# Patient Record
Sex: Female | Born: 1965 | Race: White | Hispanic: No | Marital: Married | Smoking: Current every day smoker
Health system: Southern US, Community
[De-identification: ages and names within clinical notes are randomized; demographics above are authoritative.]

## PROBLEM LIST (undated history)

## (undated) DIAGNOSIS — K5090 Crohn's disease, unspecified, without complications: Secondary | ICD-10-CM

## (undated) DIAGNOSIS — F329 Major depressive disorder, single episode, unspecified: Secondary | ICD-10-CM

## (undated) DIAGNOSIS — M1990 Unspecified osteoarthritis, unspecified site: Secondary | ICD-10-CM

## (undated) DIAGNOSIS — H3530 Unspecified macular degeneration: Secondary | ICD-10-CM

## (undated) DIAGNOSIS — E039 Hypothyroidism, unspecified: Secondary | ICD-10-CM

## (undated) DIAGNOSIS — F4310 Post-traumatic stress disorder, unspecified: Secondary | ICD-10-CM

## (undated) DIAGNOSIS — E079 Disorder of thyroid, unspecified: Secondary | ICD-10-CM

## (undated) DIAGNOSIS — I10 Essential (primary) hypertension: Secondary | ICD-10-CM

## (undated) DIAGNOSIS — Z87442 Personal history of urinary calculi: Secondary | ICD-10-CM

## (undated) DIAGNOSIS — F32A Depression, unspecified: Secondary | ICD-10-CM

## (undated) DIAGNOSIS — E785 Hyperlipidemia, unspecified: Secondary | ICD-10-CM

## (undated) DIAGNOSIS — F419 Anxiety disorder, unspecified: Secondary | ICD-10-CM

## (undated) HISTORY — DX: Hyperlipidemia, unspecified: E78.5

## (undated) HISTORY — PX: ABDOMINAL HYSTERECTOMY: SHX81

## (undated) HISTORY — PX: CHOLECYSTECTOMY: SHX55

## (undated) HISTORY — PX: JOINT REPLACEMENT: SHX530

## (undated) HISTORY — DX: Essential (primary) hypertension: I10

## (undated) HISTORY — DX: Disorder of thyroid, unspecified: E07.9

## (undated) HISTORY — PX: APPENDECTOMY: SHX54

## (undated) HISTORY — DX: Unspecified macular degeneration: H35.30

## (undated) HISTORY — DX: Anxiety disorder, unspecified: F41.9

## (undated) HISTORY — PX: MEDIAL PARTIAL KNEE REPLACEMENT: SHX5965

## (undated) HISTORY — PX: EYE SURGERY: SHX253

---
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
## 2017-06-02 ENCOUNTER — Other Ambulatory Visit (HOSPITAL_COMMUNITY): Payer: Self-pay | Admitting: *Deleted

## 2017-06-02 ENCOUNTER — Ambulatory Visit (HOSPITAL_COMMUNITY)
Admission: RE | Admit: 2017-06-02 | Discharge: 2017-06-02 | Disposition: A | Discharge status: Home / Self Care | Payer: Self-pay | Source: Ambulatory Visit | Attending: *Deleted | Admitting: *Deleted

## 2017-06-02 DIAGNOSIS — W19XXXD Unspecified fall, subsequent encounter: Secondary | ICD-10-CM

## 2017-06-02 DIAGNOSIS — Y92009 Unspecified place in unspecified non-institutional (private) residence as the place of occurrence of the external cause: Secondary | ICD-10-CM | POA: Insufficient documentation

## 2017-06-02 DIAGNOSIS — W19XXXA Unspecified fall, initial encounter: Secondary | ICD-10-CM | POA: Insufficient documentation

## 2017-06-02 DIAGNOSIS — M25562 Pain in left knee: Secondary | ICD-10-CM | POA: Insufficient documentation

## 2017-06-02 DIAGNOSIS — Z96652 Presence of left artificial knee joint: Secondary | ICD-10-CM | POA: Insufficient documentation

## 2017-11-30 ENCOUNTER — Other Ambulatory Visit (HOSPITAL_COMMUNITY): Payer: Self-pay | Admitting: *Deleted

## 2017-11-30 DIAGNOSIS — E039 Hypothyroidism, unspecified: Secondary | ICD-10-CM

## 2017-12-05 ENCOUNTER — Ambulatory Visit (HOSPITAL_COMMUNITY)
Admission: RE | Admit: 2017-12-05 | Discharge: 2017-12-05 | Disposition: A | Discharge status: Home / Self Care | Payer: Self-pay | Source: Ambulatory Visit | Attending: *Deleted | Admitting: *Deleted

## 2017-12-05 DIAGNOSIS — E039 Hypothyroidism, unspecified: Secondary | ICD-10-CM | POA: Insufficient documentation

## 2018-01-31 ENCOUNTER — Encounter: Payer: Self-pay | Admitting: Internal Medicine

## 2018-02-13 ENCOUNTER — Encounter: Payer: Self-pay | Admitting: Gastroenterology

## 2018-02-13 ENCOUNTER — Ambulatory Visit (INDEPENDENT_AMBULATORY_CARE_PROVIDER_SITE_OTHER): Payer: Self-pay | Admitting: Gastroenterology

## 2018-02-13 DIAGNOSIS — K5090 Crohn's disease, unspecified, without complications: Secondary | ICD-10-CM

## 2018-02-13 NOTE — Assessment & Plan Note (Signed)
Very pleasant 52 year old female presenting for further management of Crohn's disease.  She reports being diagnosed with small bowel Crohn's disease about 13 years ago while living in [place].  She reports last colonoscopy about 3 years ago along with an EGD at the time.  She states it was not being done related to her Crohn's but she was found to have acid reflux and colon polyps.  She has mostly constipation with periods of diarrhea, intermittent vomiting, abdominal pain and bloating, rectal bleeding.  We have requested records from her last EGD and colonoscopy in [place].  She cannot recall where she was when she had the original ones done in [place] therefore these records are not going to be available.  Requested recent labs from the health department for review.  Once records have been reviewed, further recommendations to follow.

## 2018-02-13 NOTE — Patient Instructions (Signed)
1. We will work on getting your records. If you have not heard from our office by Thursday, please call and inquire, [phone].

## 2018-02-13 NOTE — Progress Notes (Signed)
cc'd to pcp 

## 2018-02-13 NOTE — Progress Notes (Signed)
Primary Care Physician:  Raiford Simmonds., PA-C  Primary Gastroenterologist:  Garfield Cornea, MD   Chief Complaint  Patient presents with  . Crohn's Disease    f/u.  Marland Kitchen diarrhea/constipation    varies  . Abdominal Pain    feels very bloated, upper abd    HPI:  Victoria Deleon is a 51 y.o. female here at the request of Royce Macadamia PA-C for further management of Crohn's disease.  Patient presents with her husband today.  They are fairly new to the area.  She reports being diagnosed with small bowel Crohn's disease about 13 years ago while in [place].  She said she had a colonoscopy and her small bowel look like "hamburger meat".  She states she was going to be started on occasion for Crohn's but she was scared to take it after a nurse advised against it and recommended aloe vera juice.  Patient reports changing her diet, consuming aloe vera juice and having repeat colonoscopy which showed improvement of the small bowel mucosa.  She continued this for years but over the past few years has had difficult to manage disease.  She reports having an EGD and colonoscopy about 3 years ago in [place], [place].  Says she had some colon polyps removed, advised to come back for colonoscopy in 5 years.  Also with acid reflux.  She states for several years she has been just dealing with the symptoms.  She has had lack of medical coverage which is limited her seeking care.  Complains of alternating constipation and diarrhea.  Mostly constipation.  However cites multiple times during the visit that she is limited as far as her activities due to the diarrhea.  She has a bowel movement about twice per week, last week had to use an enema.  Takes fiber daily.  Sometimes bright red blood per rectum.  No melena.  Complains of frequent severe reflux.  Has postprandial abdominal bloating.  Complains of doubling over type pain in the abdomen worse over the past year.  No weight loss.  She is vegan.  She notes if she  eats gluten she tends to pay for it.  Off of liquid diet.  She has vomiting about 4-5 times per month but more frequent nausea.  She is not sure if she is ever been screened for celiac disease.  She does not know her family history as she was adopted.  She admits to significant anxiety which prevents her from doing some of the activities she would like to do such as driving.   Current Outpatient Medications  Medication Sig Dispense Refill  . atorvastatin (LIPITOR) 40 MG tablet Take 1 tablet by mouth daily.  6  . busPIRone (BUSPAR) 10 MG tablet Take 10 mg by mouth 3 (three) times daily.  3  . citalopram (CELEXA) 20 MG tablet Take 20 mg by mouth daily.  3  . doxepin (SINEQUAN) 10 MG capsule Take 10 mg by mouth daily.  3  . FIBER PO Take by mouth daily.    Marland Kitchen levothyroxine (SYNTHROID, LEVOTHROID) 150 MCG tablet Take 1 tablet by mouth daily.  2  . Multiple Vitamins-Minerals (OCUVITE-LUTEIN PO) Take by mouth daily.    . polyethylene glycol (MIRALAX / GLYCOLAX) packet Take 17 g by mouth daily.     No current facility-administered medications for this visit.     Allergies as of 02/13/2018 - Review Complete 02/13/2018  Allergen Reaction Noted  . Codeine Anaphylaxis 06/02/2017    Past Medical History:  Diagnosis Date  . Anxiety   . Hypertension   . Macular degeneration     Past Surgical History:  Procedure Laterality Date  . ABDOMINAL HYSTERECTOMY    . APPENDECTOMY    . CHOLECYSTECTOMY    . MEDIAL PARTIAL KNEE REPLACEMENT Left     Family History  Adopted: Yes    Social History   Socioeconomic History  . Marital status: Married    Spouse name: Not on file  . Number of children: Not on file  . Years of education: Not on file  . Highest education level: Not on file  Occupational History  . Not on file  Social Needs  . Financial resource strain: Not on file  . Food insecurity:    Worry: Not on file    Inability: Not on file  . Transportation needs:    Medical: Not on  file    Non-medical: Not on file  Tobacco Use  . Smoking status: Current Every Day Smoker    Types: Cigarettes  . Smokeless tobacco: Never Used  Substance and Sexual Activity  . Alcohol use: Never    Frequency: Never  . Drug use: Never  . Sexual activity: Not on file  Lifestyle  . Physical activity:    Days per week: Not on file    Minutes per session: Not on file  . Stress: Not on file  Relationships  . Social connections:    Talks on phone: Not on file    Gets together: Not on file    Attends religious service: Not on file    Active member of club or organization: Not on file    Attends meetings of clubs or organizations: Not on file    Relationship status: Not on file  . Intimate partner violence:    Fear of current or ex partner: Not on file    Emotionally abused: Not on file    Physically abused: Not on file    Forced sexual activity: Not on file  Other Topics Concern  . Not on file  Social History Narrative  . Not on file      ROS:  General: Negative for  weight loss, fever, chills, fatigue, weakness. See hpi Eyes: Negative for vision changes.  ENT: Negative for hoarseness, difficulty swallowing , nasal congestion. CV: Negative for chest pain, angina, palpitations, dyspnea on exertion, peripheral edema.  Respiratory: Negative for dyspnea at rest, dyspnea on exertion, cough, sputum, wheezing.  GI: See history of present illness. GU:  Negative for dysuria, hematuria, urinary incontinence, urinary frequency, nocturnal urination.  MS: Negative for joint pain, low back pain.  Derm: Negative for rash or itching.  Neuro: Negative for weakness, abnormal sensation, seizure, frequent headaches, memory loss, confusion.  Psych: Negative for anxiety, depression, suicidal ideation, hallucinations.  Endo: Negative for unusual weight change.  Heme: Negative for bruising or bleeding. Allergy: Negative for rash or hives.    Physical Examination:  BP 125/77   Pulse 66    Temp (!) 97 F (36.1 C) (Oral)   Ht 5' 1"  (1.549 m)   Wt 170 lb 12.8 oz (77.5 kg)   BMI 32.27 kg/m    General: Well-nourished, well-developed in no acute distress.  Head: Normocephalic, atraumatic.   Eyes: Conjunctiva pink, no icterus. Mouth: Oropharyngeal mucosa moist and pink , no lesions erythema or exudate. Neck: Supple without thyromegaly, masses, or lymphadenopathy.  Lungs: Clear to auscultation bilaterally.  Heart: Regular rate and rhythm, no murmurs rubs or gallops.  Abdomen:  Bowel sounds are normal, tender in right abd /epig region, nondistended, no hepatosplenomegaly or masses, no abdominal bruits or    hernia , no rebound or guarding.   Rectal: not performed Extremities: No lower extremity edema. No clubbing or deformities.  Neuro: Alert and oriented x 4 , grossly normal neurologically.  Skin: Warm and dry, no rash or jaundice.   Psych: Alert and cooperative, normal mood and affect.  Labs: Requested.   Imaging Studies: No results found.

## 2018-02-17 ENCOUNTER — Telehealth: Payer: Self-pay | Admitting: Gastroenterology

## 2018-02-17 DIAGNOSIS — R197 Diarrhea, unspecified: Secondary | ICD-10-CM

## 2018-02-17 DIAGNOSIS — K5090 Crohn's disease, unspecified, without complications: Secondary | ICD-10-CM

## 2018-02-17 DIAGNOSIS — R1110 Vomiting, unspecified: Secondary | ICD-10-CM

## 2018-02-17 DIAGNOSIS — R109 Unspecified abdominal pain: Secondary | ICD-10-CM

## 2018-02-17 NOTE — Telephone Encounter (Signed)
Received records from the health department. CBC, TSH, vitamin B12.  POOR COPY COULD NOT READ.  1. Please request better copy.  2. Also I'm waiting on records from [place], [place].  3. Would offer update labs: CMET, CBC, TTG IgA, serum IgA 4. CT A/P with contrast for h/o crohn's, abdominal pain, vomiting, diarrhea

## 2018-02-21 NOTE — Telephone Encounter (Signed)
Tried calling the health department several times today and wasn't able to reach anyone. Will try again.

## 2018-02-23 ENCOUNTER — Telehealth: Payer: Self-pay | Admitting: Internal Medicine

## 2018-02-23 ENCOUNTER — Other Ambulatory Visit: Payer: Self-pay

## 2018-02-23 DIAGNOSIS — K50118 Crohn's disease of large intestine with other complication: Secondary | ICD-10-CM

## 2018-02-23 NOTE — Telephone Encounter (Signed)
Pt called this morning saying that she had seen LSL on 9/16 and we were going to try to get records on her and she was to call to follow up on what LSL wanted to do next. Please advise [phone]

## 2018-02-23 NOTE — Telephone Encounter (Signed)
CT a/p scheduled for 03/10/18 Friday at 5:00pm, arrival time 4:45pm, npo hrs prior, pick up oral contrast before appt  Patient aware of appt details.

## 2018-02-23 NOTE — Telephone Encounter (Signed)
Spoke with the health department. They are faxing notes over. New lab orders will be faxed to them at [phone]. Pt has an appointment for Monday 02/27/18 and they will drawn labs them. Notes will be placed on LSL desk.   Please order CT A/P with contrast for h/o crohn's, abdominal pain, vomiting, diarrhea.

## 2018-02-23 NOTE — Addendum Note (Signed)
Addended by: Inge Rise on: 02/23/2018 12:12 PM   Modules accepted: Orders

## 2018-02-24 ENCOUNTER — Other Ambulatory Visit: Payer: Self-pay

## 2018-02-24 NOTE — Progress Notes (Signed)
Opened in error

## 2018-03-01 ENCOUNTER — Telehealth: Payer: Self-pay | Admitting: Gastroenterology

## 2018-03-01 NOTE — Telephone Encounter (Signed)
Records received from the health department Labs dated 01/16/2018: Copy difficult to read however her white blood cell count was within normal limits.  Hemoglobin 15.3, platelets within normal limits.  TSH 0.16 B12 673  Labs dated 02/27/2018: Creatinine 0.83, sodium 138, total bilirubin 0.6, alkaline phosphatase 58, AST 38, ALT 16, albumin 4.4, white blood cell count 5500, hemoglobin 15.5, platelets 283,000, IgA 43 slightly low with lower limit normal 47, TTG, IgA 1  Path from 01/2016 rectal biopsy, hyperplastic polyp. No op note received.   Labs and CT as planned. Further recommendations to follow.

## 2018-03-10 ENCOUNTER — Ambulatory Visit (HOSPITAL_COMMUNITY)
Admission: RE | Admit: 2018-03-10 | Discharge: 2018-03-10 | Disposition: A | Discharge status: Home / Self Care | Payer: Self-pay | Source: Ambulatory Visit | Attending: Gastroenterology | Admitting: Gastroenterology

## 2018-03-10 DIAGNOSIS — K5090 Crohn's disease, unspecified, without complications: Secondary | ICD-10-CM | POA: Insufficient documentation

## 2018-03-10 DIAGNOSIS — R1110 Vomiting, unspecified: Secondary | ICD-10-CM | POA: Insufficient documentation

## 2018-03-10 DIAGNOSIS — N200 Calculus of kidney: Secondary | ICD-10-CM | POA: Insufficient documentation

## 2018-03-10 DIAGNOSIS — R197 Diarrhea, unspecified: Secondary | ICD-10-CM | POA: Insufficient documentation

## 2018-03-10 DIAGNOSIS — R109 Unspecified abdominal pain: Secondary | ICD-10-CM | POA: Insufficient documentation

## 2018-03-10 MED ORDER — IOPAMIDOL (ISOVUE-300) INJECTION 61%
100.0000 mL | Freq: Once | INTRAVENOUS | Status: AC | PRN
  Administered 2018-03-10: 100 mL via INTRAVENOUS

## 2018-03-13 NOTE — Telephone Encounter (Signed)
Let's be more aggressive with management of constipation.  Start Linzess 15mg daily before breakfast. #30, 3 refills. Give rebate card if eligible.  TCS/EGD with propofol as outlined.

## 2018-03-13 NOTE — Telephone Encounter (Signed)
Lmom, waiting on a return call.  

## 2018-03-13 NOTE — Telephone Encounter (Signed)
Please let patient know that we were waiting on the CT prior to reaching out with labs etc.   1. Her CT from Friday showed tiny nonobstructive stone in the right kidney. Moderate amount of stool throughout the colon. No evidence of active colitis.  2. Her labs from 02/27/18: celiac screen negative. No anemia, LFTs and kidney function unremarkable. See below for specifics.  3. We were unsuccessful in obtaining out of state op notes from [place], [place], we received only a path report.  4. Given her symptoms of frequent N/V, upper abd bloating, frequent severe reflux, H/O chronic Crohn's, brbpr I would recommend updating TCS/EGD WITH PROPOFOL WITH RMR at this time.  5. Find out what her bowel regimen is: her med list states miralax and fiber. I need to know if she takes every day. Is she still having constipation, her CT showed a lot of stool.

## 2018-03-13 NOTE — Telephone Encounter (Signed)
Patient called wanting to know the results from her labs she had drawn at the [facility].  She also had her ct scan done on Friday, 10/11.  She can be reached at [phone].

## 2018-03-13 NOTE — Telephone Encounter (Signed)
Pt notified of results. Pt does still deal with constipation daily. She takes Miralax twice daily and feels it takes 3 days to move. Fiber tablets are taken daily and she eats lots of veggies and fruit being that she's vegetarian.   Please schedule TCS EGD with Propofol with RMR per LSL

## 2018-03-14 ENCOUNTER — Other Ambulatory Visit: Payer: Self-pay

## 2018-03-14 DIAGNOSIS — K625 Hemorrhage of anus and rectum: Secondary | ICD-10-CM

## 2018-03-14 DIAGNOSIS — K219 Gastro-esophageal reflux disease without esophagitis: Secondary | ICD-10-CM

## 2018-03-14 DIAGNOSIS — R112 Nausea with vomiting, unspecified: Secondary | ICD-10-CM

## 2018-03-14 DIAGNOSIS — Z8719 Personal history of other diseases of the digestive system: Secondary | ICD-10-CM

## 2018-03-14 DIAGNOSIS — R140 Abdominal distension (gaseous): Secondary | ICD-10-CM

## 2018-03-14 NOTE — Telephone Encounter (Signed)
Called and informed pt of pre-op appt 04/18/18 at 10:00am. Appt letter included with procedure instructions.

## 2018-03-14 NOTE — Telephone Encounter (Signed)
Spoke to pt yesterday afternoon, TCS/EGD w/Propofol w/RMR scheduled for 04/24/18 at 10:30am. She will come by office to pick up sample prep (pt has no insurance) and instructions. Orders entered.

## 2018-04-14 NOTE — Patient Instructions (Signed)
Victoria Deleon  04/14/2018     @PREFPERIOPPHARMACY @   Your procedure is scheduled on  04/24/2018 .  Report to [facility] at  915   A.M.  Call this number if you have problems the morning of surgery:  [phone]   Remember:  Follow the diet and prep instructions given to you by Dr Roseanne Kaufman office.                       Take these medicines the morning of surgery with A SIP OF WATER  Byspar, celexa, doxepin, levothyroxine.    Do not wear jewelry, make-up or nail polish.  Do not wear lotions, powders, or perfumes, or deodorant.  Do not shave 48 hours prior to surgery.  Men may shave face and neck.  Do not bring valuables to the hospital.  [facility] is not responsible for any belongings or valuables.  Contacts, dentures or bridgework may not be worn into surgery.  Leave your suitcase in the car.  After surgery it may be brought to your room.  For patients admitted to the hospital, discharge time will be determined by your treatment team.  Patients discharged the day of surgery will not be allowed to drive home.   Name and phone number of your driver:   family Special instructions:  None  Please read over the following fact sheets that you were given. Anesthesia Post-op Instructions and Care and Recovery After Surgery       Esophagogastroduodenoscopy Esophagogastroduodenoscopy (EGD) is a procedure to examine the lining of the esophagus, stomach, and first part of the small intestine (duodenum). This procedure is done to check for problems such as inflammation, bleeding, ulcers, or growths. During this procedure, a long, flexible, lighted tube with a camera attached (endoscope) is inserted down the throat. Tell a health care provider about:  Any allergies you have.  All medicines you are taking, including vitamins, herbs, eye drops, creams, and over-the-counter medicines.  Any problems you or family members have had with anesthetic medicines.  Any  blood disorders you have.  Any surgeries you have had.  Any medical conditions you have.  Whether you are pregnant or may be pregnant. What are the risks? Generally, this is a safe procedure. However, problems may occur, including:  Infection.  Bleeding.  A tear (perforation) in the esophagus, stomach, or duodenum.  Trouble breathing.  Excessive sweating.  Spasms of the larynx.  A slowed heartbeat.  Low blood pressure.  What happens before the procedure?  Follow instructions from your health care provider about eating or drinking restrictions.  Ask your health care provider about: ? Changing or stopping your regular medicines. This is especially important if you are taking diabetes medicines or blood thinners. ? Taking medicines such as aspirin and ibuprofen. These medicines can thin your blood. Do not take these medicines before your procedure if your health care provider instructs you not to.  Plan to have someone take you home after the procedure.  If you wear dentures, be ready to remove them before the procedure. What happens during the procedure?  To reduce your risk of infection, your health care team will wash or sanitize their hands.  An IV tube will be put in a vein in your hand or arm. You will get medicines and fluids through this tube.  You will be given one or more of the following: ? A medicine to help  you relax (sedative). ? A medicine to numb the area (local anesthetic). This medicine may be sprayed into your throat. It will make you feel more comfortable and keep you from gagging or coughing during the procedure. ? A medicine for pain.  A mouth guard may be placed in your mouth to protect your teeth and to keep you from biting on the endoscope.  You will be asked to lie on your left side.  The endoscope will be lowered down your throat into your esophagus, stomach, and duodenum.  Air will be put into the endoscope. This will help your health  care provider see better.  The lining of your esophagus, stomach, and duodenum will be examined.  Your health care provider may: ? Take a tissue sample so it can be looked at in a lab (biopsy). ? Remove growths. ? Remove objects (foreign bodies) that are stuck. ? Treat any bleeding with medicines or other devices that stop tissue from bleeding. ? Widen (dilate) or stretch narrowed areas of your esophagus and stomach.  The endoscope will be taken out. The procedure may vary among health care providers and hospitals. What happens after the procedure?  Your blood pressure, heart rate, breathing rate, and blood oxygen level will be monitored often until the medicines you were given have worn off.  Do not eat or drink anything until the numbing medicine has worn off and your gag reflex has returned. This information is not intended to replace advice given to you by your health care provider. Make sure you discuss any questions you have with your health care provider. Document Released: 09/17/2004 Document Revised: 10/23/2015 Document Reviewed: 04/10/2015 Elsevier Interactive Patient Education  2018 [employer]. Esophagogastroduodenoscopy, Care After Refer to this sheet in the next few weeks. These instructions provide you with information about caring for yourself after your procedure. Your health care provider may also give you more specific instructions. Your treatment has been planned according to current medical practices, but problems sometimes occur. Call your health care provider if you have any problems or questions after your procedure. What can I expect after the procedure? After the procedure, it is common to have:  A sore throat.  Nausea.  Bloating.  Dizziness.  Fatigue.  Follow these instructions at home:  Do not eat or drink anything until the numbing medicine (local anesthetic) has worn off and your gag reflex has returned. You will know that the local anesthetic has  worn off when you can swallow comfortably.  Do not drive for 24 hours if you received a medicine to help you relax (sedative).  If your health care provider took a tissue sample for testing during the procedure, make sure to get your test results. This is your responsibility. Ask your health care provider or the department performing the test when your results will be ready.  Keep all follow-up visits as told by your health care provider. This is important. Contact a health care provider if:  You cannot stop coughing.  You are not urinating.  You are urinating less than usual. Get help right away if:  You have trouble swallowing.  You cannot eat or drink.  You have throat or chest pain that gets worse.  You are dizzy or light-headed.  You faint.  You have nausea or vomiting.  You have chills.  You have a fever.  You have severe abdominal pain.  You have black, tarry, or bloody stools. This information is not intended to replace advice given to  you by your health care provider. Make sure you discuss any questions you have with your health care provider. Document Released: 05/03/2012 Document Revised: 10/23/2015 Document Reviewed: 04/10/2015 Elsevier Interactive Patient Education  2018 [employer].  Colonoscopy, Adult A colonoscopy is an exam to look at the large intestine. It is done to check for problems, such as:  Lumps (tumors).  Growths (polyps).  Swelling (inflammation).  Bleeding.  What happens before the procedure? Eating and drinking Follow instructions from your doctor about eating and drinking. These instructions may include:  A few days before the procedure - follow a low-fiber diet. ? Avoid nuts. ? Avoid seeds. ? Avoid dried fruit. ? Avoid raw fruits. ? Avoid vegetables.  1-3 days before the procedure - follow a clear liquid diet. Avoid liquids that have red or purple dye. Drink only clear liquids, such as: ? Clear broth or bouillon. ? Black  coffee or tea. ? Clear juice. ? Clear soft drinks or sports drinks. ? Gelatin dessert. ? Popsicles.  On the day of the procedure - do not eat or drink anything during the 2 hours before the procedure.  Bowel prep If you were prescribed an oral bowel prep:  Take it as told by your doctor. Starting the day before your procedure, you will need to drink a lot of liquid. The liquid will cause you to poop (have bowel movements) until your poop is almost clear or light green.  If your skin or butt gets irritated from diarrhea, you may: ? Wipe the area with wipes that have medicine in them, such as adult wet wipes with aloe and vitamin E. ? Put something on your skin that soothes the area, such as petroleum jelly.  If you throw up (vomit) while drinking the bowel prep, take a break for up to 60 minutes. Then begin the bowel prep again. If you keep throwing up and you cannot take the bowel prep without throwing up, call your doctor.  General instructions  Ask your doctor about changing or stopping your normal medicines. This is important if you take diabetes medicines or blood thinners.  Plan to have someone take you home from the hospital or clinic. What happens during the procedure?  An IV tube may be put into one of your veins.  You will be given medicine to help you relax (sedative).  To reduce your risk of infection: ? Your doctors will wash their hands. ? Your anal area will be washed with soap.  You will be asked to lie on your side with your knees bent.  Your doctor will get a long, thin, flexible tube ready. The tube will have a camera and a light on the end.  The tube will be put into your anus.  The tube will be gently put into your large intestine.  Air will be delivered into your large intestine to keep it open. You may feel some pressure or cramping.  The camera will be used to take photos.  A small tissue sample may be removed from your body to be looked at under a  microscope (biopsy). If any possible problems are found, the tissue will be sent to a lab for testing.  If small growths are found, your doctor may remove them and have them checked for cancer.  The tube that was put into your anus will be slowly removed. The procedure may vary among doctors and hospitals. What happens after the procedure?  Your doctor will check on you often until  the medicines you were given have worn off.  Do not drive for 24 hours after the procedure.  You may have a small amount of blood in your poop.  You may pass gas.  You may have mild cramps or bloating in your belly (abdomen).  It is up to you to get the results of your procedure. Ask your doctor, or the department performing the procedure, when your results will be ready. This information is not intended to replace advice given to you by your health care provider. Make sure you discuss any questions you have with your health care provider. Document Released: 06/19/2010 Document Revised: 03/17/2016 Document Reviewed: 07/29/2015 Elsevier Interactive Patient Education  2017 [employer]  Colonoscopy, Adult, Care After This sheet gives you information about how to care for yourself after your procedure. Your health care provider may also give you more specific instructions. If you have problems or questions, contact your health care provider. What can I expect after the procedure? After the procedure, it is common to have:  A small amount of blood in your stool for 24 hours after the procedure.  Some gas.  Mild abdominal cramping or bloating.  Follow these instructions at home: General instructions   For the first 24 hours after the procedure: ? Do not drive or use machinery. ? Do not sign important documents. ? Do not drink alcohol. ? Do your regular daily activities at a slower pace than normal. ? Eat soft, easy-to-digest foods. ? Rest often.  Take over-the-counter or prescription medicines  only as told by your health care provider.  It is up to you to get the results of your procedure. Ask your health care provider, or the department performing the procedure, when your results will be ready. Relieving cramping and bloating  Try walking around when you have cramps or feel bloated.  Apply heat to your abdomen as told by your health care provider. Use a heat source that your health care provider recommends, such as a moist heat pack or a heating pad. ? Place a towel between your skin and the heat source. ? Leave the heat on for 20-30 minutes. ? Remove the heat if your skin turns bright red. This is especially important if you are unable to feel pain, heat, or cold. You may have a greater risk of getting burned. Eating and drinking  Drink enough fluid to keep your urine clear or pale yellow.  Resume your normal diet as instructed by your health care provider. Avoid heavy or fried foods that are hard to digest.  Avoid drinking alcohol for as long as instructed by your health care provider. Contact a health care provider if:  You have blood in your stool 2-3 days after the procedure. Get help right away if:  You have more than a small spotting of blood in your stool.  You pass large blood clots in your stool.  Your abdomen is swollen.  You have nausea or vomiting.  You have a fever.  You have increasing abdominal pain that is not relieved with medicine. This information is not intended to replace advice given to you by your health care provider. Make sure you discuss any questions you have with your health care provider. Document Released: 12/30/2003 Document Revised: 02/09/2016 Document Reviewed: 07/29/2015 Elsevier Interactive Patient Education  2018 [place] Anesthesia is a term that refers to techniques, procedures, and medicines that help a person stay safe and comfortable during a medical procedure. Monitored  anesthesia care, or  sedation, is one type of anesthesia. Your anesthesia specialist may recommend sedation if you will be having a procedure that does not require you to be unconscious, such as:  Cataract surgery.  A dental procedure.  A biopsy.  A colonoscopy.  During the procedure, you may receive a medicine to help you relax (sedative). There are three levels of sedation:  Mild sedation. At this level, you may feel awake and relaxed. You will be able to follow directions.  Moderate sedation. At this level, you will be sleepy. You may not remember the procedure.  Deep sedation. At this level, you will be asleep. You will not remember the procedure.  The more medicine you are given, the deeper your level of sedation will be. Depending on how you respond to the procedure, the anesthesia specialist may change your level of sedation or the type of anesthesia to fit your needs. An anesthesia specialist will monitor you closely during the procedure. Let your health care provider know about:  Any allergies you have.  All medicines you are taking, including vitamins, herbs, eye drops, creams, and over-the-counter medicines.  Any use of steroids (by mouth or as a cream).  Any problems you or family members have had with sedatives and anesthetic medicines.  Any blood disorders you have.  Any surgeries you have had.  Any medical conditions you have, such as sleep apnea.  Whether you are pregnant or may be pregnant.  Any use of cigarettes, alcohol, or street drugs. What are the risks? Generally, this is a safe procedure. However, problems may occur, including:  Getting too much medicine (oversedation).  Nausea.  Allergic reaction to medicines.  Trouble breathing. If this happens, a breathing tube may be used to help with breathing. It will be removed when you are awake and breathing on your own.  Heart trouble.  Lung trouble.  Before the procedure Staying hydrated Follow instructions from  your health care provider about hydration, which may include:  Up to 2 hours before the procedure - you may continue to drink clear liquids, such as water, clear fruit juice, black coffee, and plain tea.  Eating and drinking restrictions Follow instructions from your health care provider about eating and drinking, which may include:  8 hours before the procedure - stop eating heavy meals or foods such as meat, fried foods, or fatty foods.  6 hours before the procedure - stop eating light meals or foods, such as toast or cereal.  6 hours before the procedure - stop drinking milk or drinks that contain milk.  2 hours before the procedure - stop drinking clear liquids.  Medicines Ask your health care provider about:  Changing or stopping your regular medicines. This is especially important if you are taking diabetes medicines or blood thinners.  Taking medicines such as aspirin and ibuprofen. These medicines can thin your blood. Do not take these medicines before your procedure if your health care provider instructs you not to.  Tests and exams  You will have a physical exam.  You may have blood tests done to show: ? How well your kidneys and liver are working. ? How well your blood can clot.  General instructions  Plan to have someone take you home from the hospital or clinic.  If you will be going home right after the procedure, plan to have someone with you for 24 hours.  What happens during the procedure?  Your blood pressure, heart rate, breathing, level of pain  and overall condition will be monitored.  An IV tube will be inserted into one of your veins.  Your anesthesia specialist will give you medicines as needed to keep you comfortable during the procedure. This may mean changing the level of sedation.  The procedure will be performed. After the procedure  Your blood pressure, heart rate, breathing rate, and blood oxygen level will be monitored until the medicines  you were given have worn off.  Do not drive for 24 hours if you received a sedative.  You may: ? Feel sleepy, clumsy, or nauseous. ? Feel forgetful about what happened after the procedure. ? Have a sore throat if you had a breathing tube during the procedure. ? Vomit. This information is not intended to replace advice given to you by your health care provider. Make sure you discuss any questions you have with your health care provider. Document Released: 02/10/2005 Document Revised: 10/24/2015 Document Reviewed: 09/07/2015 Elsevier Interactive Patient Education  2018 [place], Care After These instructions provide you with information about caring for yourself after your procedure. Your health care provider may also give you more specific instructions. Your treatment has been planned according to current medical practices, but problems sometimes occur. Call your health care provider if you have any problems or questions after your procedure. What can I expect after the procedure? After your procedure, it is common to:  Feel sleepy for several hours.  Feel clumsy and have poor balance for several hours.  Feel forgetful about what happened after the procedure.  Have poor judgment for several hours.  Feel nauseous or vomit.  Have a sore throat if you had a breathing tube during the procedure.  Follow these instructions at home: For at least 24 hours after the procedure:   Do not: ? Participate in activities in which you could fall or become injured. ? Drive. ? Use heavy machinery. ? Drink alcohol. ? Take sleeping pills or medicines that cause drowsiness. ? Make important decisions or sign legal documents. ? Take care of children on your own.  Rest. Eating and drinking  Follow the diet that is recommended by your health care provider.  If you vomit, drink water, juice, or soup when you can drink without vomiting.  Make sure you have little  or no nausea before eating solid foods. General instructions  Have a responsible adult stay with you until you are awake and alert.  Take over-the-counter and prescription medicines only as told by your health care provider.  If you smoke, do not smoke without supervision.  Keep all follow-up visits as told by your health care provider. This is important. Contact a health care provider if:  You keep feeling nauseous or you keep vomiting.  You feel light-headed.  You develop a rash.  You have a fever. Get help right away if:  You have trouble breathing. This information is not intended to replace advice given to you by your health care provider. Make sure you discuss any questions you have with your health care provider. Document Released: 09/07/2015 Document Revised: 01/07/2016 Document Reviewed: 09/07/2015 Elsevier Interactive Patient Education  [employer].

## 2018-04-18 ENCOUNTER — Encounter (HOSPITAL_COMMUNITY): Payer: Self-pay

## 2018-04-18 ENCOUNTER — Other Ambulatory Visit: Payer: Self-pay

## 2018-04-18 ENCOUNTER — Encounter (HOSPITAL_COMMUNITY)
Admission: RE | Admit: 2018-04-18 | Discharge: 2018-04-18 | Disposition: A | Discharge status: Home / Self Care | Payer: Self-pay | Source: Ambulatory Visit | Attending: Internal Medicine | Admitting: Internal Medicine

## 2018-04-18 DIAGNOSIS — Z01818 Encounter for other preprocedural examination: Secondary | ICD-10-CM | POA: Insufficient documentation

## 2018-04-18 HISTORY — DX: Personal history of urinary calculi: Z87.442

## 2018-04-18 HISTORY — DX: Hypothyroidism, unspecified: E03.9

## 2018-04-18 HISTORY — DX: Crohn's disease, unspecified, without complications: K50.90

## 2018-04-18 LAB — COMPREHENSIVE METABOLIC PANEL
ALBUMIN: 4.3 g/dL (ref 3.5–5.0)
ALT: 19 U/L (ref 0–44)
AST: 20 U/L (ref 15–41)
Alkaline Phosphatase: 54 U/L (ref 38–126)
Anion gap: 9 (ref 5–15)
BUN: 14 mg/dL (ref 6–20)
CHLORIDE: 104 mmol/L (ref 98–111)
CO2: 24 mmol/L (ref 22–32)
CREATININE: 0.79 mg/dL (ref 0.44–1.00)
Calcium: 9.5 mg/dL (ref 8.9–10.3)
GFR calc Af Amer: >60 mL/min (ref >60)
GLUCOSE: 99 mg/dL (ref 70–99)
POTASSIUM: 4 mmol/L (ref 3.5–5.1)
Sodium: 137 mmol/L (ref 135–145)
Total Bilirubin: 0.5 mg/dL (ref 0.3–1.2)
Total Protein: 7.1 g/dL (ref 6.5–8.1)

## 2018-04-19 ENCOUNTER — Telehealth: Payer: Self-pay | Admitting: Internal Medicine

## 2018-04-19 LAB — TISSUE TRANSGLUTAMINASE, IGA

## 2018-04-19 LAB — IGA: IGA: 45 mg/dL — AB (ref 87–352)

## 2018-04-19 NOTE — Telephone Encounter (Signed)
Patient aware she is still on to have procedure

## 2018-04-19 NOTE — Telephone Encounter (Signed)
Per EKG result note:  Notes recorded by Derrick Ravel, CMA on [id number] at 4:33 PM EST Pt notified. Please send a copy to pt PCP. ------  Notes recorded by Daneil Dolin, MD on 04/18/2018 at 1:40 PM EST Abnormal EKG of uncertain significance ordered by anesthesia. Send a copy to PCP for continuity of care. -----  LMOVM for pt.

## 2018-04-19 NOTE — Telephone Encounter (Signed)
Pt went to her pre op yesterday and was told her EKG was off and she doesn't know if she is to still have her procedure with [facility] on Monday. Please advise and call her at [phone]

## 2018-04-20 NOTE — Progress Notes (Signed)
CC'D TO PCP °

## 2018-04-24 ENCOUNTER — Ambulatory Visit (HOSPITAL_COMMUNITY): Payer: Self-pay | Admitting: Anesthesiology

## 2018-04-24 ENCOUNTER — Encounter (HOSPITAL_COMMUNITY): Payer: Self-pay | Admitting: Anesthesiology

## 2018-04-24 ENCOUNTER — Encounter (HOSPITAL_COMMUNITY)
Admission: RE | Disposition: A | Discharge status: Home / Self Care | Payer: Self-pay | Source: Ambulatory Visit | Attending: Internal Medicine

## 2018-04-24 ENCOUNTER — Ambulatory Visit (HOSPITAL_COMMUNITY)
Admission: RE | Admit: 2018-04-24 | Discharge: 2018-04-24 | Disposition: A | Discharge status: Home / Self Care | Payer: Self-pay | Source: Ambulatory Visit | Attending: Internal Medicine | Admitting: Internal Medicine

## 2018-04-24 DIAGNOSIS — K210 Gastro-esophageal reflux disease with esophagitis: Secondary | ICD-10-CM | POA: Insufficient documentation

## 2018-04-24 DIAGNOSIS — K625 Hemorrhage of anus and rectum: Secondary | ICD-10-CM

## 2018-04-24 DIAGNOSIS — R140 Abdominal distension (gaseous): Secondary | ICD-10-CM

## 2018-04-24 DIAGNOSIS — R1013 Epigastric pain: Secondary | ICD-10-CM

## 2018-04-24 DIAGNOSIS — Z86010 Personal history of colonic polyps: Secondary | ICD-10-CM | POA: Insufficient documentation

## 2018-04-24 DIAGNOSIS — F419 Anxiety disorder, unspecified: Secondary | ICD-10-CM | POA: Insufficient documentation

## 2018-04-24 DIAGNOSIS — Z8719 Personal history of other diseases of the digestive system: Secondary | ICD-10-CM

## 2018-04-24 DIAGNOSIS — R112 Nausea with vomiting, unspecified: Secondary | ICD-10-CM

## 2018-04-24 DIAGNOSIS — K219 Gastro-esophageal reflux disease without esophagitis: Secondary | ICD-10-CM

## 2018-04-24 DIAGNOSIS — F17210 Nicotine dependence, cigarettes, uncomplicated: Secondary | ICD-10-CM | POA: Insufficient documentation

## 2018-04-24 DIAGNOSIS — I10 Essential (primary) hypertension: Secondary | ICD-10-CM | POA: Insufficient documentation

## 2018-04-24 DIAGNOSIS — K449 Diaphragmatic hernia without obstruction or gangrene: Secondary | ICD-10-CM | POA: Insufficient documentation

## 2018-04-24 DIAGNOSIS — K50911 Crohn's disease, unspecified, with rectal bleeding: Secondary | ICD-10-CM | POA: Insufficient documentation

## 2018-04-24 DIAGNOSIS — K921 Melena: Secondary | ICD-10-CM

## 2018-04-24 DIAGNOSIS — Z79899 Other long term (current) drug therapy: Secondary | ICD-10-CM | POA: Insufficient documentation

## 2018-04-24 DIAGNOSIS — K209 Esophagitis, unspecified: Secondary | ICD-10-CM

## 2018-04-24 DIAGNOSIS — E039 Hypothyroidism, unspecified: Secondary | ICD-10-CM | POA: Insufficient documentation

## 2018-04-24 DIAGNOSIS — K648 Other hemorrhoids: Secondary | ICD-10-CM | POA: Insufficient documentation

## 2018-04-24 DIAGNOSIS — Z87442 Personal history of urinary calculi: Secondary | ICD-10-CM | POA: Insufficient documentation

## 2018-04-24 HISTORY — PX: BIOPSY: SHX5522

## 2018-04-24 HISTORY — PX: ESOPHAGOGASTRODUODENOSCOPY (EGD) WITH PROPOFOL: SHX5813

## 2018-04-24 HISTORY — PX: COLONOSCOPY WITH PROPOFOL: SHX5780

## 2018-04-24 LAB — GLUCOSE, CAPILLARY
Glucose-Capillary: 105 mg/dL — ABNORMAL HIGH (ref 70–99)
Glucose-Capillary: 108 mg/dL — ABNORMAL HIGH (ref 70–99)

## 2018-04-24 SURGERY — COLONOSCOPY WITH PROPOFOL
Anesthesia: Monitor Anesthesia Care

## 2018-04-24 MED ORDER — MIDAZOLAM HCL 5 MG/5ML IJ SOLN
INTRAMUSCULAR | Status: DC | PRN
  Administered 2018-04-24: 2 mg via INTRAVENOUS

## 2018-04-24 MED ORDER — KETAMINE HCL 10 MG/ML IJ SOLN
INTRAMUSCULAR | Status: DC | PRN
  Administered 2018-04-24: 10 mg via INTRAVENOUS

## 2018-04-24 MED ORDER — LACTATED RINGERS IV SOLN
INTRAVENOUS | Status: DC | PRN
  Administered 2018-04-24: 10:00:00 via INTRAVENOUS

## 2018-04-24 MED ORDER — PROPOFOL 10 MG/ML IV BOLUS
INTRAVENOUS | Status: DC | PRN
  Administered 2018-04-24: 40 mg via INTRAVENOUS

## 2018-04-24 MED ORDER — PROPOFOL 500 MG/50ML IV EMUL
INTRAVENOUS | Status: DC | PRN
  Administered 2018-04-24: 10:00:00 via INTRAVENOUS
  Administered 2018-04-24: 150 ug/kg/min via INTRAVENOUS

## 2018-04-24 MED ORDER — CHLORHEXIDINE GLUCONATE CLOTH 2 % EX PADS: 6.0000 | MEDICATED_PAD | Freq: Once | CUTANEOUS | Status: DC

## 2018-04-24 MED ORDER — LIDOCAINE HCL (CARDIAC) PF 100 MG/5ML IV SOSY
PREFILLED_SYRINGE | INTRAVENOUS | Status: DC | PRN
  Administered 2018-04-24: 40 mg via INTRAVENOUS

## 2018-04-24 MED ORDER — MIDAZOLAM HCL 2 MG/2ML IJ SOLN
INTRAMUSCULAR | Status: AC
  Filled 2018-04-24: qty 2

## 2018-04-24 MED ORDER — KETAMINE HCL 50 MG/5ML IJ SOSY
PREFILLED_SYRINGE | INTRAMUSCULAR | Status: AC
  Filled 2018-04-24: qty 5

## 2018-04-24 MED ORDER — PROPOFOL 10 MG/ML IV BOLUS
INTRAVENOUS | Status: AC
  Filled 2018-04-24: qty 60

## 2018-04-24 MED ORDER — STERILE WATER FOR IRRIGATION IR SOLN
Status: DC | PRN
  Administered 2018-04-24: 1.5 mL

## 2018-04-24 MED ORDER — GLYCOPYRROLATE 0.2 MG/ML IJ SOLN
INTRAMUSCULAR | Status: DC | PRN
  Administered 2018-04-24: 0.2 mg via INTRAVENOUS

## 2018-04-24 NOTE — Transfer of Care (Signed)
Immediate Anesthesia Transfer of Care Note  Patient: Victoria Deleon  Procedure(s) Performed: COLONOSCOPY WITH PROPOFOL (N/A ) ESOPHAGOGASTRODUODENOSCOPY (EGD) WITH PROPOFOL (N/A ) BIOPSY  Patient Location: PACU  Anesthesia Type:MAC  Level of Consciousness: awake, alert , oriented and patient cooperative  Airway & Oxygen Therapy: Patient Spontanous Breathing  Post-op Assessment: Report given to RN and Post -op Vital signs reviewed and stable  Post vital signs: Reviewed and stable  Last Vitals:  Vitals Value Taken Time  BP 129/82 04/24/2018 10:39 AM  Temp    Pulse    Resp    SpO2    Vitals shown include unvalidated device data.  Last Pain:  Vitals:   04/24/18 1005  TempSrc:   PainSc: 0-No pain      Patients Stated Pain Goal: 6 (53/79/43 [id number])  Complications: No apparent anesthesia complications

## 2018-04-24 NOTE — Discharge Instructions (Signed)
Monitored Anesthesia Care, Care After These instructions provide you with information about caring for yourself after your procedure. Your health care provider may also give you more specific instructions. Your treatment has been planned according to current medical practices, but problems sometimes occur. Call your health care provider if you have any problems or questions after your procedure. What can I expect after the procedure? After your procedure, it is common to:  Feel sleepy for several hours.  Feel clumsy and have poor balance for several hours.  Feel forgetful about what happened after the procedure.  Have poor judgment for several hours.  Feel nauseous or vomit.  Have a sore throat if you had a breathing tube during the procedure.  Follow these instructions at home: For at least 24 hours after the procedure:   Do not: ? Participate in activities in which you could fall or become injured. ? Drive. ? Use heavy machinery. ? Drink alcohol. ? Take sleeping pills or medicines that cause drowsiness. ? Make important decisions or sign legal documents. ? Take care of children on your own.  Rest. Eating and drinking  Follow the diet that is recommended by your health care provider.  If you vomit, drink water, juice, or soup when you can drink without vomiting.  Make sure you have little or no nausea before eating solid foods. General instructions  Have a responsible adult stay with you until you are awake and alert.  Take over-the-counter and prescription medicines only as told by your health care provider.  If you smoke, do not smoke without supervision.  Keep all follow-up visits as told by your health care provider. This is important. Contact a health care provider if:  You keep feeling nauseous or you keep vomiting.  You feel light-headed.  You develop a rash.  You have a fever. Get help right away if:  You have trouble breathing. This information is  not intended to replace advice given to you by your health care provider. Make sure you discuss any questions you have with your health care provider. Document Released: 09/07/2015 Document Revised: 01/07/2016 Document Reviewed: 09/07/2015 Elsevier Interactive Patient Education  2018 [employer].  Colonoscopy Discharge Instructions  Read the instructions outlined below and refer to this sheet in the next few weeks. These discharge instructions provide you with general information on caring for yourself after you leave the hospital. Your doctor may also give you specific instructions. While your treatment has been planned according to the most current medical practices available, unavoidable complications occasionally occur. If you have any problems or questions after discharge, call Dr. Gala Romney at [phone]. ACTIVITY  You may resume your regular activity, but move at a slower pace for the next 24 hours.   Take frequent rest periods for the next 24 hours.   Walking will help get rid of the air and reduce the bloated feeling in your belly (abdomen).   No driving for 24 hours (because of the medicine (anesthesia) used during the test).    Do not sign any important legal documents or operate any machinery for 24 hours (because of the anesthesia used during the test).  NUTRITION  Drink plenty of fluids.   You may resume your normal diet as instructed by your doctor.   Begin with a light meal and progress to your normal diet. Heavy or fried foods are harder to digest and may make you feel sick to your stomach (nauseated).   Avoid alcoholic beverages for 24 hours or as  instructed.  MEDICATIONS  You may resume your normal medications unless your doctor tells you otherwise.  WHAT YOU CAN EXPECT TODAY  Some feelings of bloating in the abdomen.   Passage of more gas than usual.   Spotting of blood in your stool or on the toilet paper.  IF YOU HAD POLYPS REMOVED DURING THE COLONOSCOPY:  No  aspirin products for 7 days or as instructed.   No alcohol for 7 days or as instructed.   Eat a soft diet for the next 24 hours.  FINDING OUT THE RESULTS OF YOUR TEST Not all test results are available during your visit. If your test results are not back during the visit, make an appointment with your caregiver to find out the results. Do not assume everything is normal if you have not heard from your caregiver or the medical facility. It is important for you to follow up on all of your test results.  SEEK IMMEDIATE MEDICAL ATTENTION IF:  You have more than a spotting of blood in your stool.   Your belly is swollen (abdominal distention).   You are nauseated or vomiting.   You have a temperature over 101.   You have abdominal pain or discomfort that is severe or gets worse throughout the day.  EGD Discharge instructions Please read the instructions outlined below and refer to this sheet in the next few weeks. These discharge instructions provide you with general information on caring for yourself after you leave the hospital. Your doctor may also give you specific instructions. While your treatment has been planned according to the most current medical practices available, unavoidable complications occasionally occur. If you have any problems or questions after discharge, please call your doctor. ACTIVITY  You may resume your regular activity but move at a slower pace for the next 24 hours.   Take frequent rest periods for the next 24 hours.   Walking will help expel (get rid of) the air and reduce the bloated feeling in your abdomen.   No driving for 24 hours (because of the anesthesia (medicine) used during the test).   You may shower.   Do not sign any important legal documents or operate any machinery for 24 hours (because of the anesthesia used during the test).  NUTRITION  Drink plenty of fluids.   You may resume your normal diet.   Begin with a light meal and progress  to your normal diet.   Avoid alcoholic beverages for 24 hours or as instructed by your caregiver.  MEDICATIONS  You may resume your normal medications unless your caregiver tells you otherwise.  WHAT YOU CAN EXPECT TODAY  You may experience abdominal discomfort such as a feeling of fullness or gas pains.  FOLLOW-UP  Your doctor will discuss the results of your test with you.  SEEK IMMEDIATE MEDICAL ATTENTION IF ANY OF THE FOLLOWING OCCUR:  Excessive nausea (feeling sick to your stomach) and/or vomiting.   Severe abdominal pain and distention (swelling).   Trouble swallowing.   Temperature over 101 F (37.8 C).   Rectal bleeding or vomiting of blood.    GERD information provided  Begin Protonix 40 mg daily.  Further recommendations to follow pending review of pathology report  Follow-up appointment with [place] in 6 weeks.

## 2018-04-24 NOTE — H&P (Signed)
@LOGO @   Primary Care Physician:  Raiford Simmonds., PA-C Primary Gastroenterologist:  Dr. Gala Romney  Pre-Procedure History & Physical: HPI:  Victoria Deleon is a 52 y.o. female here for for evaluation of nausea vomiting diarrhea.  History of colonic polyps.  Denies dysphagia.  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  Past Medical History:  Diagnosis Date  . Anxiety   . Crohn disease ([place])   . History of kidney stones   . Hypertension   . Hypothyroidism   . Macular degeneration     Past Surgical History:  Procedure Laterality Date  . ABDOMINAL HYSTERECTOMY    . APPENDECTOMY    . CHOLECYSTECTOMY    . MEDIAL PARTIAL KNEE REPLACEMENT Left     Prior to Admission medications   Medication Sig Start Date End Date Taking? Authorizing Provider  atorvastatin (LIPITOR) 40 MG tablet Take 40 mg by mouth at bedtime.  01/16/18  Yes [provider]  busPIRone (BUSPAR) 10 MG tablet Take 10 mg by mouth 3 (three) times daily. 12/09/17  Yes [provider]  carboxymethylcellul-glycerin (OPTIVE) 0.5-0.9 % ophthalmic solution Place 1 drop into both eyes 3 (three) times daily.   Yes [provider]  citalopram (CELEXA) 20 MG tablet Take 20 mg by mouth daily. 12/09/17  Yes [provider]  doxepin (SINEQUAN) 10 MG capsule Take 10 mg by mouth daily. 01/28/18  Yes [provider]  FIBER PO Take 1 capsule by mouth 2 (two) times daily.    Yes [provider]  levothyroxine (SYNTHROID, LEVOTHROID) 150 MCG tablet Take 150 mcg by mouth daily before breakfast.  01/13/18  Yes [provider]  Multiple Vitamins-Minerals (OCUVITE-LUTEIN PO) Take 1 tablet by mouth 2 (two) times daily.    Yes [provider]  naproxen sodium (ALEVE) 220 MG tablet Take 440 mg by mouth daily as needed (for pain or headache).   Yes [provider]  polyethylene glycol (MIRALAX / GLYCOLAX) packet Take 17 g by mouth daily.   Yes [provider]    Allergies as of 03/14/2018 - Review Complete 02/13/2018  Allergen Reaction Noted  . Codeine Anaphylaxis 06/02/2017    Family History  Adopted: Yes    Social History   Socioeconomic History  . Marital status: Married    Spouse name: Not on file  . Number of children: Not on file  . Years of education: Not on file  . Highest education level: Not on file  Occupational History  . Not on file  Social Needs  . Financial resource strain: Not on file  . Food insecurity:    Worry: Not on file    Inability: Not on file  . Transportation needs:    Medical: Not on file    Non-medical: Not on file  Tobacco Use  . Smoking status: Current Every Day Smoker    Packs/day: 0.25    Years: 30.00    Pack years: 7.50    Types: Cigarettes  . Smokeless tobacco: Never Used  Substance and Sexual Activity  . Alcohol use: Never    Frequency: Never  . Drug use: Never  . Sexual activity: Yes    Birth control/protection: Surgical  Lifestyle  . Physical activity:    Days per week: Not on file    Minutes per session: Not on file  . Stress: Not on file  Relationships  . Social connections:    Talks on phone: Not on file    Gets  together: Not on file    Attends religious service: Not on file    Active member of club or organization: Not on file    Attends meetings of clubs or organizations: Not on file    Relationship status: Not on file  . Intimate partner violence:    Fear of current or ex partner: Not on file    Emotionally abused: Not on file    Physically abused: Not on file    Forced sexual activity: Not on file  Other Topics Concern  . Not on file  Social History Narrative  . Not on file    Review of Systems: See HPI, otherwise negative ROS  Physical Exam: BP 110/65   Pulse 63   Temp 97.7 F (36.5 C) (Oral)   Resp 18   SpO2 98%  General:   Alert,  Well-developed, well-nourished, pleasant and cooperative in NAD Skin:  Intact without significant lesions or  rashes. Neck:  Supple; no masses or thyromegaly. No significant cervical adenopathy. Lungs:  Clear throughout to auscultation.   No wheezes, crackles, or rhonchi. No acute distress. Heart:  Regular rate and rhythm; no murmurs, clicks, rubs,  or gallops. Abdomen: Non-distended, normal bowel sounds.  Soft and nontender without appreciable mass or hepatosplenomegaly.  Pulses:  Normal pulses noted. Extremities:  Without clubbing or edema.  Impression/Plan: Nausea vomiting, bloating rectal bleeding history of colonic polyps and Crohn's disease..  Patient here for EGD and colonoscopy.  Low total IgA level negative TTG level.  Small bowel biopsy planned.  The risks, benefits, limitations, imponderables and alternatives regarding both EGD and colonoscopy have been reviewed with the patient. Questions have been answered. All parties agreeable.     Notice: This dictation was prepared with Dragon dictation along with smaller phrase technology. Any transcriptional errors that result from this process are unintentional and may not be corrected upon review.

## 2018-04-24 NOTE — Op Note (Signed)
[facility] Patient Name: Victoria Deleon Procedure Date: 04/24/2018 9:48 AM MRN: [id number] Date of Birth: [birth year] Attending MD: Norvel Richards , MD CSN: [id number] Age: 52 Admit Type: Outpatient Procedure:                Upper GI endoscopy Indications:              Epigastric abdominal pain, Nausea with vomiting Providers:                Norvel Richards, MD, Gerome Sam, RN,                            Aram Candela Referring MD:             Royce Macadamia PA, [place] Medicines:                Propofol per Anesthesia Complications:            No immediate complications. Estimated Blood Loss:     Estimated blood loss was minimal. Procedure:                Pre-Anesthesia Assessment:                           - Prior to the procedure, a History and Physical                            was performed, and patient medications and                            allergies were reviewed. The patient's tolerance of                            previous anesthesia was also reviewed. The risks                            and benefits of the procedure and the sedation                            options and risks were discussed with the patient.                            All questions were answered, and informed consent                            was obtained. Prior Anticoagulants: The patient has                            taken no previous anticoagulant or antiplatelet                            agents. ASA Grade Assessment: II - A patient with                            mild systemic disease. After reviewing the risks  and benefits, the patient was deemed in                            satisfactory condition to undergo the procedure.                           After obtaining informed consent, the endoscope was                            passed under direct vision. Throughout the                            procedure, the patient's blood pressure, pulse, and                        oxygen saturations were monitored continuously. The                            [id number]-[id number] ([id number]) was introduced through the and                            advanced to the third part of duodenum. The upper                            GI endoscopy was accomplished without difficulty.                            The patient tolerated the procedure well. Scope In: 10:07:53 AM Scope Out: 10:12:16 AM Total Procedure Duration: 0 hours 4 minutes 23 seconds  Findings:      Four-quadrant erosions with superimposed ulceration at the GE junction.       Esophagitis superimposed on noncritical Schatzki's ring. No Barrett's       epithelium seen. Tubular esophagus was patent throughout its course.      A small hiatal hernia was present. Comic normal otherwise.      The duodenal bulb, second portion of the duodenum and third portion of       the duodenum were normal. This was biopsied with a cold forceps for       histology. Estimated blood loss was minimal. Impression:               -Rather severe ulcerative reflux esophagitis                           - Small hiatal hernia.                           - Normal duodenal bulb, second portion of the                            duodenum and third portion of the duodenum.                            Biopsied. Moderate Sedation:      Moderate (conscious) sedation was personally administered by an       anesthesia professional. The following parameters  were monitored: oxygen       saturation, heart rate, blood pressure, respiratory rate, EKG, adequacy       of pulmonary ventilation, and response to care. Recommendation:           - Patient has a contact number available for                            emergencies. The signs and symptoms of potential                            delayed complications were discussed with the                            patient. Return to normal activities tomorrow.                            Written discharge  instructions were provided to the                            patient.                           - Resume previous diet.                           - Continue present medications. Begin Protonix 40                            mg daily.                           - No repeat upper endoscopy.                           - Return to GI office in 2 months. See colonoscopy                            report. Procedure Code(s):        --- Professional ---                           [phone], Esophagogastroduodenoscopy, flexible,                            transoral; with biopsy, single or multiple Diagnosis Code(s):        --- Professional ---                           K20.9, Esophagitis, unspecified                           K44.9, Diaphragmatic hernia without obstruction or                            gangrene  R10.13, Epigastric pain                           R11.2, Nausea with vomiting, unspecified CPT copyright 2018 American Medical Association. All rights reserved. The codes documented in this report are preliminary and upon coder review may  be revised to meet current compliance requirements. Cristopher Estimable. Laylah Riga, MD Norvel Richards, MD 04/24/2018 10:38:10 AM This report has been signed electronically. Number of Addenda: 0

## 2018-04-24 NOTE — Op Note (Signed)
[facility] Patient Name: Victoria Deleon Procedure Date: 04/24/2018 10:14 AM MRN: [id number] Date of Birth: April 13, 1966 Attending MD: Norvel Richards , MD CSN: [id number] Age: 52 Admit Type: Outpatient Procedure:                Colonoscopy Indications:              Hematochezia Providers:                Norvel Richards, MD, Gerome Sam, RN,                            Aram Candela Referring MD:              Medicines:                Propofol per Anesthesia Complications:            No immediate complications. Estimated Blood Loss:     Estimated blood loss was minimal. Procedure:                Pre-Anesthesia Assessment:                           - Prior to the procedure, a History and Physical                            was performed, and patient medications and                            allergies were reviewed. The patient's tolerance of                            previous anesthesia was also reviewed. The risks                            and benefits of the procedure and the sedation                            options and risks were discussed with the patient.                            All questions were answered, and informed consent                            was obtained. Prior Anticoagulants: The patient has                            taken no previous anticoagulant or antiplatelet                            agents. ASA Grade Assessment: II - A patient with                            mild systemic disease. After reviewing the risks                            and  benefits, the patient was deemed in                            satisfactory condition to undergo the procedure.                           After obtaining informed consent, the colonoscope                            was passed under direct vision. Throughout the                            procedure, the patient's blood pressure, pulse, and                            oxygen saturations were monitored  continuously. The                            [id number]-[id number] ([id number]) scope was introduced through                            the and advanced to the 15 cm into the ileum. The                            colonoscopy was performed without difficulty. The                            patient tolerated the procedure well. The quality                            of the bowel preparation was adequate. Scope In: 10:17:30 AM Scope Out: 10:32:14 AM Scope Withdrawal Time: 0 hours 10 minutes 4 seconds  Total Procedure Duration: 0 hours 14 minutes 44 seconds  Findings:      The perianal and digital rectal examinations were normal.      The colon (entire examined portion) appeared normal aside from internal       hemorrhoids seen on?"face in the anal canal. Rectal vault too small to       retroflex. Rectal mucosa seen well on?"face.. Terminal ileum intubated to       15 cm. It appeared normal. Biopsies taken. Sigmoid biopsies of the right       and left colon also taken. Impression:               - The entire examined colon is normal.                           -Internal hemorrhoids. Normal terminal ileum.                            Status post ileal and colon biopsy. See EGD report.                           - Moderate Sedation:      Moderate (conscious) sedation was personally administered by an  anesthesia professional. The following parameters were monitored: oxygen       saturation, heart rate, blood pressure, respiratory rate, EKG, adequacy       of pulmonary ventilation, and response to care. Recommendation:           - Patient has a contact number available for                            emergencies. The signs and symptoms of potential                            delayed complications were discussed with the                            patient. Return to normal activities tomorrow.                            Written discharge instructions were provided to the                             patient. Procedure Code(s):        --- Professional ---                           [phone], Colonoscopy, flexible; diagnostic, including                            collection of specimen(s) by brushing or washing,                            when performed (separate procedure) Diagnosis Code(s):        --- Professional ---                           K92.1, Melena (includes Hematochezia) CPT copyright 2018 American Medical Association. All rights reserved. The codes documented in this report are preliminary and upon coder review may  be revised to meet current compliance requirements. Cristopher Estimable. [name], MD Norvel Richards, MD 04/24/2018 10:42:08 AM This report has been signed electronically. Number of Addenda: 0

## 2018-04-24 NOTE — Anesthesia Postprocedure Evaluation (Signed)
Anesthesia Post Note  Patient: Victoria Deleon  Procedure(s) Performed: COLONOSCOPY WITH PROPOFOL (N/A ) ESOPHAGOGASTRODUODENOSCOPY (EGD) WITH PROPOFOL (N/A ) BIOPSY  Patient location during evaluation: PACU Anesthesia Type: MAC Level of consciousness: awake and alert and oriented Pain management: pain level controlled Vital Signs Assessment: post-procedure vital signs reviewed and stable Respiratory status: spontaneous breathing Cardiovascular status: stable Postop Assessment: no apparent nausea or vomiting Anesthetic complications: no     Last Vitals:  Vitals:   04/24/18 0935  BP: 110/65  Pulse: 63  Resp: 18  Temp: 36.5 C  SpO2: 98%    Last Pain:  Vitals:   04/24/18 1005  TempSrc:   PainSc: 0-No pain                 Madelene Kaatz A

## 2018-04-24 NOTE — Anesthesia Procedure Notes (Signed)
Procedure Name: MAC Performed by: Tyreonna Czaplicki A, CRNA Pre-anesthesia Checklist: Patient identified, Emergency Drugs available, Suction available, Timeout performed and Patient being monitored Patient Re-evaluated:Patient Re-evaluated prior to induction Oxygen Delivery Method: Non-rebreather mask       

## 2018-04-24 NOTE — Anesthesia Preprocedure Evaluation (Signed)
Anesthesia Evaluation  Patient identified by MRN, date of birth, ID band Patient awake    Reviewed: Allergy & Precautions, H&P , NPO status , Patient's Chart, lab work & pertinent test results  Airway Mallampati: II  TM Distance: >3 FB Neck ROM: full    Dental no notable dental hx.  TMJ .Marland Kitchen No problems with previous endoscopies:   Pulmonary neg pulmonary ROS, Current Smoker,    Pulmonary exam normal breath sounds clear to auscultation       Cardiovascular Exercise Tolerance: Good hypertension, negative cardio ROS   Rhythm:regular Rate:Normal     Neuro/Psych Anxiety Severe anxietynegative neurological ROS  negative psych ROS   GI/Hepatic negative GI ROS, Neg liver ROS,   Endo/Other  Hypothyroidism   Renal/GU negative Renal ROS  negative genitourinary   Musculoskeletal   Abdominal   Peds  Hematology negative hematology ROS (+)   Anesthesia Other Findings   Reproductive/Obstetrics negative OB ROS                             Anesthesia Physical Anesthesia Plan  ASA: II  Anesthesia Plan: MAC   Post-op Pain Management:    Induction:   PONV Risk Score and Plan:   Airway Management Planned:   Additional Equipment:   Intra-op Plan:   Post-operative Plan:   Informed Consent: I have reviewed the patients History and Physical, chart, labs and discussed the procedure including the risks, benefits and alternatives for the proposed anesthesia with the patient or authorized representative who has indicated his/her understanding and acceptance.   Dental Advisory Given  Plan Discussed with: CRNA  Anesthesia Plan Comments:         Anesthesia Quick Evaluation

## 2018-04-25 ENCOUNTER — Telehealth: Payer: Self-pay

## 2018-04-25 ENCOUNTER — Encounter: Payer: Self-pay | Admitting: Internal Medicine

## 2018-04-25 NOTE — Telephone Encounter (Signed)
Per RMR- Send letter to patient.  Send copy of letter with path to referring provider and PCP.   Needs an office visit within 3 months from now if not already scheduled.

## 2018-04-26 ENCOUNTER — Encounter: Payer: Self-pay | Admitting: Internal Medicine

## 2018-04-26 NOTE — Telephone Encounter (Signed)
OV made and letter mailed °

## 2018-05-01 ENCOUNTER — Encounter (HOSPITAL_COMMUNITY): Payer: Self-pay | Admitting: Internal Medicine

## 2018-05-04 ENCOUNTER — Telehealth: Payer: Self-pay

## 2018-05-04 NOTE — Telephone Encounter (Signed)
Pt came by the office and said the Protonix that Dr. Gala Romney wrote for was going to cost $70.00 and she could not afford it. She said the Health Dept is working on getting it for her, but told her in the meantime to come by and see if we had samples of anything since she is having a lot of reflux.  I spoke with Neil Crouch, PA, who saw pt in the office and she said to give her Dexilant 60 mg #15 to take one capsule daily 30 min before breakfast.   Samples and instructions given to pt and she will make sure Health Dept follows up on getting the Protonix for her.

## 2018-05-04 NOTE — Telephone Encounter (Signed)
agree

## 2018-05-09 ENCOUNTER — Ambulatory Visit: Payer: Self-pay | Admitting: Nurse Practitioner

## 2018-05-29 ENCOUNTER — Telehealth: Payer: Self-pay | Admitting: Internal Medicine

## 2018-05-29 NOTE — Telephone Encounter (Signed)
Spoke with pt. She is still checking into the Pantoprazole per her last ov from the health department. [place] Per LSL to give pt Dexilant samples when she called last. Samples are ready for pickup.

## 2018-05-29 NOTE — Telephone Encounter (Signed)
[phone]  PATIENT CALLED WANTING MORE SAMPLES.  SHE DID NOT KNOW THE NAME OF THE MEDICATION BUT IT WAS GIVEN TO HER AT HER LAST OFFICE VISIT.

## 2018-07-27 ENCOUNTER — Ambulatory Visit (INDEPENDENT_AMBULATORY_CARE_PROVIDER_SITE_OTHER): Payer: Self-pay | Admitting: Nurse Practitioner

## 2018-07-27 ENCOUNTER — Encounter: Payer: Self-pay | Admitting: Nurse Practitioner

## 2018-07-27 ENCOUNTER — Other Ambulatory Visit: Payer: Self-pay | Admitting: *Deleted

## 2018-07-27 VITALS — BP 124/82 | HR 73 | Temp 97.6°F | Ht 61.0 in | Wt 179.6 lb

## 2018-07-27 DIAGNOSIS — K5900 Constipation, unspecified: Secondary | ICD-10-CM | POA: Insufficient documentation

## 2018-07-27 DIAGNOSIS — K219 Gastro-esophageal reflux disease without esophagitis: Secondary | ICD-10-CM | POA: Insufficient documentation

## 2018-07-27 DIAGNOSIS — R9431 Abnormal electrocardiogram [ECG] [EKG]: Secondary | ICD-10-CM

## 2018-07-27 DIAGNOSIS — K5090 Crohn's disease, unspecified, without complications: Secondary | ICD-10-CM

## 2018-07-27 HISTORY — DX: Abnormal electrocardiogram (ECG) (EKG): R94.31

## 2018-07-27 NOTE — Patient Instructions (Signed)
Your health issues we discussed today were:   Constipation: 1. When we receive samples of Linzess 72 mcg we will call you.  Take this once a day on an empty stomach 2. Call us 1 to 2 weeks after starting Linzess and let us know if it is helping 3. In the meantime, take MiraLAX twice a day (in the morning and in the evening) to help your constipation  GERD (heartburn): 1. We will try to start patient assistance paperwork to get Dexilant covered for you 2. Further information related to GERD and foods that tend to cause worsening symptoms as below 3. While we are working on [employer] paperwork, increase Protonix to twice a day 4. Notify us of any severe or worsening symptoms  Abnormal EKG: 1. We will refer you to cardiology to further evaluate your EKG and decide if any further work-up is needed  Overall I recommend:  1. Return for follow-up in 3 months 2. Call us if you have any questions or concerns.  At [facility] we value your feedback. You may receive a survey about your visit today. Please share your experience as we strive to create trusting relationships with our patients to provide genuine, compassionate, quality care.  We appreciate your understanding and patience as we review any laboratory studies, imaging, and other diagnostic tests that are ordered as we care for you. Our office policy is 5 business days for review of these results, and any emergent or urgent results are addressed in a timely manner for your best interest. If you do not hear from our office in 1 week, please contact us.   We also encourage the use of MyChart, which contains your medical information for your review as well. If you are not enrolled in this feature, an access code is on this after visit summary for your convenience. Thank you for allowing [place] to be involved in your care.  It was great to see you today!  I hope you have a great day!!

## 2018-07-27 NOTE — Progress Notes (Signed)
Referring Provider: Raiford Simmonds., PA-C Primary Care Physician:  Raiford Simmonds., PA-C Primary GI:  Dr. Gala Romney  Chief Complaint  Patient presents with  . Gastroesophageal Reflux  . Constipation    HPI:   Victoria Deleon is a 53 y.o. female who presents for post procedure follow-up on Crohn's disease.  The patient was last seen in our office 02/13/2018 for Crohn's disease without complication.  At that time the patient was new to the area.  Diagnosed with small bowel Crohn's disease 13 years prior in [place].  States a previous colonoscopy was completed and her small bowel looked like "hamburger meat."  The patient was supposed to start a medication but was scared after a nurse advised against it and recommended to her instead take aloe vera juice.  She changed her diet and began aloe vera juice with some improvement in small bowel mucosa.  She continue this for years but over the previous few years has had difficult to manage disease.    At her last visit she noted intermittent symptoms over the past several years and with lack of medical insurance coverage she was limited in her care.  Alternating constipation and diarrhea though mostly constipation.  At one point the previous week she required a enema.  Frequent severe reflux, postprandial abdominal bloating, occasional bright red blood per rectum.  No weight loss.  She is vegan and gluten intolerant.  Admits significant anxiety.  Requested previous records from [place] including last colonoscopy.  Recommended CBC, c-Met, TTG IgA, serum IgA, CT of the abdomen and pelvis with contrast for abdominal pain, vomiting, diarrhea in the setting of Crohn's disease.  Labs showed celiac screen negative, no anemia, LFTs and kidney function stable.  Unable to obtain records from [facility], only a pathology report with hyperplastic polyp.  CT showed tiny nonobstructive stone in the right kidney, moderate amount of stool, no evidence of active  colitis.  Recommended colonoscopy and EGD with propofol.  Also recommended Linzess 145 mcg for constipation.  EGD completed 04/24/2018 which found rather severe ulcerative reflux esophagitis, small hiatal hernia, normal duodenum status post biopsy.  Surgical pathology found the biopsies to be duodenal mucosa without significant histopathological changes.  Recommended begin Protonix 40 mg daily.    Colonoscopy completed the same day found entire examined colon normal, internal hemorrhoids, normal terminal ileum, status post ileal colon biopsy.  Surgical pathology found the biopsies to be ileal mucosa without significant histopathological changes and colon mucosa without significant histopathological changes.  Recommended repeat colonoscopy in 10 years.  Today she states she's doing well overall. Having constipation with a bowel movement 1-2 times a week and subsequent bloating, lower abdominal discomfort. Pain improves with a bowel movement. Eats a lot of fiber, drinks strictly water other than a cup of coffee a day and herbal tea in the evenings. GERD was doing well on Dexilant but was changed to Protonix daily and having worsening symptoms. Recent EKG which she was told it was "a little abnormal." Has intermittent nausea with her GERD symptoms. Denies hematochezia. Will have dark stools (if she hasn't had a bowel movement in a while). Denies fever, chills, unintentional weight loss. Denies chest pain, dyspnea, dizziness, lightheadedness, syncope, near syncope. Denies any other upper or lower GI symptoms.  Past Medical History:  Diagnosis Date  . Anxiety   . Crohn disease ([place])   . History of kidney stones   . Hypertension   . Hypothyroidism   . Macular degeneration  Past Surgical History:  Procedure Laterality Date  . ABDOMINAL HYSTERECTOMY    . APPENDECTOMY    . BIOPSY  04/24/2018   Procedure: BIOPSY;  Surgeon: Daneil Dolin, MD;  [facility];  Service: Endoscopy;;   (gastric/duodenal)  . CHOLECYSTECTOMY    . COLONOSCOPY WITH PROPOFOL N/A 04/24/2018   Procedure: COLONOSCOPY WITH PROPOFOL;  Surgeon: Daneil Dolin, MD;  [facility];  Service: Endoscopy;  Laterality: N/A;  10:30am  . ESOPHAGOGASTRODUODENOSCOPY (EGD) WITH PROPOFOL N/A 04/24/2018   Procedure: ESOPHAGOGASTRODUODENOSCOPY (EGD) WITH PROPOFOL;  Surgeon: Daneil Dolin, MD;  [facility];  Service: Endoscopy;  Laterality: N/A;  . MEDIAL PARTIAL KNEE REPLACEMENT Left     Current Outpatient Medications  Medication Sig Dispense Refill  . atorvastatin (LIPITOR) 40 MG tablet Take 40 mg by mouth at bedtime.   6  . busPIRone (BUSPAR) 10 MG tablet Take 10 mg by mouth 3 (three) times daily.  3  . carboxymethylcellul-glycerin (OPTIVE) 0.5-0.9 % ophthalmic solution Place 1 drop into both eyes 3 (three) times daily.    . citalopram (CELEXA) 20 MG tablet Take 20 mg by mouth daily.  3  . doxepin (SINEQUAN) 10 MG capsule Take 10 mg by mouth daily.  3  . FIBER PO Take 1 capsule by mouth 2 (two) times daily.     Marland Kitchen levothyroxine (SYNTHROID, LEVOTHROID) 150 MCG tablet Take 150 mcg by mouth daily before breakfast.   2  . Multiple Vitamins-Minerals (OCUVITE-LUTEIN PO) Take 1 tablet by mouth 2 (two) times daily.     . naproxen sodium (ALEVE) 220 MG tablet Take 440 mg by mouth daily as needed (for pain or headache).    . pantoprazole (PROTONIX) 40 MG tablet Take 1 tablet by mouth daily.    . polyethylene glycol (MIRALAX / GLYCOLAX) packet Take 17 g by mouth daily.     No current facility-administered medications for this visit.     Allergies as of 07/27/2018 - Review Complete 07/27/2018  Allergen Reaction Noted  . Codeine Anaphylaxis 06/02/2017    Family History  Adopted: Yes    Social History   Socioeconomic History  . Marital status: Married    Spouse name: Not on file  . Number of children: Not on file  . Years of education: Not on file  . Highest education level: Not on  file  Occupational History  . Not on file  Social Needs  . Financial resource strain: Not on file  . Food insecurity:    Worry: Not on file    Inability: Not on file  . Transportation needs:    Medical: Not on file    Non-medical: Not on file  Tobacco Use  . Smoking status: Current Every Day Smoker    Packs/day: 0.25    Years: 30.00    Pack years: 7.50    Types: Cigarettes  . Smokeless tobacco: Never Used  Substance and Sexual Activity  . Alcohol use: Never    Frequency: Never  . Drug use: Never  . Sexual activity: Yes    Birth control/protection: Surgical  Lifestyle  . Physical activity:    Days per week: Not on file    Minutes per session: Not on file  . Stress: Not on file  Relationships  . Social connections:    Talks on phone: Not on file    Gets together: Not on file    Attends religious service: Not on file    Active member of  club or organization: Not on file    Attends meetings of clubs or organizations: Not on file    Relationship status: Not on file  Other Topics Concern  . Not on file  Social History Narrative  . Not on file    Review of Systems: General: Negative for anorexia, weight loss, fever, chills, fatigue, weakness. ENT: Negative for hoarseness, difficulty swallowing. CV: Negative for chest pain, angina, palpitations, peripheral edema.  Respiratory: Negative for dyspnea at rest, cough, sputum, wheezing.  GI: See history of present illness. Endo: Negative for unusual weight change.  Heme: Negative for bruising or bleeding. Allergy: Negative for rash or hives.   Physical Exam: BP 124/82   Pulse 73   Temp 97.6 F (36.4 C) (Oral)   Ht 5' 1"  (1.549 m)   Wt 179 lb 9.6 oz (81.5 kg)   BMI 33.94 kg/m  General:   Alert and oriented. Pleasant and cooperative. Well-nourished and well-developed.  Head:  Normocephalic and atraumatic. Eyes:  Without icterus, sclera clear and conjunctiva pink.  Ears:  Normal auditory acuity. Cardiovascular:  S1,  S2 present without murmurs appreciated. Extremities without clubbing or edema. Respiratory:  Clear to auscultation bilaterally. No wheezes, rales, or rhonchi. No distress.  Gastrointestinal:  +BS, soft, and non-distended. Mild lower abdominal TTP. No HSM noted. No guarding or rebound. No masses appreciated.  Rectal:  Deferred  Musculoskalatal:  Symmetrical without gross deformities. Skin:  Intact without significant lesions or rashes. Neurologic:  Alert and oriented x4;  grossly normal neurologically. Psych:  Alert and cooperative. Normal mood and affect. Heme/Lymph/Immune: No excessive bruising noted.    07/27/2018 11:43 AM   Disclaimer: This note was dictated with voice recognition software. Similar sounding words can inadvertently be transcribed and may not be corrected upon review.

## 2018-07-27 NOTE — Assessment & Plan Note (Signed)
The patient describes significant constipation.  She is taking MiraLAX once a day currently.  She only has a bowel movement about 1-2 times a week.  At this point I will have her increase MiraLAX to twice a day.  We will provide samples of Linzess 72 mcg daily when we receive samples.  Request a progress report 1 to 2 weeks after starting samples.  Follow-up in 3 months

## 2018-07-27 NOTE — Progress Notes (Signed)
cc'ed to pcp °

## 2018-07-27 NOTE — Assessment & Plan Note (Signed)
The patient states she was told she had an abnormal EKG prior to her procedure and the procedure was subsequently almost canceled, but they did decide to proceed.  I reviewed the EKG which has not unspecified abnormality, questionable septal infarct of indeterminate age.  She states her primary care to health department has not had time to look over it.  At this point we will refer her to cardiology for them to evaluate and decide if any further work-up is needed.

## 2018-07-27 NOTE — Assessment & Plan Note (Signed)
Previously the patient described being diagnosed with Crohn's disease.  We were unable to obtain colonoscopy surgical report from [facility].  We repeated her colonoscopy including cecum and ileal biopsies.  No evidence of histopathological changes consistent with Crohn's disease, no visual evidence of active Crohn's disease on colonoscopy, no evidence of active Crohn's disease on CT exam.  At this point I feel it is safe to say she does not have active Crohn's disease.  We can continue to monitor for any symptoms concerning for possible Crohn's disease.  Follow-up in 3 months.

## 2018-07-27 NOTE — Assessment & Plan Note (Signed)
Chronic GERD symptoms were managed well on Dexilant samples that she was previously given.  However, the medication was too expensive and the health department put her on Protonix once a day.  I will have her increase Protonix to twice a day while we start patient assistance paperwork for Dexilant.  I will also provide printed GERD information for her.  Return for follow-up in 3 months.

## 2018-08-03 NOTE — Progress Notes (Signed)
Cardiology Office Note  Date: 08/04/2018   ID: Alsace Dowd, DOB [birth year], MRN [id number]  PCP: Raiford Simmonds., PA-C  Primary Cardiologist: Rozann Lesches, MD   Chief Complaint  Patient presents with  . Abnormal ECG    History of Present Illness: Victoria Deleon is a 53 y.o. female referred for cardiology consultation by Mr. Gordy Levan NP for evaluation of abnormal ECG.  Tracing in question was from April 18, 2018 prior to colonoscopy.  I personally reviewed the tracing which showed sinus rhythm with low voltage and decreased anteroseptal R waves, overall nonspecific.  No old tracing available for comparison.  She is here for a visit today.  She does not report any exertional chest pain or known history of ischemic heart disease, no prior history of myocardial infarction.  She states that she does have fatigue and some shortness of breath but this is been more chronic, she feels like it is related to her current medications.  She has had an interesting lifestyle.  She and her husband lived off the grid in [place] for 15 years, recently in this area to help her son complete a home on 10 acres, also off the grid.  She and her husband otherwise travel with an RV at this point.  Past Medical History:  Diagnosis Date  . Anxiety   . Crohn disease ([place])   . History of kidney stones   . Hypertension   . Hypothyroidism   . Macular degeneration     Past Surgical History:  Procedure Laterality Date  . ABDOMINAL HYSTERECTOMY    . APPENDECTOMY    . BIOPSY  04/24/2018   Procedure: BIOPSY;  Surgeon: Daneil Dolin, MD;  [facility];  Service: Endoscopy;;  (gastric/duodenal)  . CHOLECYSTECTOMY    . COLONOSCOPY WITH PROPOFOL N/A 04/24/2018   Procedure: COLONOSCOPY WITH PROPOFOL;  Surgeon: Daneil Dolin, MD;  [facility];  Service: Endoscopy;  Laterality: N/A;  10:30am  . ESOPHAGOGASTRODUODENOSCOPY (EGD) WITH PROPOFOL N/A 04/24/2018   Procedure:  ESOPHAGOGASTRODUODENOSCOPY (EGD) WITH PROPOFOL;  Surgeon: Daneil Dolin, MD;  [facility];  Service: Endoscopy;  Laterality: N/A;  . MEDIAL PARTIAL KNEE REPLACEMENT Left     Current Outpatient Medications  Medication Sig Dispense Refill  . atorvastatin (LIPITOR) 40 MG tablet Take 40 mg by mouth at bedtime.   6  . busPIRone (BUSPAR) 10 MG tablet Take 10 mg by mouth 3 (three) times daily.  3  . carboxymethylcellul-glycerin (OPTIVE) 0.5-0.9 % ophthalmic solution Place 1 drop into both eyes 3 (three) times daily.    . citalopram (CELEXA) 20 MG tablet Take 20 mg by mouth daily.  3  . doxepin (SINEQUAN) 10 MG capsule Take 10 mg by mouth daily.  3  . FIBER PO Take 1 capsule by mouth 2 (two) times daily.     Marland Kitchen levothyroxine (SYNTHROID, LEVOTHROID) 150 MCG tablet Take 150 mcg by mouth daily before breakfast.   2  . Multiple Vitamins-Minerals (OCUVITE-LUTEIN PO) Take 1 tablet by mouth 2 (two) times daily.     . naproxen sodium (ALEVE) 220 MG tablet Take 440 mg by mouth daily as needed (for pain or headache).    . pantoprazole (PROTONIX) 40 MG tablet Take 1 tablet by mouth daily.    . polyethylene glycol (MIRALAX / GLYCOLAX) packet Take 17 g by mouth daily.     No current facility-administered medications for this visit.    Allergies:  Codeine   Social History:  The patient  reports that she has been smoking cigarettes. She has a 7.50 pack-year smoking history. She has never used smokeless tobacco. She reports that she does not drink alcohol or use drugs.   ROS:  Please see the history of present illness. Otherwise, complete review of systems is positive for arthritic symptoms and intermittent hand and leg tingling.  All other systems are reviewed and negative.   Physical Exam: VS:  BP 124/74 (BP Location: Left Arm)   Pulse 71   Ht 5' 7"  (1.702 m)   Wt 180 lb (81.6 kg)   SpO2 97%   BMI 28.19 kg/m , BMI Body mass index is 28.19 kg/m.  Wt Readings from Last 3 Encounters:    08/04/18 180 lb (81.6 kg)  07/27/18 179 lb 9.6 oz (81.5 kg)  04/18/18 163 lb (73.9 kg)    General: Patient appears comfortable at rest. HEENT: Conjunctiva and lids normal, oropharynx clear. Neck: Supple, no elevated JVP or carotid bruits, no thyromegaly. Lungs: Clear to auscultation, nonlabored breathing at rest. Cardiac: Regular rate and rhythm, no S3, soft systolic murmur, no pericardial rub. Abdomen: Soft, nontender, bowel sounds present. Extremities: No pitting edema, distal pulses 2+. Skin: Warm and dry. Musculoskeletal: No kyphosis. Neuropsychiatric: Alert and oriented x3, affect grossly appropriate.  ECG: There is no old tracing available for comparison.  Recent Labwork: 04/18/2018: ALT 19; AST 20; BUN 14; Creatinine, Ser 0.79; Potassium 4.0; Sodium 137   Assessment and Plan:  1.  Abnormal ECG as outlined above, nonspecific anteroseptal decrease in R wave progression.  Reports no history of ischemic heart disease or previous infarct.  She has had dyspnea on exertion although this has been fairly chronic.  Plan is to obtain an echocardiogram to assess cardiac structure and function.  If LVEF is normal and there are no focal wall motion abnormalities, no further cardiac testing will be planned at this time.  2.  History of hypertension, blood pressure is normal today.  She is on no specific antihypertensives at this time.  3.  Hyperlipidemia, on Lipitor.  4.  Crohn's disease and IBS.  She follows with gastroenterology.  Current medicines were reviewed with the patient today.   Orders Placed This Encounter  Procedures  . ECHOCARDIOGRAM COMPLETE    Disposition: Call with test results.  Signed, Satira Sark, MD, [facility] 08/04/2018 3:21 PM    [place] [facility] [address] [place], [place] [address] Phone: [phone]; Fax: [phone]

## 2018-08-04 ENCOUNTER — Encounter: Payer: Self-pay | Admitting: Cardiology

## 2018-08-04 ENCOUNTER — Ambulatory Visit (INDEPENDENT_AMBULATORY_CARE_PROVIDER_SITE_OTHER): Payer: Self-pay | Admitting: Cardiology

## 2018-08-04 VITALS — BP 124/74 | HR 71 | Ht 67.0 in | Wt 180.0 lb

## 2018-08-04 DIAGNOSIS — Z8679 Personal history of other diseases of the circulatory system: Secondary | ICD-10-CM

## 2018-08-04 DIAGNOSIS — R0602 Shortness of breath: Secondary | ICD-10-CM

## 2018-08-04 DIAGNOSIS — R9431 Abnormal electrocardiogram [ECG] [EKG]: Secondary | ICD-10-CM

## 2018-08-04 DIAGNOSIS — E782 Mixed hyperlipidemia: Secondary | ICD-10-CM

## 2018-08-04 NOTE — Patient Instructions (Signed)
Medication Instructions: Your physician recommends that you continue on your current medications as directed. Please refer to the Current Medication list given to you today.   If you need a refill on your cardiac medications before your next appointment, please call your pharmacy.   Lab work: None today If you have labs (blood work) drawn today and your tests are completely normal, you will receive your results only by: Marland Kitchen MyChart Message (if you have MyChart) OR . A paper copy in the mail If you have any lab test that is abnormal or we need to change your treatment, we will call you to review the results.  Testing/Procedures: Your physician has requested that you have an echocardiogram. Echocardiography is a painless test that uses sound waves to create images of your heart. It provides your doctor with information about the size and shape of your heart and how well your heart's chambers and valves are working. This procedure takes approximately one hour. There are no restrictions for this procedure.    Follow Up:   We will call you with results.

## 2018-08-07 ENCOUNTER — Telehealth: Payer: Self-pay

## 2018-08-07 NOTE — Telephone Encounter (Signed)
Pt walked in office to turn in her Pt assistance forms for Dexilant. Pt asked for samples that she was suppose to be provided with. Last week pt was seen and asked to take Linzess 72 mcg and Dexilant 60 mg. Pt was given two Linzess samples and is aware that when we get some Dexilant samples in, we will call her. Forms will be submitted when they are signed by pts provider.

## 2018-08-08 ENCOUNTER — Ambulatory Visit (HOSPITAL_COMMUNITY)
Admission: RE | Admit: 2018-08-08 | Discharge: 2018-08-08 | Disposition: A | Discharge status: Home / Self Care | Payer: Self-pay | Source: Ambulatory Visit | Attending: Cardiology | Admitting: Cardiology

## 2018-08-08 DIAGNOSIS — R9431 Abnormal electrocardiogram [ECG] [EKG]: Secondary | ICD-10-CM | POA: Insufficient documentation

## 2018-08-08 DIAGNOSIS — R0602 Shortness of breath: Secondary | ICD-10-CM | POA: Insufficient documentation

## 2018-08-08 NOTE — Progress Notes (Signed)
*  PRELIMINARY RESULTS* Echocardiogram 2D Echocardiogram has been performed.  Victoria Deleon 08/08/2018, 9:38 AM

## 2018-08-09 NOTE — Telephone Encounter (Signed)
Samples of Dexilant are ready for pickup. Lmom, pt is aware.

## 2018-09-05 ENCOUNTER — Telehealth: Payer: Self-pay | Admitting: *Deleted

## 2018-09-05 NOTE — Telephone Encounter (Signed)
Spoke with pt, two samples boxes of Dexilant are ready for pickup.

## 2018-09-05 NOTE — Telephone Encounter (Signed)
Pt wants Dexilant samples if we have any.

## 2018-10-09 ENCOUNTER — Telehealth: Payer: Self-pay | Admitting: Internal Medicine

## 2018-10-09 NOTE — Telephone Encounter (Signed)
[phone] PATIENT CALLED WANTING SAMPLES OF DEXILANT

## 2018-10-09 NOTE — Telephone Encounter (Signed)
Spoke with pt, she is aware that when our samples arrive.

## 2018-10-25 NOTE — Telephone Encounter (Signed)
Spoke with pt, she is aware Dexilant samples are in. Pt will pick samples up in the morning.

## 2018-11-01 ENCOUNTER — Encounter: Payer: Self-pay | Admitting: Nurse Practitioner

## 2018-11-01 ENCOUNTER — Ambulatory Visit (INDEPENDENT_AMBULATORY_CARE_PROVIDER_SITE_OTHER): Payer: Self-pay | Admitting: Nurse Practitioner

## 2018-11-01 ENCOUNTER — Other Ambulatory Visit: Payer: Self-pay

## 2018-11-01 ENCOUNTER — Ambulatory Visit: Payer: Self-pay | Admitting: Nurse Practitioner

## 2018-11-01 DIAGNOSIS — K5900 Constipation, unspecified: Secondary | ICD-10-CM

## 2018-11-01 DIAGNOSIS — K219 Gastro-esophageal reflux disease without esophagitis: Secondary | ICD-10-CM

## 2018-11-01 NOTE — Progress Notes (Signed)
Referring Provider: Raiford Simmonds., PA-C Primary Care Physician:  Raiford Simmonds., PA-C Primary GI:  Dr. Gala Romney  NOTE: Service was provided via telemedicine and was requested by the patient due to COVID-19 pandemic.  Method of visit: Doxy.Me  Patient Location: Home  Provider Location: Office  Reason for Phone Visit: Follow-up  The patient was consented to phone follow-up via telephone encounter including billing of the encounter (yes/no): Yes  Persons present on the phone encounter, with roles: None  Total time (minutes) spent on medical discussion: 23 minutes  Chief Complaint  Patient presents with  . Gastroesophageal Reflux    f/u. having bad heartburn and vomiting few times a week  . Constipation    f/u. BM 1-2 times a week, straning with BM  . Bloated    HPI:   Victoria Deleon is a 53 y.o. female who presents for virtual visit regarding: Follow-up for GERD and constipation.  The patient was last seen in the office 07/27/2018 for Crohn's disease, GERD, constipation.  Diagnosed with small bowel Crohn's disease 13 years ago in [place].  She changed her diet and began aloe vera juice with some improvement in mucosal lining.  However over the past few years has had more difficult to manage disease.  She was out of GI care due to no insurance for a while.  Intermittent constipation and diarrhea.  Previous labs requested from history and which showed normal celiac screen, no anemia, LFTs and kidney function stable.  Colonoscopy report was not received by pathology report procedures found a single hyperplastic polyp.  EGD completed 04/24/2018 which found a rather severe ulcerative reflux esophagitis, small hiatal hernia, otherwise normal.  Biopsies found to be duodenal mucosa without significant histopathological changes.  Recommended given problems 40 mg daily.  Colonoscopy completed the same day.entire examined colon normal, internal hemorrhoids, normal TI, status post ileal  colon biopsy which found mucosa without significant histopathological changes and colon without significant histopathological changes.  Recommended repeat colonoscopy in 10 years.  At her last visit she was doing okay overall, constipation with a bowel movement 1-2 times a week related bloating and lower abdominal discomfort that improves after bowel movement.  Adequate water and fiber intake.  GERD doing well on Dexilant, 10 having worsening symptoms since.  Denies hematochezia, will have dark stools with no bowel movement in a while.  No other GI symptoms.  Linzess 72 mcg with progress for 1 to 2 weeks when you receive samples.  In the meantime, start MiraLAX twice a day.  Start patient assistance paperwork to get Dexilant covered, GERD education.  Refer to cardiology due to patient mention of abnormal EKG recently.  Follow-up in 3 months.  Today she states she's doing ok overall. Never received Linzess samples. Still with significant constipation, unchanged. Has a stool 1-2 times a week. When she does go, she has diarrhea. This is causing bloating, nausea, and vomiting. Has abdominal pain as well, which improves after a bowel movement. She is vegetarian and eats a lot of veggies, drinks a lot of water. She feels her GERD is worsened by constipation. Is taking Dexilant for reflux which works best of anything she's tried. Has been taking it every other day to get samples to last until she receives disability and insurance (pending for July). When she receives insurance she will take it daily. Denies fever, chills, unintentional weight loss. Denies URI or flu-like symptoms. Denies loss of sense of taste or smell. Denies chest pain, dyspnea, dizziness,  lightheadedness, syncope, near syncope. Denies any other upper or lower GI symptoms.  Takes NSAIDs as little as possible (typically about once a week; rarely more).  Past Medical History:  Diagnosis Date  . Anxiety   . Crohn disease ([place])   . History of  kidney stones   . Hypertension   . Hypothyroidism   . Macular degeneration     Past Surgical History:  Procedure Laterality Date  . ABDOMINAL HYSTERECTOMY    . APPENDECTOMY    . BIOPSY  04/24/2018   Procedure: BIOPSY;  Surgeon: Daneil Dolin, MD;  [facility]: [facility];  Service: Endoscopy;;  (gastric/duodenal)  . CHOLECYSTECTOMY    . COLONOSCOPY WITH PROPOFOL N/A 04/24/2018   Procedure: COLONOSCOPY WITH PROPOFOL;  Surgeon: Daneil Dolin, MD;  Location: [facility];  Service: Endoscopy;  Laterality: N/A;  10:30am  . ESOPHAGOGASTRODUODENOSCOPY (EGD) WITH PROPOFOL N/A 04/24/2018   Procedure: ESOPHAGOGASTRODUODENOSCOPY (EGD) WITH PROPOFOL;  Surgeon: Daneil Dolin, MD;  [facility]: [facility];  Service: Endoscopy;  Laterality: N/A;  . MEDIAL PARTIAL KNEE REPLACEMENT Left     Current Outpatient Medications  Medication Sig Dispense Refill  . atorvastatin (LIPITOR) 40 MG tablet Take 40 mg by mouth at bedtime.   6  . busPIRone (BUSPAR) 10 MG tablet Take 10 mg by mouth 3 (three) times daily.  3  . carboxymethylcellul-glycerin (OPTIVE) 0.5-0.9 % ophthalmic solution Place 1 drop into both eyes 3 (three) times daily.    . citalopram (CELEXA) 20 MG tablet Take 20 mg by mouth daily.  3  . Dexlansoprazole (DEXILANT PO) Take by mouth daily.    Marland Kitchen doxepin (SINEQUAN) 10 MG capsule Take 10 mg by mouth daily.  3  . FIBER PO Take 1 capsule by mouth 2 (two) times daily.     Marland Kitchen levothyroxine (SYNTHROID, LEVOTHROID) 150 MCG tablet Take 150 mcg by mouth daily before breakfast.   2  . Multiple Vitamins-Minerals (OCUVITE-LUTEIN PO) Take 1 tablet by mouth 2 (two) times daily.     . naproxen sodium (ALEVE) 220 MG tablet Take 440 mg by mouth daily as needed (for pain or headache).    . polyethylene glycol (MIRALAX / GLYCOLAX) packet Take 17 g by mouth daily.     No current facility-administered medications for this visit.     Allergies as of 11/01/2018 - Review Complete 11/01/2018  Allergen Reaction  Noted  . Codeine Anaphylaxis 06/02/2017    Family History  Adopted: Yes  Problem Relation Age of Onset  . Colon cancer Neg Hx     Social History   Socioeconomic History  . Marital status: Married    Spouse name: Not on file  . Number of children: Not on file  . Years of education: Not on file  . Highest education level: Not on file  Occupational History  . Not on file  Social Needs  . Financial resource strain: Not on file  . Food insecurity:    Worry: Not on file    Inability: Not on file  . Transportation needs:    Medical: Not on file    Non-medical: Not on file  Tobacco Use  . Smoking status: Current Every Day Smoker    Packs/day: 0.25    Years: 30.00    Pack years: 7.50    Types: Cigarettes  . Smokeless tobacco: Never Used  Substance and Sexual Activity  . Alcohol use: Never    Frequency: Never  . Drug use: Never  . Sexual activity:  Yes    Birth control/protection: Surgical  Lifestyle  . Physical activity:    Days per week: Not on file    Minutes per session: Not on file  . Stress: Not on file  Relationships  . Social connections:    Talks on phone: Not on file    Gets together: Not on file    Attends religious service: Not on file    Active member of club or organization: Not on file    Attends meetings of clubs or organizations: Not on file    Relationship status: Not on file  Other Topics Concern  . Not on file  Social History Narrative  . Not on file    Review of Systems: General: Negative for anorexia, weight loss, fever, chills, fatigue, weakness. Eyes: Negative for vision changes.  ENT: Negative for hoarseness, difficulty swallowing. CV: Negative for chest pain, angina, palpitations, peripheral edema.  Respiratory: Negative for dyspnea at rest, cough, sputum, wheezing.  GI: See history of present illness. Endo: Negative for unusual weight change.  Heme: Negative for bruising or bleeding. Allergy: Negative for rash or hives.  Physical  Exam: Note: limited exam due to virtual visit General:   Alert and oriented. Pleasant and cooperative. Well-nourished and well-developed.  Head:  Normocephalic and atraumatic. Eyes:  Without icterus, sclera clear and conjunctiva pink.  Ears:  Normal auditory acuity. Skin:  Intact without facial significant lesions or rashes. Neurologic:  Alert and oriented x4;  grossly normal neurologically. Psych:  Alert and cooperative. Normal mood and affect. Heme/Lymph/Immune: No excessive bruising noted.

## 2018-11-01 NOTE — Patient Instructions (Signed)
Your health issues we discussed today were:   Constipation and bloating: 1. I am giving you samples of Linzess 72 mcg.  Take this once a day, first thing in the morning, on an empty stomach 2. Call us in 1 week and let us know if how it is helping your constipation 3. We can make further recommendations depending on how you respond to Linzess  GERD (reflux/heartburn): 1. Continue taking Dexilant as you have been 2. When you obtain insurance let us know and we will send in a prescription for Dexilant to your pharmacy for you to take every day 3. Let us know if you have any severe or worsening symptoms  Overall I recommend:  1. Continue your other medications otherwise 2. Follow-up in 4 months 3. Call us if you have any questions or concerns   Because of recent events of COVID-19 ("Coronavirus"), follow CDC recommendations:  1. Wash your hand frequently 2. Avoid touching your face 3. Stay away from people who are sick 4. If you have symptoms such as fever, cough, shortness of breath then call your healthcare provider for further guidance 5. If you are sick, STAY AT HOME unless otherwise directed by your healthcare provider. 6. Follow directions from state and national officials regarding staying safe   At [facility] we value your feedback. You may receive a survey about your visit today. Please share your experience as we strive to create trusting relationships with our patients to provide genuine, compassionate, quality care.  We appreciate your understanding and patience as we review any laboratory studies, imaging, and other diagnostic tests that are ordered as we care for you. Our office policy is 5 business days for review of these results, and any emergent or urgent results are addressed in a timely manner for your best interest. If you do not hear from our office in 1 week, please contact us.   We also encourage the use of MyChart, which contains your medical  information for your review as well. If you are not enrolled in this feature, an access code is on this after visit summary for your convenience. Thank you for allowing [place] to be involved in your care.  It was great to see you today!  I hope you have a great day!!

## 2018-11-01 NOTE — Assessment & Plan Note (Signed)
Constipation somewhat worsened.  She was unable to obtain samples from [place].  We have now arrived and I set aside samples for 1 to 1-1/2 weeks for her.  Her husband will come pick these up from our office.  Recommend she take it once a day, on an empty stomach.  Call with a progress report 1 week.  Continue other medications and follow-up in 6 months.  We can make further medication adjustments based on her response.

## 2018-11-01 NOTE — Assessment & Plan Note (Signed)
GERD somewhat improved on Dexilant.  She is only able to take it every other day because she has to rely on samples at this point till she obtains insurance which she should obtain in July.  At that time we can send in a prescription for to take it every day.  Continue current medications, call for any worsening or severe symptoms.

## 2018-11-02 NOTE — Progress Notes (Signed)
cc'ed to pcp °

## 2018-11-21 ENCOUNTER — Telehealth: Payer: Self-pay | Admitting: Internal Medicine

## 2018-11-21 DIAGNOSIS — K5900 Constipation, unspecified: Secondary | ICD-10-CM

## 2018-11-21 NOTE — Telephone Encounter (Signed)
[phone] PATIENT CALLED AND SAID THE LINZESS WORKED FAIR AND WOULD LIKE TO TRY A PRESCRIPTION. Victoria Deleon

## 2018-11-21 NOTE — Telephone Encounter (Signed)
Pt would like an Rx for Liness 72 mcg sent to her pharmacy, it's working well for her.

## 2018-11-24 MED ORDER — LINACLOTIDE 72 MCG PO CAPS: 72.0000 ug | ORAL_CAPSULE | Freq: Every day | ORAL | 5 refills | Status: DC

## 2018-11-24 NOTE — Addendum Note (Signed)
Addended by: Gordy Levan, ERIC A on: 11/24/2018 04:04 PM   Modules accepted: Orders

## 2018-11-24 NOTE — Telephone Encounter (Signed)
Sent per patient request

## 2018-11-27 NOTE — Telephone Encounter (Signed)
Pt returned call and is aware that the RX was sent to her pharmacy.

## 2018-11-27 NOTE — Telephone Encounter (Signed)
Lmom, waiting on a return call.  

## 2018-12-05 ENCOUNTER — Telehealth: Payer: Self-pay | Admitting: Internal Medicine

## 2018-12-05 NOTE — Telephone Encounter (Signed)
Spoke with pt. Dexilant 60 mg samples were left up front. Linzess 72 mcg aren't available in office. Will call pt when they are available.

## 2018-12-05 NOTE — Telephone Encounter (Signed)
Patient called wanting samples of linzess and dexilant   [phone], her insurance will not be effective until the end of the month

## 2019-01-01 ENCOUNTER — Telehealth: Payer: Self-pay | Admitting: Internal Medicine

## 2019-01-01 NOTE — Telephone Encounter (Signed)
Lmom, waiting on samples to arrive. Will contact pt as soon as they arrive.

## 2019-01-01 NOTE — Telephone Encounter (Signed)
Pt said her insurance would not cover Linzess and she was asking if we had any samples. She does not know the strength. [phone]

## 2019-01-01 NOTE — Telephone Encounter (Signed)
Lmom, samples came in. Linzess 72 mcg 2 boxes were left up front and are ready for pickup.

## 2019-01-30 ENCOUNTER — Telehealth: Payer: Self-pay | Admitting: Internal Medicine

## 2019-01-30 NOTE — Telephone Encounter (Signed)
Lmom, waiting on a return call.  

## 2019-01-30 NOTE — Telephone Encounter (Signed)
Patient can not afford linzess prescription, she needs to try something else  [phone]

## 2019-02-07 ENCOUNTER — Telehealth: Payer: Self-pay | Admitting: Internal Medicine

## 2019-02-07 NOTE — Telephone Encounter (Signed)
See other note. Message sent to provider.

## 2019-02-07 NOTE — Telephone Encounter (Signed)
Spoke with pt. Pt's insurance covers Linzess but it is over $100.00. pt isn't able to afford the cost and would like to try another medication. Pt has been taking Linzess for over a year.

## 2019-02-07 NOTE — Telephone Encounter (Signed)
PATIENT CALLED INQUIRING ABOUT HER MEDICATION, SAID SHE CALLED LAST WEEK REGARDING IT AND SOMEONE WAS SUPPOSED TO SPEAK TO HER PHYSICIAN ABOUT A CHANGE.  TOLD HER IF IT HAD BEEN RESOLVED THE NURSE WOULD BE IN CONTACT

## 2019-02-08 NOTE — Telephone Encounter (Signed)
Ok to hold until after abx per our discussion.

## 2019-02-08 NOTE — Telephone Encounter (Signed)
EG, pt started antibiotics for a UTI today and wants to know if she should start the Amitiza now or in 10 days after she completes UTI?

## 2019-02-08 NOTE — Telephone Encounter (Signed)
Let's have her pick up a box of Amitiza 24 mcg samples. Take this twice a day WITH FOOD (which will help prevent nausea).  Call in 1-2 weeks and let us know if it helps. If so, we can try an Rx for insurance converage.

## 2019-02-08 NOTE — Telephone Encounter (Signed)
Pt notified of EG's recommendations, samples are ready for pickup.

## 2019-02-15 ENCOUNTER — Telehealth: Payer: Self-pay | Admitting: Internal Medicine

## 2019-02-15 DIAGNOSIS — K5900 Constipation, unspecified: Secondary | ICD-10-CM

## 2019-02-15 NOTE — Telephone Encounter (Signed)
EG, pt called back to discuss the Amitiza. Amitiza is making pt nauseated. Pt is eating with the medication and she d/c after being severely nauseated for 3 days. Pt would like to know if there is anything else she can try. Pt has tried Linzess which works well and Miralax. Pt isn't able to afford the Linzess which is $100 a month.

## 2019-02-15 NOTE — Telephone Encounter (Signed)
Pt said she has tried the Amitiza samples and they are making her sick and not working for her. Is there anything else she can try. She said the Linzess did the same thing. [phone]

## 2019-02-16 NOTE — Telephone Encounter (Signed)
Let's have her try Trulance 3 mg daily with or without food. Seh can pick up samples if we have them. Otherwise, let me know, and I will send in a 30 day Rx.

## 2019-02-19 NOTE — Telephone Encounter (Signed)
No samples are available. Please send RX in to pts pharmacy.

## 2019-02-21 MED ORDER — TRULANCE 3 MG PO TABS: 3.0000 mg | ORAL_TABLET | Freq: Every day | ORAL | 1 refills | Status: DC

## 2019-02-21 NOTE — Telephone Encounter (Signed)
Noted  

## 2019-02-21 NOTE — Telephone Encounter (Signed)
Rx sent to pharmacy. Let us know if there's a problem with coverage. May need PA.

## 2019-02-21 NOTE — Addendum Note (Signed)
Addended by: Gordy Levan, [place] A on: 02/21/2019 09:11 AM   Modules accepted: Orders

## 2019-02-28 ENCOUNTER — Telehealth: Payer: Self-pay | Admitting: Internal Medicine

## 2019-02-28 DIAGNOSIS — G4733 Obstructive sleep apnea (adult) (pediatric): Secondary | ICD-10-CM | POA: Diagnosis not present

## 2019-02-28 NOTE — Telephone Encounter (Signed)
FYI Spoke with pt. She likes taking the Linzess but it was too expensive with her insurance company. Pt tried Amitiza and it made her sick. Trulance was called in and also too expensive for pt. I'm mailing pt assistance forms for pt to fill out. Pt states she isn't able to have her bowels move with just Miralax.

## 2019-02-28 NOTE — Telephone Encounter (Signed)
Pt needed to talk to nurse about her medication. Please call [phone]

## 2019-03-01 NOTE — Telephone Encounter (Signed)
Noted, hopefully patient assistance will be approved.

## 2019-03-01 NOTE — Telephone Encounter (Signed)
Noted  

## 2019-03-05 ENCOUNTER — Encounter: Payer: Self-pay | Admitting: Gastroenterology

## 2019-03-05 ENCOUNTER — Other Ambulatory Visit: Payer: Self-pay

## 2019-03-05 ENCOUNTER — Ambulatory Visit (INDEPENDENT_AMBULATORY_CARE_PROVIDER_SITE_OTHER): Payer: Medicare Other | Admitting: Gastroenterology

## 2019-03-05 VITALS — BP 140/88 | HR 67 | Temp 96.8°F | Ht 61.0 in | Wt 181.6 lb

## 2019-03-05 DIAGNOSIS — K219 Gastro-esophageal reflux disease without esophagitis: Secondary | ICD-10-CM

## 2019-03-05 DIAGNOSIS — K5900 Constipation, unspecified: Secondary | ICD-10-CM | POA: Diagnosis not present

## 2019-03-05 NOTE — Patient Instructions (Signed)
1. Linezss 54mg once daily on empty stomach.  2. We will follow up on patient assistance to see the status.  3. We did not have samples of Amitiza 853m to try at this time.  4. Continue Dexilant 6040maily for your reflux. 5. Continue Miralax one capful daily.  6. Try to drink at least 90 ounces of water/noncaffeinated beverages daily (recommended amount is your weight divided by 2 in ounces ie 180 pounds divided by 2 = 90 ounces). 7. Return to the office in six months.

## 2019-03-05 NOTE — Assessment & Plan Note (Signed)
Doing much better on Dexilant. Continue current regimen. Return to the office in six months or sooner if needed.

## 2019-03-05 NOTE — Progress Notes (Signed)
Primary Care Physician: Raiford Simmonds., PA-C  Primary Gastroenterologist:  Garfield Cornea, MD   Chief Complaint  Patient presents with  . Bloated  . Gastroesophageal Reflux  . Constipation    HPI: Victoria Deleon is a 53 y.o. female here with history of small bowel Crohn's disease diagnosed in [place] around 2006.  Patient reports that she was started on mesalamine but was scared to take it, after a nurse advised her against it.  She reports that she changed her diet, used aloe vera juice, had a repeat colonoscopy which showed improvement of small bowel mucosa.  EGD locally completed 04/24/2018 which found a rather severe ulcerative reflux esophagitis, small hiatal hernia, otherwise normal.  Biopsies from duodenal mucosa without significant histopathological changes.  Recommended begin pantoprazole 40 mg daily.  Colonoscopy completed the same day, normal TI,entire examined colon normal, internal hemorrhoids, status post ileal biopsy which found mucosa without significant histopathological changes and colon without significant histopathological changes.  Recommended repeat colonoscopy in 10 years.  Here to follow-up on GERD, constipation, bloating.   Reflux well controlled on Dexilant. Continues to have issues with constipation/bloating. Miralax by itself is not effective, even at two doses a day. Tried adding MOM at bedtime but still no results. She is a vegetarian. Eats alor of fiber. Drinks 40 ounces of water daily, unsweetened tea, one cup of coffee, one cup of hot herbal tea daily. Goes 3-4 days without a BM. [place] 1 when she goes and no relief in bloating. No melena, brbpr.   Amitiza 39mg daily with food made her nauseated. Happened with four doses, even if took only one per day. Trulance helped but good not afford. Linzess 737m daily works but too expensive. She has completed patient assistance forms and is awaiting to see if approved.       Current Outpatient Medications   Medication Sig Dispense Refill  . atorvastatin (LIPITOR) 40 MG tablet Take 40 mg by mouth at bedtime.   6  . busPIRone (BUSPAR) 10 MG tablet Take 10 mg by mouth 3 (three) times daily.  3  . carboxymethylcellul-glycerin (OPTIVE) 0.5-0.9 % ophthalmic solution Place 1 drop into both eyes 3 (three) times daily.    . citalopram (CELEXA) 20 MG tablet Take 20 mg by mouth daily.  3  . Dexlansoprazole (DEXILANT PO) Take by mouth daily.    . Marland Kitchenoxepin (SINEQUAN) 10 MG capsule Take 10 mg by mouth daily.  3  . FIBER PO Take 1 capsule by mouth 2 (two) times daily.     . Marland Kitchenevothyroxine (SYNTHROID, LEVOTHROID) 150 MCG tablet Take 150 mcg by mouth daily before breakfast.   2  . Multiple Vitamins-Minerals (OCUVITE-LUTEIN PO) Take 1 tablet by mouth 2 (two) times daily.     . naproxen sodium (ALEVE) 220 MG tablet Take 440 mg by mouth daily as needed (for pain or headache).    . polyethylene glycol (MIRALAX / GLYCOLAX) packet Take 17 g by mouth daily.     No current facility-administered medications for this visit.     Allergies as of 03/05/2019 - Review Complete 03/05/2019  Allergen Reaction Noted  . Codeine Anaphylaxis 06/02/2017    ROS:  General: Negative for anorexia, weight loss, fever, chills, fatigue, weakness. ENT: Negative for hoarseness, difficulty swallowing , nasal congestion. CV: Negative for chest pain, angina, palpitations, dyspnea on exertion, peripheral edema.  Respiratory: Negative for dyspnea at rest, dyspnea on exertion, cough, sputum, wheezing.  GI: See history of present illness. GU:  Negative for dysuria, hematuria, urinary incontinence, urinary frequency, nocturnal urination.  Endo: Negative for unusual weight change.    Physical Examination:   BP 140/88   Pulse 67   Temp (!) 96.8 F (36 C) (Temporal)   Ht 5' 1"  (1.549 m)   Wt 181 lb 9.6 oz (82.4 kg)   BMI 34.31 kg/m   General: Well-nourished, well-developed in no acute distress.  Eyes: No icterus. Mouth: Oropharyngeal  mucosa moist and pink , no lesions erythema or exudate. Lungs: Clear to auscultation bilaterally.  Heart: Regular rate and rhythm, no murmurs rubs or gallops.  Abdomen: Bowel sounds are normal, nontender, nondistended, no hepatosplenomegaly or masses, no abdominal bruits or hernia , no rebound or guarding.   Extremities: No lower extremity edema. No clubbing or deformities. Neuro: Alert and oriented x 4   Skin: Warm and dry, no jaundice.   Psych: Alert and cooperative, normal mood and affect.  Imaging Studies: No results found.

## 2019-03-05 NOTE — Assessment & Plan Note (Signed)
Linzess 77mg controls symptoms but she cannot afford. Trying to get patient assistance. Will follow up on status. Samples provided today. Other option would be to see if she would tolerated lower dosage of Amitiza, 813m bid. Continue miralax one capful once to twice daily. Increase water/noncaffeinated beverage consumption to 90 ounces per day. Return to office in six months or sooner if needed.

## 2019-03-12 ENCOUNTER — Telehealth: Payer: Self-pay

## 2019-03-12 NOTE — Telephone Encounter (Signed)
Pt returned call. Pt will stop by the office today 03/12/2019 and sign forms

## 2019-03-12 NOTE — Telephone Encounter (Signed)
Lmom, waiting on a return call. Pt brought pt assistance forms by for Linzess and forgot to sign page 3. Will discuss with pt when she calls back.

## 2019-04-12 DIAGNOSIS — N3091 Cystitis, unspecified with hematuria: Secondary | ICD-10-CM | POA: Diagnosis not present

## 2019-04-12 DIAGNOSIS — E039 Hypothyroidism, unspecified: Secondary | ICD-10-CM | POA: Diagnosis not present

## 2019-04-12 DIAGNOSIS — E7849 Other hyperlipidemia: Secondary | ICD-10-CM | POA: Diagnosis not present

## 2019-04-12 DIAGNOSIS — R5383 Other fatigue: Secondary | ICD-10-CM | POA: Diagnosis not present

## 2019-05-08 ENCOUNTER — Telehealth: Payer: Self-pay | Admitting: Internal Medicine

## 2019-05-08 NOTE — Telephone Encounter (Signed)
Pt was calling to see if she could get more samples of Linzess 5m. Please advise. [phone]

## 2019-05-08 NOTE — Telephone Encounter (Signed)
Spoke with pt. Samples are ready for pickup.

## 2019-07-09 ENCOUNTER — Other Ambulatory Visit: Payer: Self-pay | Admitting: Orthopedic Surgery

## 2019-07-09 ENCOUNTER — Ambulatory Visit (INDEPENDENT_AMBULATORY_CARE_PROVIDER_SITE_OTHER): Payer: Medicare Other | Admitting: Orthopedic Surgery

## 2019-07-09 ENCOUNTER — Other Ambulatory Visit: Payer: Self-pay

## 2019-07-09 ENCOUNTER — Ambulatory Visit: Payer: Medicare Other

## 2019-07-09 ENCOUNTER — Encounter: Payer: Self-pay | Admitting: Orthopedic Surgery

## 2019-07-09 VITALS — BP 124/78 | HR 82 | Ht 61.0 in | Wt 180.0 lb

## 2019-07-09 DIAGNOSIS — M25562 Pain in left knee: Secondary | ICD-10-CM

## 2019-07-09 DIAGNOSIS — G8929 Other chronic pain: Secondary | ICD-10-CM

## 2019-07-09 DIAGNOSIS — M79605 Pain in left leg: Secondary | ICD-10-CM | POA: Diagnosis not present

## 2019-07-09 DIAGNOSIS — M47816 Spondylosis without myelopathy or radiculopathy, lumbar region: Secondary | ICD-10-CM | POA: Diagnosis not present

## 2019-07-09 DIAGNOSIS — Z96652 Presence of left artificial knee joint: Secondary | ICD-10-CM

## 2019-07-09 DIAGNOSIS — M25561 Pain in right knee: Secondary | ICD-10-CM

## 2019-07-09 MED ORDER — GABAPENTIN 100 MG PO CAPS: 100.0000 mg | ORAL_CAPSULE | Freq: Three times a day (TID) | ORAL | 2 refills | Status: DC

## 2019-07-09 NOTE — Progress Notes (Signed)
Chief Complaint  Patient presents with  . Knee Pain    right / has had pain for a year history of knee replacement 28 yrs ago     54 year old female presents with history of unicondylar knee replacement left knee 12 years ago in [place] complaining of activity related pain and intermittent swelling related to increased activity with pain in her left knee crepitance in the left knee  This is associated with or in addition to pain in the lateral side of the left distal thigh and proximal lower leg with occasional numbness and tingling on the outer side of the left leg and thigh  Denies any history of back pain no history of recent fever malaise erythema of the knee joint.  She does note that she has had tingling in the leg for the last 5 months and cramping of the thigh  She says she is very active but can no longer walk the same distance so she has taken up swimming.  She said the left knee surgery went reasonably well she got partial pain relief but not complete she is always noticed that it was tight  Patient notes that she cannot sit or stand very long and gets more discomfort in the left leg at night.  Review of Systems  Constitutional: Negative for chills, fever and malaise/fatigue.  All other systems reviewed and are negative.  Past Medical History:  Diagnosis Date  . Anxiety   . Crohn disease ([place])   . History of kidney stones   . Hypertension   . Hypothyroidism   . Macular degeneration    Past Surgical History:  Procedure Laterality Date  . ABDOMINAL HYSTERECTOMY    . APPENDECTOMY    . BIOPSY  04/24/2018   Procedure: BIOPSY;  Surgeon: Daneil Dolin, MD;  Location: [facility];  Service: Endoscopy;;  (gastric/duodenal)  . CHOLECYSTECTOMY    . COLONOSCOPY WITH PROPOFOL N/A 04/24/2018   Dr. Gala Romney: Normal terminal ileum with negative biopsies, normal-appearing colon with random colon biopsies negative, internal hemorrhoids.  Next colonoscopy 10 years.  .  ESOPHAGOGASTRODUODENOSCOPY (EGD) WITH PROPOFOL N/A 04/24/2018   Dr. Gala Romney: Severe ulcerative reflux esophagitis, small hiatal hernia, small bowel biopsies negative for celiac.  Marland Kitchen MEDIAL PARTIAL KNEE REPLACEMENT Left    Family History  Adopted: Yes  Problem Relation Age of Onset  . Colon cancer Neg Hx    Social History   Tobacco Use  . Smoking status: Current Every Day Smoker    Packs/day: 0.25    Years: 30.00    Pack years: 7.50    Types: Cigarettes  . Smokeless tobacco: Never Used  Substance Use Topics  . Alcohol use: Yes    Comment: rare-maybe 2 glasses of wine/year  . Drug use: Never    BP 124/78   Pulse 82   Ht 5' 1"  (1.549 m)   Wt 180 lb (81.6 kg)   BMI 34.01 kg/m   Her appearance is normal she is of normal body habitus she is awake alert and oriented x3 mood and affect are pleasant her gait is normal without assistive device there is no limping  Her left knee shows a small incision from the top of the patella to the tibial tubercle and then another incision right at the inferior portion of the incision that goes proximally and medially  She has no effusion today she says the knee is down today but it was swollen a few days ago.  She has crepitance and pain  all around the joint with palpable tenderness medial lateral joint line medial and lateral periarticular and patellar regions  Her knee flexion is measured by goniometer of 115 degrees with full extension and she has painful range of motion at terminal knee extension and terminal flexion  She has a negative right straight leg raise with a positive left straight leg raise  She has decreased sensation in L4 and L5 including top of the foot and lateral and medial leg measured with a pinwheel and there is a difference left to right  She has mild tenderness on the left side of the lower back with painful extension   X-rays were done in the office she has a lumbar scoliosis with spondylosis and loss of normal lumbar  lordosis  The knee film shows a unicondylar knee replacement I cannot comment on whether there is wear as I have no other films the alignment is 0 to 1 to 2 degrees of varus  Encounter Diagnoses  Name Primary?  . Status post left partial knee replacement   . Pain in left leg   . Lumbar spondylosis Yes     Recommend referral to a joint replacement specialist to evaluate for possible revision left knee  MRI lumbar spine Meds ordered this encounter  Medications  . gabapentin (NEURONTIN) 100 MG capsule    Sig: Take 1 capsule (100 mg total) by mouth 3 (three) times daily.    Dispense:  90 capsule    Refill:  2

## 2019-07-09 NOTE — Patient Instructions (Addendum)
Referral for left knee revision   Start gabapentin for numbness   Radicular Pain Radicular pain is a type of pain that spreads from your back or neck along a spinal nerve. Spinal nerves are nerves that leave the spinal cord and go to the muscles. Radicular pain is sometimes called radiculopathy, radiculitis, or a pinched nerve. When you have this type of pain, you may also have weakness, numbness, or tingling in the area of your body that is supplied by the nerve. The pain may feel sharp and burning. Depending on which spinal nerve is affected, the pain may occur in the:  Neck area (cervical radicular pain). You may also feel pain, numbness, weakness, or tingling in the arms.  Mid-spine area (thoracic radicular pain). You would feel this pain in the back and chest. This type is rare.  Lower back area (lumbar radicular pain). You would feel this pain as low back pain. You may feel pain, numbness, weakness, or tingling in the buttocks or legs. Sciatica is a type of lumbar radicular pain that shoots down the back of the leg. Radicular pain occurs when one of the spinal nerves becomes irritated or squeezed (compressed). It is often caused by something pushing on a spinal nerve, such as one of the bones of the spine (vertebrae) or one of the round cushions between vertebrae (intervertebral disks). This can result from:  An injury.  Wear and tear or aging of a disk.  The growth of a bone spur that pushes on the nerve. Radicular pain often goes away when you follow instructions from your health care provider for relieving pain at home. Follow these instructions at home: Managing pain      If directed, put ice on the affected area: ? Put ice in a plastic bag. ? Place a towel between your skin and the bag. ? Leave the ice on for 20 minutes, 2-3 times a day.  If directed, apply heat to the affected area as often as told by your health care provider. Use the heat source that your health care  provider recommends, such as a moist heat pack or a heating pad. ? Place a towel between your skin and the heat source. ? Leave the heat on for 20-30 minutes. ? Remove the heat if your skin turns bright red. This is especially important if you are unable to feel pain, heat, or cold. You may have a greater risk of getting burned. Activity   Do not sit or rest in bed for long periods of time.  Try to stay as active as possible. Ask your health care provider what type of exercise or activity is best for you.  Avoid activities that make your pain worse, such as bending and lifting.  Do not lift anything that is heavier than 10 lb (4.5 kg), or the limit that you are told, until your health care provider says that it is safe.  Practice using proper technique when lifting items. Proper lifting technique involves bending your knees and rising up.  Do strength and range-of-motion exercises only as told by your health care provider or physical therapist. General instructions  Take over-the-counter and prescription medicines only as told by your health care provider.  Pay attention to any changes in your symptoms.  Keep all follow-up visits as told by your health care provider. This is important. ? Your health care provider may send you to a physical therapist to help with this pain. Contact a health care provider if:  Your pain and other symptoms get worse.  Your pain medicine is not helping.  Your pain has not improved after a few weeks of home care.  You have a fever. Get help right away if:  You have severe pain, weakness, or numbness.  You have difficulty with bladder or bowel control. Summary  Radicular pain is a type of pain that spreads from your back or neck along a spinal nerve.  When you have radicular pain, you may also have weakness, numbness, or tingling in the area of your body that is supplied by the nerve.  The pain may feel sharp or burning.  Radicular pain may  be treated with ice, heat, medicines, or physical therapy. This information is not intended to replace advice given to you by your health care provider. Make sure you discuss any questions you have with your health care provider. Document Revised: 11/29/2017 Document Reviewed: 11/29/2017 Elsevier Patient Education  [place].

## 2019-07-11 ENCOUNTER — Telehealth: Payer: Self-pay

## 2019-07-11 NOTE — Telephone Encounter (Signed)
Pt walked in 07/10/2019 @ 4:00 PM for Linzess 72 mcg samples. Samples arrived after pt walked in office. Left a detailed message for pt. Samples are available and ready to be picked up.

## 2019-07-24 DIAGNOSIS — M25562 Pain in left knee: Secondary | ICD-10-CM | POA: Diagnosis not present

## 2019-07-25 DIAGNOSIS — I10 Essential (primary) hypertension: Secondary | ICD-10-CM | POA: Diagnosis not present

## 2019-07-25 DIAGNOSIS — E7849 Other hyperlipidemia: Secondary | ICD-10-CM | POA: Diagnosis not present

## 2019-07-25 DIAGNOSIS — E038 Other specified hypothyroidism: Secondary | ICD-10-CM | POA: Diagnosis not present

## 2019-07-25 DIAGNOSIS — K2100 Gastro-esophageal reflux disease with esophagitis, without bleeding: Secondary | ICD-10-CM | POA: Diagnosis not present

## 2019-08-07 ENCOUNTER — Other Ambulatory Visit: Payer: Self-pay

## 2019-08-07 ENCOUNTER — Ambulatory Visit
Admission: RE | Admit: 2019-08-07 | Discharge: 2019-08-07 | Disposition: A | Discharge status: Home / Self Care | Payer: Medicare HMO | Source: Ambulatory Visit | Attending: Orthopedic Surgery | Admitting: Orthopedic Surgery

## 2019-08-07 DIAGNOSIS — M48061 Spinal stenosis, lumbar region without neurogenic claudication: Secondary | ICD-10-CM | POA: Diagnosis not present

## 2019-08-07 DIAGNOSIS — M47816 Spondylosis without myelopathy or radiculopathy, lumbar region: Secondary | ICD-10-CM

## 2019-08-08 DIAGNOSIS — Z0000 Encounter for general adult medical examination without abnormal findings: Secondary | ICD-10-CM | POA: Diagnosis not present

## 2019-08-08 DIAGNOSIS — I10 Essential (primary) hypertension: Secondary | ICD-10-CM | POA: Diagnosis not present

## 2019-08-08 DIAGNOSIS — R7303 Prediabetes: Secondary | ICD-10-CM | POA: Diagnosis not present

## 2019-08-08 DIAGNOSIS — Z6834 Body mass index (BMI) 34.0-34.9, adult: Secondary | ICD-10-CM | POA: Diagnosis not present

## 2019-08-14 ENCOUNTER — Telehealth: Payer: Self-pay | Admitting: Orthopedic Surgery

## 2019-08-14 NOTE — Telephone Encounter (Signed)
Patient called relaying that she missed Dr Ruthe Mannan call regarding her MRI results; [phone]

## 2019-08-14 NOTE — Telephone Encounter (Signed)
Spinal stenosis needs referral

## 2019-08-15 ENCOUNTER — Other Ambulatory Visit: Payer: Self-pay | Admitting: Orthopedic Surgery

## 2019-08-16 NOTE — Telephone Encounter (Signed)
Patient is calling back regarding MRI results - said mornings are good for reaching her - ph# [phone]

## 2019-08-17 ENCOUNTER — Telehealth: Payer: Self-pay | Admitting: Radiology

## 2019-08-17 DIAGNOSIS — M47816 Spondylosis without myelopathy or radiculopathy, lumbar region: Secondary | ICD-10-CM

## 2019-08-17 NOTE — Telephone Encounter (Signed)
Put in order for the ortho referral and ESI for there also

## 2019-08-17 NOTE — Telephone Encounter (Signed)
-----   Message from Carole Civil, MD sent at 08/16/2019  1:56 PM EDT ----- Set up lumbar esi   [facility]  [facility] ( she is getting a tka there)

## 2019-08-29 NOTE — Patient Instructions (Addendum)
DUE TO COVID-19 ONLY ONE VISITOR IS ALLOWED TO COME WITH YOU AND STAY IN THE WAITING ROOM ONLY DURING PRE OP AND PROCEDURE DAY OF SURGERY. THE 1 VISITOR MAY VISIT WITH YOU AFTER SURGERY IN YOUR PRIVATE ROOM DURING VISITING HOURS ONLY!  YOU NEED TO HAVE A COVID 19 TEST ON: 09/07/19 @ 2:00 pm , THIS TEST MUST BE DONE BEFORE SURGERY, COME  [place], [place] , [address].  ([place]) ONCE YOUR COVID TEST IS COMPLETED, PLEASE BEGIN THE QUARANTINE INSTRUCTIONS AS OUTLINED IN YOUR HANDOUT.                Catalina Salasar   Your procedure is scheduled on: 09/10/19   Report to [facility] Main  Entrance   Report to admitting at: 6:15 AM     Call this number if you have problems the morning of surgery [phone]    Remember:    [place], NO [place].     Take these medicines the morning of surgery with A SIP OF WATER:   buspirone,citalopram,duloxetine,gabapentin,levothyroxine,metoprolol,pantoprazole,linaclotide.                                 You may not have any metal on your body including hair pins and              piercings  Do not wear jewelry, make-up, lotions, powders or perfumes, deodorant             Do not wear nail polish on your fingernails.  Do not shave  48 hours prior to surgery.               Do not bring valuables to the hospital. [place].  Contacts, dentures or bridgework may not be worn into surgery.  Leave suitcase in the car. After surgery it may be brought to your room.     Patients discharged the day of surgery will not be allowed to drive home. IF YOU ARE HAVING SURGERY AND GOING HOME THE SAME DAY, YOU MUST HAVE AN ADULT TO DRIVE YOU HOME AND BE WITH YOU FOR 24 HOURS. YOU MAY GO HOME BY TAXI OR UBER OR ORTHERWISE, BUT AN ADULT MUST ACCOMPANY YOU HOME AND STAY WITH YOU FOR 24 HOURS.  Name and phone number of your  driver:  Special Instructions: N/A              Please read over the following fact sheets you were given: _____________________________________________________________________             NO SOLID FOOD AFTER MIDNIGHT THE NIGHT PRIOR TO SURGERY. NOTHING BY MOUTH EXCEPT CLEAR LIQUIDS UNTIL: 5:45 am . PLEASE FINISH ENSURE DRINK PER SURGEON ORDER  WHICH NEEDS TO BE COMPLETED AT : 5:45 am.   CLEAR LIQUID DIET   Foods Allowed                                                                     Foods Excluded  Coffee and tea, regular and decaf                             liquids that you cannot  Plain Jell-O any favor except red or purple                                           see through such as: Fruit ices (not with fruit pulp)                                     milk, soups, orange juice  Iced Popsicles                                    All solid food Carbonated beverages, regular and diet                                    Cranberry, grape and apple juices Sports drinks like Gatorade Lightly seasoned clear broth or consume(fat free) Sugar, honey syrup  Sample Menu Breakfast                                Lunch                                     Supper Cranberry juice                    Beef broth                            Chicken broth Jell-O                                     Grape juice                           Apple juice Coffee or tea                        Jell-O                                      Popsicle                                                Coffee or tea                        Coffee or tea  _____________________________________________________________________  [facility] - Preparing for Surgery Before surgery, you can play an important role.  Because skin is not sterile, your skin needs to be  as free of germs as possible.  You can reduce the number of germs on your skin by washing with CHG (chlorahexidine gluconate) soap before surgery.  CHG is an  antiseptic cleaner which kills germs and bonds with the skin to continue killing germs even after washing. Please DO NOT use if you have an allergy to CHG or antibacterial soaps.  If your skin becomes reddened/irritated stop using the CHG and inform your nurse when you arrive at Short Stay. Do not shave (including legs and underarms) for at least 48 hours prior to the first CHG shower.  You may shave your face/neck. Please follow these instructions carefully:  1.  Shower with CHG Soap the night before surgery and the  morning of Surgery.  2.  If you choose to wash your hair, wash your hair first as usual with your  normal  shampoo.  3.  After you shampoo, rinse your hair and body thoroughly to remove the  shampoo.                           4.  Use CHG as you would any other liquid soap.  You can apply chg directly  to the skin and wash                       Gently with a scrungie or clean washcloth.  5.  Apply the CHG Soap to your body ONLY FROM THE NECK DOWN.   Do not use on face/ open                           Wound or open sores. Avoid contact with eyes, ears mouth and genitals (private parts).                       Wash face,  Genitals (private parts) with your normal soap.             6.  Wash thoroughly, paying special attention to the area where your surgery  will be performed.  7.  Thoroughly rinse your body with warm water from the neck down.  8.  DO NOT shower/wash with your normal soap after using and rinsing off  the CHG Soap.                9.  Pat yourself dry with a clean towel.            10.  Wear clean pajamas.            11.  Place clean sheets on your bed the night of your first shower and do not  sleep with pets. Day of Surgery : Do not apply any lotions/deodorants the morning of surgery.  Please wear clean clothes to the hospital/surgery center.  FAILURE TO FOLLOW THESE INSTRUCTIONS MAY RESULT IN THE CANCELLATION OF YOUR SURGERY PATIENT  SIGNATURE_________________________________  NURSE SIGNATURE__________________________________  ________________________________________________________________________   Adam Phenix  An incentive spirometer is a tool that can help keep your lungs clear and active. This tool measures how well you are filling your lungs with each breath. Taking long deep breaths may help reverse or decrease the chance of developing breathing (pulmonary) problems (especially infection) following:  A long period of time when you are unable to move or be active. BEFORE THE PROCEDURE   If the spirometer includes an indicator to show your best  effort, your nurse or respiratory therapist will set it to a desired goal.  If possible, sit up straight or lean slightly forward. Try not to slouch.  Hold the incentive spirometer in an upright position. INSTRUCTIONS FOR USE  1. Sit on the edge of your bed if possible, or sit up as far as you can in bed or on a chair. 2. Hold the incentive spirometer in an upright position. 3. Breathe out normally. 4. Place the mouthpiece in your mouth and seal your lips tightly around it. 5. Breathe in slowly and as deeply as possible, raising the piston or the ball toward the top of the column. 6. Hold your breath for 3-5 seconds or for as long as possible. Allow the piston or ball to fall to the bottom of the column. 7. Remove the mouthpiece from your mouth and breathe out normally. 8. Rest for a few seconds and repeat Steps 1 through 7 at least 10 times every 1-2 hours when you are awake. Take your time and take a few normal breaths between deep breaths. 9. The spirometer may include an indicator to show your best effort. Use the indicator as a goal to work toward during each repetition. 10. After each set of 10 deep breaths, practice coughing to be sure your lungs are clear. If you have an incision (the cut made at the time of surgery), support your incision when coughing  by placing a pillow or rolled up towels firmly against it. Once you are able to get out of bed, walk around indoors and cough well. You may stop using the incentive spirometer when instructed by your caregiver.  RISKS AND COMPLICATIONS  Take your time so you do not get dizzy or light-headed.  If you are in pain, you may need to take or ask for pain medication before doing incentive spirometry. It is harder to take a deep breath if you are having pain. AFTER USE  Rest and breathe slowly and easily.  It can be helpful to keep track of a log of your progress. Your caregiver can provide you with a simple table to help with this. If you are using the spirometer at home, follow these instructions: [place] IF:   You are having difficultly using the spirometer.  You have trouble using the spirometer as often as instructed.  Your pain medication is not giving enough relief while using the spirometer.  You develop fever of 100.5 F (38.1 C) or higher. SEEK IMMEDIATE MEDICAL CARE IF:   You cough up bloody sputum that had not been present before.  You develop fever of 102 F (38.9 C) or greater.  You develop worsening pain at or near the incision site. MAKE SURE YOU:   Understand these instructions.  Will watch your condition.  Will get help right away if you are not doing well or get worse. Document Released: 09/27/2006 Document Revised: 08/09/2011 Document Reviewed: 11/28/2006 [facility] Patient Information 2014 [place], [place].   ________________________________________________________________________

## 2019-08-30 ENCOUNTER — Encounter (HOSPITAL_COMMUNITY)
Admission: RE | Admit: 2019-08-30 | Discharge: 2019-08-30 | Disposition: A | Discharge status: Home / Self Care | Payer: Medicare HMO | Source: Ambulatory Visit | Attending: Orthopedic Surgery | Admitting: Orthopedic Surgery

## 2019-08-30 ENCOUNTER — Other Ambulatory Visit: Payer: Self-pay

## 2019-08-30 ENCOUNTER — Encounter (HOSPITAL_COMMUNITY): Payer: Self-pay

## 2019-08-30 ENCOUNTER — Ambulatory Visit (HOSPITAL_COMMUNITY)
Admission: RE | Admit: 2019-08-30 | Discharge: 2019-08-30 | Disposition: A | Discharge status: Home / Self Care | Payer: Medicare HMO | Source: Ambulatory Visit | Attending: Orthopedic Surgery | Admitting: Orthopedic Surgery

## 2019-08-30 DIAGNOSIS — R9431 Abnormal electrocardiogram [ECG] [EKG]: Secondary | ICD-10-CM | POA: Diagnosis not present

## 2019-08-30 DIAGNOSIS — Z96659 Presence of unspecified artificial knee joint: Secondary | ICD-10-CM | POA: Diagnosis not present

## 2019-08-30 DIAGNOSIS — Z01818 Encounter for other preprocedural examination: Secondary | ICD-10-CM | POA: Insufficient documentation

## 2019-08-30 HISTORY — DX: Post-traumatic stress disorder, unspecified: F43.10

## 2019-08-30 HISTORY — DX: Depression, unspecified: F32.A

## 2019-08-30 HISTORY — DX: Unspecified osteoarthritis, unspecified site: M19.90

## 2019-08-30 LAB — BASIC METABOLIC PANEL
Anion gap: 9 (ref 5–15)
BUN: 11 mg/dL (ref 6–20)
CO2: 25 mmol/L (ref 22–32)
Calcium: 9 mg/dL (ref 8.9–10.3)
Chloride: 99 mmol/L (ref 98–111)
Creatinine, Ser: 0.83 mg/dL (ref 0.44–1.00)
GFR calc Af Amer: >60 mL/min (ref >60)
GFR calc non Af Amer: >60 mL/min (ref >60)
Glucose, Bld: 114 mg/dL — ABNORMAL HIGH (ref 70–99)
Potassium: 4.5 mmol/L (ref 3.5–5.1)
Sodium: 133 mmol/L — ABNORMAL LOW (ref 135–145)

## 2019-08-30 LAB — CBC WITH DIFFERENTIAL/PLATELET
Abs Immature Granulocytes: 0.01 10*3/uL (ref 0.00–0.07)
Basophils Absolute: 0.1 10*3/uL (ref 0.0–0.1)
Basophils Relative: 1 %
Eosinophils Absolute: 0.4 10*3/uL (ref 0.0–0.5)
Eosinophils Relative: 6 %
HCT: 46.4 % — ABNORMAL HIGH (ref 36.0–46.0)
Hemoglobin: 15.6 g/dL — ABNORMAL HIGH (ref 12.0–15.0)
Immature Granulocytes: 0 %
Lymphocytes Relative: 26 %
Lymphs Abs: 1.6 10*3/uL (ref 0.7–4.0)
MCH: 30.4 pg (ref 26.0–34.0)
MCHC: 33.6 g/dL (ref 30.0–36.0)
MCV: 90.3 fL (ref 80.0–100.0)
Monocytes Absolute: 0.7 10*3/uL (ref 0.1–1.0)
Monocytes Relative: 11 %
Neutro Abs: 3.5 10*3/uL (ref 1.7–7.7)
Neutrophils Relative %: 56 %
Platelets: 290 10*3/uL (ref 150–400)
RBC: 5.14 MIL/uL — ABNORMAL HIGH (ref 3.87–5.11)
RDW: 13.1 % (ref 11.5–15.5)
WBC: 6.2 10*3/uL (ref 4.0–10.5)
nRBC: 0 % (ref 0.0–0.2)

## 2019-08-30 LAB — TYPE AND SCREEN
ABO/RH(D): A POS
Antibody Screen: NEGATIVE

## 2019-08-30 LAB — URINALYSIS, ROUTINE W REFLEX MICROSCOPIC
Bilirubin Urine: NEGATIVE
Glucose, UA: NEGATIVE mg/dL
Hgb urine dipstick: NEGATIVE
Ketones, ur: NEGATIVE mg/dL
Nitrite: NEGATIVE
Protein, ur: NEGATIVE mg/dL
Specific Gravity, Urine: 1.006 (ref 1.005–1.030)
pH: 6 (ref 5.0–8.0)

## 2019-08-30 LAB — PROTIME-INR
INR: 0.9 (ref 0.8–1.2)
Prothrombin Time: 12.3 seconds (ref 11.4–15.2)

## 2019-08-30 LAB — SURGICAL PCR SCREEN
MRSA, PCR: NEGATIVE
Staphylococcus aureus: NEGATIVE

## 2019-08-30 LAB — ABO/RH: ABO/RH(D): A POS

## 2019-08-30 LAB — APTT: aPTT: 29 seconds (ref 24–36)

## 2019-08-30 NOTE — Progress Notes (Signed)
PCP - Dr. Stoney Bang. LOV: 07/09/19 Cardiologist - Dr. Duard Brady  Chest x-ray -  EKG -  Stress Test -  ECHO -  Cardiac Cath -   Sleep Study -  CPAP -   Fasting Blood Sugar -  Checks Blood Sugar _____ times a day  Blood Thinner Instructions: Aspirin Instructions: Last Dose:  Anesthesia review:   Patient denies shortness of breath, fever, cough and chest pain at PAT appointment   Patient verbalized understanding of instructions that were given to them at the PAT appointment. Patient was also instructed that they will need to review over the PAT instructions again at home before surgery.

## 2019-09-05 ENCOUNTER — Ambulatory Visit: Payer: Medicare Other | Admitting: Gastroenterology

## 2019-09-06 DIAGNOSIS — T8484XA Pain due to internal orthopedic prosthetic devices, implants and grafts, initial encounter: Secondary | ICD-10-CM

## 2019-09-06 DIAGNOSIS — Z96659 Presence of unspecified artificial knee joint: Secondary | ICD-10-CM

## 2019-09-06 HISTORY — DX: Pain due to internal orthopedic prosthetic devices, implants and grafts, initial encounter: T84.84XA

## 2019-09-06 HISTORY — DX: Presence of unspecified artificial knee joint: Z96.659

## 2019-09-06 NOTE — H&P (Signed)
TOTAL KNEE REVISION ADMISSION H&P  Patient is being admitted for left revision total knee arthroplasty.  Subjective:  Chief Complaint:left knee pain.  HPI: Victoria Deleon, 54 y.o. female, has a history of pain and functional disability in the left knee(s) due to failed previous arthroplasty and patient has failed non-surgical conservative treatments for greater than 12 weeks to include NSAID's and/or analgesics, weight reduction as appropriate and activity modification. The indications for the revision of the total knee arthroplasty are bearing surface wear leading to symptomatic synovitis and implant or knee misalignment. Onset of symptoms was gradual starting 1 years ago with gradually worsening course since that time.  Prior procedures on the left knee(s) include unicompartmental arthroplasty.  Patient currently rates pain in the left knee(s) at 10 out of 10 with activity. There is night pain, worsening of pain with activity and weight bearing, pain that interferes with activities of daily living, pain with passive range of motion, crepitus and joint swelling.  Patient has evidence of joint space narrowing by imaging studies. This condition presents safety issues increasing the risk of falls.   There is no current active infection.  Patient Active Problem List   Diagnosis Date Noted  . GERD (gastroesophageal reflux disease) 07/27/2018  . Constipation 07/27/2018  . Nonspecific abnormal electrocardiogram (ECG) (EKG) 07/27/2018  . Crohn's disease ([place]) 02/13/2018   Past Medical History:  Diagnosis Date  . Anxiety   . Arthritis   . Crohn disease ([place])   . Depression   . History of kidney stones   . Hypertension   . Hypothyroidism   . Macular degeneration   . PTSD (post-traumatic stress disorder)     Past Surgical History:  Procedure Laterality Date  . ABDOMINAL HYSTERECTOMY    . APPENDECTOMY    . BIOPSY  04/24/2018   Procedure: BIOPSY;  Surgeon: Daneil Dolin, MD;  Location: [facility];  Service: Endoscopy;;  (gastric/duodenal)  . CHOLECYSTECTOMY    . COLONOSCOPY WITH PROPOFOL N/A 04/24/2018   Dr. Gala Romney: Normal terminal ileum with negative biopsies, normal-appearing colon with random colon biopsies negative, internal hemorrhoids.  Next colonoscopy 10 years.  . ESOPHAGOGASTRODUODENOSCOPY (EGD) WITH PROPOFOL N/A 04/24/2018   Dr. Gala Romney: Severe ulcerative reflux esophagitis, small hiatal hernia, small bowel biopsies negative for celiac.  Marland Kitchen MEDIAL PARTIAL KNEE REPLACEMENT Left     No current facility-administered medications for this encounter.   Current Outpatient Medications  Medication Sig Dispense Refill Last Dose  . atorvastatin (LIPITOR) 40 MG tablet Take 40 mg by mouth at bedtime.   6   . busPIRone (BUSPAR) 30 MG tablet Take 30 mg by mouth daily.     . citalopram (CELEXA) 20 MG tablet Take 20 mg by mouth daily.  3   . doxepin (SINEQUAN) 10 MG capsule Take 10 mg by mouth every evening.   3   . DULoxetine (CYMBALTA) 30 MG capsule Take 30 mg by mouth daily.     Marland Kitchen FIBER PO Take 1 capsule by mouth 2 (two) times daily.      Marland Kitchen gabapentin (NEURONTIN) 100 MG capsule Take 1 capsule (100 mg total) by mouth 3 (three) times daily. 90 capsule 2   . levothyroxine (SYNTHROID) 100 MCG tablet Take 100 mcg by mouth every evening.     . linaclotide (LINZESS) 72 MCG capsule Take 72 mcg by mouth daily.     . metoprolol succinate (TOPROL-XL) 25 MG 24 hr tablet Take 25 mg by mouth daily.     . Multiple  Vitamin (MULTIVITAMIN WITH MINERALS) TABS tablet Take 1 tablet by mouth daily.     . Multiple Vitamins-Minerals (PRESERVISION AREDS 2) CAPS Take 1 capsule by mouth in the morning and at bedtime.     . naproxen sodium (ALEVE) 220 MG tablet Take 440 mg by mouth daily as needed (for pain or headache).     . pantoprazole (PROTONIX) 40 MG tablet Take 40 mg by mouth daily.      Allergies  Allergen Reactions  . Codeine Anaphylaxis    Social History   Tobacco Use  . Smoking status: Current  Every Day Smoker    Packs/day: 0.25    Years: 30.00    Pack years: 7.50    Types: Cigarettes  . Smokeless tobacco: Never Used  Substance Use Topics  . Alcohol use: Yes    Comment: rare-maybe 2 glasses of wine/year    Family History  Adopted: Yes  Problem Relation Age of Onset  . Colon cancer Neg Hx       Review of Systems  Constitutional: Negative.   HENT: Negative.   Eyes: Negative.   Respiratory: Negative.   Cardiovascular: Negative.   Gastrointestinal: Negative.   Endocrine: Negative.   Genitourinary: Negative.   Musculoskeletal: Positive for arthralgias.  Allergic/Immunologic: Negative.   Neurological: Negative.   Hematological: Negative.   Psychiatric/Behavioral: The patient is nervous/anxious.      Objective:  Physical Exam  Constitutional: She is oriented to person, place, and time. She appears well-developed and well-nourished.  HENT:  Head: Normocephalic and atraumatic.  Eyes: Pupils are equal, round, and reactive to light.  Cardiovascular: Intact distal pulses.  Respiratory: Effort normal.  Musculoskeletal:        General: Tenderness present.     Cervical back: Normal range of motion and neck supple.     Comments: Surgical scar the left knee is well-healed 1+ effusion very tender along the medial joint line good quadriceps and hamstring power range of motion 0/120 with an ouch.  Varus stress exacerbates her pain.    Neurological: She is alert and oriented to person, place, and time.  Skin: Skin is warm and dry.  Psychiatric: She has a normal mood and affect. Her behavior is normal. Judgment and thought content normal.    Vital signs in last 24 hours:    Labs:  Estimated body mass index is 30.8 kg/m as calculated from the following:   Height as of 08/30/19: 5' 1"  (1.549 m).   Weight as of 08/30/19: 73.9 kg.  Imaging Review Plain radiographs demonstrate  cemented unicompartmental total knee possible fragmentation of bone along the medial tibia just  underneath the implant and a lucency between the implant and the tibial spine.  The femoral implant appears to be in good position.  There does appear to be thinning of the polyethylene bearing itself almost metal-on-metal.    Assessment/Plan:  End stage arthritis, left knee(s) with failed previous arthroplasty.   The patient history, physical examination, clinical judgment of the provider and imaging studies are consistent with end stage degenerative joint disease of the left knee(s), previous total knee arthroplasty. Revision total knee arthroplasty is deemed medically necessary. The treatment options including medical management, injection therapy, arthroscopy and revision arthroplasty were discussed at length. The risks and benefits of revision total knee arthroplasty were presented and reviewed. The risks due to aseptic loosening, infection, stiffness, patella tracking problems, thromboembolic complications and other imponderables were discussed. The patient acknowledged the explanation, agreed to proceed with the plan and  consent was signed. Patient is being admitted for inpatient treatment for surgery, pain control, PT, OT, prophylactic antibiotics, VTE prophylaxis, progressive ambulation and ADL's and discharge planning.The patient is planning to be discharged home with home health services

## 2019-09-07 ENCOUNTER — Inpatient Hospital Stay (HOSPITAL_COMMUNITY): Admission: RE | Admit: 2019-09-07 | Payer: Medicare HMO | Source: Ambulatory Visit

## 2019-09-07 ENCOUNTER — Other Ambulatory Visit (HOSPITAL_COMMUNITY)
Admission: RE | Admit: 2019-09-07 | Discharge: 2019-09-07 | Disposition: A | Discharge status: Home / Self Care | Payer: Medicare HMO | Source: Ambulatory Visit | Attending: Orthopedic Surgery | Admitting: Orthopedic Surgery

## 2019-09-07 DIAGNOSIS — Z01812 Encounter for preprocedural laboratory examination: Secondary | ICD-10-CM | POA: Diagnosis not present

## 2019-09-07 DIAGNOSIS — Z20822 Contact with and (suspected) exposure to covid-19: Secondary | ICD-10-CM | POA: Insufficient documentation

## 2019-09-07 LAB — SARS CORONAVIRUS 2 (TAT 6-24 HRS): SARS Coronavirus 2: NEGATIVE

## 2019-09-07 NOTE — Anesthesia Preprocedure Evaluation (Addendum)
Anesthesia Evaluation  Patient identified by MRN, date of birth, ID band Patient awake    Reviewed: Allergy & Precautions, NPO status , Patient's Chart, lab work & pertinent test results, reviewed documented beta blocker date and time   History of Anesthesia Complications Negative for: history of anesthetic complications  Airway Mallampati: II  TM Distance: >3 FB Neck ROM: Full    Dental  (+) Missing,    Pulmonary Current Smoker and Patient abstained from smoking.,    Pulmonary exam normal        Cardiovascular hypertension, Pt. on home beta blockers and Pt. on medications Normal cardiovascular exam  TTE 07/2018: EF 60-65%, mild LVH   Neuro/Psych Anxiety Depression negative neurological ROS     GI/Hepatic Neg liver ROS, GERD  Medicated and Controlled,Crohn's dx   Endo/Other  Hypothyroidism   Renal/GU negative Renal ROS  negative genitourinary   Musculoskeletal  (+) Arthritis ,   Abdominal   Peds  Hematology negative hematology ROS (+)   Anesthesia Other Findings Day of surgery medications reviewed with patient.  Reproductive/Obstetrics negative OB ROS                           Anesthesia Physical Anesthesia Plan  ASA: II  Anesthesia Plan: Spinal   Post-op Pain Management:    Induction:   PONV Risk Score and Plan: 2 and Treatment may vary due to age or medical condition, Ondansetron, Dexamethasone and Midazolam  Airway Management Planned: Natural Airway and Simple Face Mask  Additional Equipment: None  Intra-op Plan:   Post-operative Plan:   Informed Consent: I have reviewed the patients History and Physical, chart, labs and discussed the procedure including the risks, benefits and alternatives for the proposed anesthesia with the patient or authorized representative who has indicated his/her understanding and acceptance.       Plan Discussed with: CRNA  Anesthesia Plan  Comments:        Anesthesia Quick Evaluation

## 2019-09-09 MED ORDER — BUPIVACAINE LIPOSOME 1.3 % IJ SUSP
20.0000 mL | INTRAMUSCULAR | Status: DC
  Filled 2019-09-09: qty 20

## 2019-09-09 MED ORDER — TRANEXAMIC ACID 1000 MG/10ML IV SOLN
2000.0000 mg | INTRAVENOUS | Status: DC
  Filled 2019-09-09: qty 20

## 2019-09-10 ENCOUNTER — Observation Stay (HOSPITAL_COMMUNITY)
Admission: RE | Admit: 2019-09-10 | Discharge: 2019-09-10 | Disposition: A | Discharge status: Home / Self Care | Payer: Medicare HMO | Attending: Orthopedic Surgery | Admitting: Orthopedic Surgery

## 2019-09-10 ENCOUNTER — Other Ambulatory Visit: Payer: Self-pay

## 2019-09-10 ENCOUNTER — Encounter (HOSPITAL_COMMUNITY): Payer: Self-pay | Admitting: Orthopedic Surgery

## 2019-09-10 ENCOUNTER — Encounter (HOSPITAL_COMMUNITY)
Admission: RE | Disposition: A | Discharge status: Home / Self Care | Payer: Self-pay | Source: Home / Self Care | Attending: Orthopedic Surgery

## 2019-09-10 ENCOUNTER — Ambulatory Visit (HOSPITAL_COMMUNITY): Payer: Medicare HMO | Admitting: Registered Nurse

## 2019-09-10 DIAGNOSIS — F329 Major depressive disorder, single episode, unspecified: Secondary | ICD-10-CM | POA: Insufficient documentation

## 2019-09-10 DIAGNOSIS — Z79899 Other long term (current) drug therapy: Secondary | ICD-10-CM | POA: Insufficient documentation

## 2019-09-10 DIAGNOSIS — T84033A Mechanical loosening of internal left knee prosthetic joint, initial encounter: Secondary | ICD-10-CM | POA: Diagnosis not present

## 2019-09-10 DIAGNOSIS — G8918 Other acute postprocedural pain: Secondary | ICD-10-CM | POA: Diagnosis not present

## 2019-09-10 DIAGNOSIS — Z96659 Presence of unspecified artificial knee joint: Secondary | ICD-10-CM

## 2019-09-10 DIAGNOSIS — T8484XA Pain due to internal orthopedic prosthetic devices, implants and grafts, initial encounter: Secondary | ICD-10-CM

## 2019-09-10 DIAGNOSIS — Z96652 Presence of left artificial knee joint: Secondary | ICD-10-CM | POA: Insufficient documentation

## 2019-09-10 DIAGNOSIS — T85848A Pain due to other internal prosthetic devices, implants and grafts, initial encounter: Secondary | ICD-10-CM | POA: Diagnosis not present

## 2019-09-10 DIAGNOSIS — F419 Anxiety disorder, unspecified: Secondary | ICD-10-CM | POA: Diagnosis not present

## 2019-09-10 DIAGNOSIS — K219 Gastro-esophageal reflux disease without esophagitis: Secondary | ICD-10-CM | POA: Diagnosis not present

## 2019-09-10 DIAGNOSIS — Y838 Other surgical procedures as the cause of abnormal reaction of the patient, or of later complication, without mention of misadventure at the time of the procedure: Secondary | ICD-10-CM | POA: Diagnosis not present

## 2019-09-10 DIAGNOSIS — M25462 Effusion, left knee: Secondary | ICD-10-CM | POA: Insufficient documentation

## 2019-09-10 DIAGNOSIS — I10 Essential (primary) hypertension: Secondary | ICD-10-CM | POA: Insufficient documentation

## 2019-09-10 DIAGNOSIS — Z79890 Hormone replacement therapy (postmenopausal): Secondary | ICD-10-CM | POA: Diagnosis not present

## 2019-09-10 DIAGNOSIS — K5090 Crohn's disease, unspecified, without complications: Secondary | ICD-10-CM | POA: Diagnosis not present

## 2019-09-10 DIAGNOSIS — Z885 Allergy status to narcotic agent status: Secondary | ICD-10-CM | POA: Insufficient documentation

## 2019-09-10 DIAGNOSIS — M659 Synovitis and tenosynovitis, unspecified: Secondary | ICD-10-CM | POA: Diagnosis not present

## 2019-09-10 DIAGNOSIS — M1712 Unilateral primary osteoarthritis, left knee: Secondary | ICD-10-CM | POA: Diagnosis not present

## 2019-09-10 DIAGNOSIS — M238X2 Other internal derangements of left knee: Secondary | ICD-10-CM | POA: Insufficient documentation

## 2019-09-10 DIAGNOSIS — F17210 Nicotine dependence, cigarettes, uncomplicated: Secondary | ICD-10-CM | POA: Diagnosis not present

## 2019-09-10 DIAGNOSIS — E039 Hypothyroidism, unspecified: Secondary | ICD-10-CM | POA: Insufficient documentation

## 2019-09-10 DIAGNOSIS — Y793 Surgical instruments, materials and orthopedic devices (including sutures) associated with adverse incidents: Secondary | ICD-10-CM | POA: Insufficient documentation

## 2019-09-10 HISTORY — PX: TOTAL KNEE REVISION: SHX996

## 2019-09-10 HISTORY — DX: Presence of left artificial knee joint: Z96.652

## 2019-09-10 SURGERY — TOTAL KNEE REVISION
Anesthesia: Spinal | Site: Knee | Laterality: Left

## 2019-09-10 MED ORDER — ADULT MULTIVITAMIN W/MINERALS CH: 1.0000 | ORAL_TABLET | Freq: Every day | ORAL | Status: DC

## 2019-09-10 MED ORDER — OXYCODONE HCL 5 MG PO TABS
ORAL_TABLET | ORAL | Status: AC
  Administered 2019-09-10: 5 mg
  Filled 2019-09-10: qty 1

## 2019-09-10 MED ORDER — DIPHENHYDRAMINE HCL 12.5 MG/5ML PO ELIX: 12.5000 mg | ORAL_SOLUTION | ORAL | Status: DC | PRN

## 2019-09-10 MED ORDER — OXYCODONE-ACETAMINOPHEN 5-325 MG PO TABS: 1.0000 | ORAL_TABLET | ORAL | 0 refills | Status: DC | PRN

## 2019-09-10 MED ORDER — HYDROMORPHONE HCL 1 MG/ML IJ SOLN: 0.5000 mg | INTRAMUSCULAR | Status: DC | PRN

## 2019-09-10 MED ORDER — BUSPIRONE HCL 10 MG PO TABS: 30.0000 mg | ORAL_TABLET | Freq: Every day | ORAL | Status: DC

## 2019-09-10 MED ORDER — METOCLOPRAMIDE HCL 5 MG/ML IJ SOLN: 5.0000 mg | Freq: Three times a day (TID) | INTRAMUSCULAR | Status: DC | PRN

## 2019-09-10 MED ORDER — ALUM & MAG HYDROXIDE-SIMETH 200-200-20 MG/5ML PO SUSP: 30.0000 mL | ORAL | Status: DC | PRN

## 2019-09-10 MED ORDER — PROPOFOL 500 MG/50ML IV EMUL
INTRAVENOUS | Status: DC | PRN
  Administered 2019-09-10: 75 ug/kg/min via INTRAVENOUS

## 2019-09-10 MED ORDER — METHOCARBAMOL 500 MG PO TABS
500.0000 mg | ORAL_TABLET | Freq: Four times a day (QID) | ORAL | Status: DC | PRN
  Administered 2019-09-10: 500 mg via ORAL
  Filled 2019-09-10: qty 1

## 2019-09-10 MED ORDER — PROPOFOL 10 MG/ML IV BOLUS
INTRAVENOUS | Status: DC | PRN
  Administered 2019-09-10: 20 mg via INTRAVENOUS

## 2019-09-10 MED ORDER — PHENYLEPHRINE 40 MCG/ML (10ML) SYRINGE FOR IV PUSH (FOR BLOOD PRESSURE SUPPORT)
PREFILLED_SYRINGE | INTRAVENOUS | Status: AC
  Filled 2019-09-10: qty 10

## 2019-09-10 MED ORDER — DEXAMETHASONE SODIUM PHOSPHATE 10 MG/ML IJ SOLN
INTRAMUSCULAR | Status: DC | PRN
  Administered 2019-09-10: 10 mg via INTRAVENOUS

## 2019-09-10 MED ORDER — LACTATED RINGERS IV SOLN: INTRAVENOUS | Status: DC

## 2019-09-10 MED ORDER — FENTANYL CITRATE (PF) 100 MCG/2ML IJ SOLN
50.0000 ug | INTRAMUSCULAR | Status: DC
  Administered 2019-09-10: 50 ug via INTRAVENOUS
  Filled 2019-09-10: qty 2

## 2019-09-10 MED ORDER — PSYLLIUM 95 % PO PACK
1.0000 | PACK | Freq: Two times a day (BID) | ORAL | Status: DC
  Filled 2019-09-10: qty 1

## 2019-09-10 MED ORDER — FENTANYL CITRATE (PF) 100 MCG/2ML IJ SOLN
INTRAMUSCULAR | Status: AC
  Filled 2019-09-10: qty 2

## 2019-09-10 MED ORDER — CITALOPRAM HYDROBROMIDE 20 MG PO TABS: 20.0000 mg | ORAL_TABLET | Freq: Every day | ORAL | Status: DC

## 2019-09-10 MED ORDER — BUPIVACAINE LIPOSOME 1.3 % IJ SUSP
INTRAMUSCULAR | Status: DC | PRN
  Administered 2019-09-10: 20 mL

## 2019-09-10 MED ORDER — TRANEXAMIC ACID-NACL 1000-0.7 MG/100ML-% IV SOLN
1000.0000 mg | INTRAVENOUS | Status: AC
  Administered 2019-09-10: 1000 mg via INTRAVENOUS
  Filled 2019-09-10: qty 100

## 2019-09-10 MED ORDER — SODIUM CHLORIDE (PF) 0.9 % IJ SOLN
INTRAMUSCULAR | Status: DC | PRN
  Administered 2019-09-10: 50 mL via INTRAVENOUS

## 2019-09-10 MED ORDER — PROMETHAZINE HCL 25 MG/ML IJ SOLN: 6.2500 mg | INTRAMUSCULAR | Status: DC | PRN

## 2019-09-10 MED ORDER — ASPIRIN 81 MG PO CHEW: 81.0000 mg | CHEWABLE_TABLET | Freq: Two times a day (BID) | ORAL | Status: DC

## 2019-09-10 MED ORDER — METOPROLOL SUCCINATE ER 25 MG PO TB24: 25.0000 mg | ORAL_TABLET | Freq: Every day | ORAL | Status: DC

## 2019-09-10 MED ORDER — LEVOTHYROXINE SODIUM 100 MCG PO TABS: 100.0000 ug | ORAL_TABLET | Freq: Every day | ORAL | Status: DC

## 2019-09-10 MED ORDER — PROPOFOL 1000 MG/100ML IV EMUL
INTRAVENOUS | Status: AC
  Filled 2019-09-10: qty 200

## 2019-09-10 MED ORDER — DEXAMETHASONE SODIUM PHOSPHATE 10 MG/ML IJ SOLN
INTRAMUSCULAR | Status: AC
  Filled 2019-09-10: qty 1

## 2019-09-10 MED ORDER — TRANEXAMIC ACID 1000 MG/10ML IV SOLN: 2000.0000 mg | Freq: Once | INTRAVENOUS | Status: DC

## 2019-09-10 MED ORDER — DEXAMETHASONE SODIUM PHOSPHATE 10 MG/ML IJ SOLN: 10.0000 mg | Freq: Once | INTRAMUSCULAR | Status: DC

## 2019-09-10 MED ORDER — METOCLOPRAMIDE HCL 5 MG PO TABS: 5.0000 mg | ORAL_TABLET | Freq: Three times a day (TID) | ORAL | Status: DC | PRN

## 2019-09-10 MED ORDER — PROPOFOL 10 MG/ML IV BOLUS
INTRAVENOUS | Status: AC
  Filled 2019-09-10: qty 20

## 2019-09-10 MED ORDER — LIDOCAINE 2% (20 MG/ML) 5 ML SYRINGE
INTRAMUSCULAR | Status: AC
  Filled 2019-09-10: qty 5

## 2019-09-10 MED ORDER — ACETAMINOPHEN 500 MG PO TABS
1000.0000 mg | ORAL_TABLET | Freq: Once | ORAL | Status: AC
  Administered 2019-09-10: 1000 mg via ORAL
  Filled 2019-09-10: qty 2

## 2019-09-10 MED ORDER — PANTOPRAZOLE SODIUM 40 MG PO TBEC
40.0000 mg | DELAYED_RELEASE_TABLET | Freq: Every day | ORAL | Status: DC
  Administered 2019-09-10: 40 mg via ORAL
  Filled 2019-09-10: qty 1

## 2019-09-10 MED ORDER — DOXEPIN HCL 10 MG PO CAPS
10.0000 mg | ORAL_CAPSULE | Freq: Every evening | ORAL | Status: DC
  Administered 2019-09-10: 10 mg via ORAL
  Filled 2019-09-10: qty 1

## 2019-09-10 MED ORDER — MIDAZOLAM HCL 2 MG/2ML IJ SOLN
INTRAMUSCULAR | Status: AC
  Filled 2019-09-10: qty 2

## 2019-09-10 MED ORDER — MIDAZOLAM HCL 2 MG/2ML IJ SOLN
1.0000 mg | INTRAMUSCULAR | Status: DC
  Administered 2019-09-10: 1 mg via INTRAVENOUS
  Filled 2019-09-10: qty 2

## 2019-09-10 MED ORDER — OXYCODONE HCL 5 MG/5ML PO SOLN: 5.0000 mg | Freq: Once | ORAL | Status: AC | PRN

## 2019-09-10 MED ORDER — BUPIVACAINE IN DEXTROSE 0.75-8.25 % IT SOLN
INTRATHECAL | Status: DC | PRN
  Administered 2019-09-10: 1.6 mL via INTRATHECAL

## 2019-09-10 MED ORDER — METHOCARBAMOL 500 MG IVPB - SIMPLE MED
500.0000 mg | Freq: Four times a day (QID) | INTRAVENOUS | Status: DC | PRN
  Administered 2019-09-10: 500 mg via INTRAVENOUS
  Filled 2019-09-10: qty 50

## 2019-09-10 MED ORDER — LIDOCAINE 2% (20 MG/ML) 5 ML SYRINGE
INTRAMUSCULAR | Status: DC | PRN
  Administered 2019-09-10: 40 mg via INTRAVENOUS

## 2019-09-10 MED ORDER — ONDANSETRON HCL 4 MG/2ML IJ SOLN
INTRAMUSCULAR | Status: AC
  Filled 2019-09-10: qty 2

## 2019-09-10 MED ORDER — POVIDONE-IODINE 10 % EX SWAB
2.0000 "application " | Freq: Once | CUTANEOUS | Status: AC
  Administered 2019-09-10: 2 via TOPICAL

## 2019-09-10 MED ORDER — BUPIVACAINE-EPINEPHRINE (PF) 0.5% -1:200000 IJ SOLN
INTRAMUSCULAR | Status: DC | PRN
  Administered 2019-09-10: 15 mL via PERINEURAL

## 2019-09-10 MED ORDER — EPHEDRINE 5 MG/ML INJ
INTRAVENOUS | Status: AC
  Filled 2019-09-10: qty 10

## 2019-09-10 MED ORDER — GABAPENTIN 100 MG PO CAPS
100.0000 mg | ORAL_CAPSULE | Freq: Three times a day (TID) | ORAL | Status: DC
  Administered 2019-09-10: 100 mg via ORAL
  Filled 2019-09-10: qty 1

## 2019-09-10 MED ORDER — ONDANSETRON HCL 4 MG/2ML IJ SOLN: 4.0000 mg | Freq: Four times a day (QID) | INTRAMUSCULAR | Status: DC | PRN

## 2019-09-10 MED ORDER — MENTHOL 3 MG MT LOZG: 1.0000 | LOZENGE | OROMUCOSAL | Status: DC | PRN

## 2019-09-10 MED ORDER — DOCUSATE SODIUM 100 MG PO CAPS: 100.0000 mg | ORAL_CAPSULE | Freq: Two times a day (BID) | ORAL | Status: DC

## 2019-09-10 MED ORDER — OXYCODONE HCL 5 MG PO TABS
5.0000 mg | ORAL_TABLET | ORAL | Status: DC | PRN
  Administered 2019-09-10: 10 mg via ORAL
  Filled 2019-09-10: qty 1
  Filled 2019-09-10: qty 2

## 2019-09-10 MED ORDER — SODIUM CHLORIDE 0.9 % IR SOLN
Status: DC | PRN
  Administered 2019-09-10: 1000 mL

## 2019-09-10 MED ORDER — FLEET ENEMA 7-19 GM/118ML RE ENEM: 1.0000 | ENEMA | Freq: Once | RECTAL | Status: DC | PRN

## 2019-09-10 MED ORDER — TRANEXAMIC ACID 1000 MG/10ML IV SOLN
INTRAVENOUS | Status: DC | PRN
  Administered 2019-09-10: 2000 mg via TOPICAL

## 2019-09-10 MED ORDER — ASPIRIN EC 81 MG PO TBEC: 81.0000 mg | DELAYED_RELEASE_TABLET | Freq: Two times a day (BID) | ORAL | 0 refills | Status: DC

## 2019-09-10 MED ORDER — BISACODYL 5 MG PO TBEC: 5.0000 mg | DELAYED_RELEASE_TABLET | Freq: Every day | ORAL | Status: DC | PRN

## 2019-09-10 MED ORDER — MIDAZOLAM HCL 5 MG/5ML IJ SOLN
INTRAMUSCULAR | Status: DC | PRN
  Administered 2019-09-10: 2 mg via INTRAVENOUS

## 2019-09-10 MED ORDER — FENTANYL CITRATE (PF) 100 MCG/2ML IJ SOLN
25.0000 ug | INTRAMUSCULAR | Status: DC | PRN
  Administered 2019-09-10 (×3): 50 ug via INTRAVENOUS

## 2019-09-10 MED ORDER — FENTANYL CITRATE (PF) 100 MCG/2ML IJ SOLN
INTRAMUSCULAR | Status: DC | PRN
  Administered 2019-09-10: 50 ug via INTRAVENOUS

## 2019-09-10 MED ORDER — SODIUM CHLORIDE (PF) 0.9 % IJ SOLN
INTRAMUSCULAR | Status: AC
  Filled 2019-09-10: qty 50

## 2019-09-10 MED ORDER — TRANEXAMIC ACID-NACL 1000-0.7 MG/100ML-% IV SOLN
1000.0000 mg | Freq: Once | INTRAVENOUS | Status: AC
  Administered 2019-09-10: 1000 mg via INTRAVENOUS
  Filled 2019-09-10: qty 100

## 2019-09-10 MED ORDER — ACETAMINOPHEN 325 MG PO TABS: 325.0000 mg | ORAL_TABLET | Freq: Four times a day (QID) | ORAL | Status: DC | PRN

## 2019-09-10 MED ORDER — TIZANIDINE HCL 2 MG PO TABS: 2.0000 mg | ORAL_TABLET | Freq: Four times a day (QID) | ORAL | 0 refills | Status: DC | PRN

## 2019-09-10 MED ORDER — ATORVASTATIN CALCIUM 40 MG PO TABS: 40.0000 mg | ORAL_TABLET | Freq: Every day | ORAL | Status: DC

## 2019-09-10 MED ORDER — CHLORHEXIDINE GLUCONATE 4 % EX LIQD: 60.0000 mL | Freq: Once | CUTANEOUS | Status: DC

## 2019-09-10 MED ORDER — PHENOL 1.4 % MT LIQD: 1.0000 | OROMUCOSAL | Status: DC | PRN

## 2019-09-10 MED ORDER — FIBER PO POWD: Freq: Two times a day (BID) | ORAL | Status: DC

## 2019-09-10 MED ORDER — CEFAZOLIN SODIUM-DEXTROSE 2-4 GM/100ML-% IV SOLN
2.0000 g | INTRAVENOUS | Status: AC
  Administered 2019-09-10: 2 g via INTRAVENOUS
  Filled 2019-09-10: qty 100

## 2019-09-10 MED ORDER — ONDANSETRON HCL 4 MG PO TABS: 4.0000 mg | ORAL_TABLET | Freq: Four times a day (QID) | ORAL | Status: DC | PRN

## 2019-09-10 MED ORDER — DULOXETINE HCL 30 MG PO CPEP
30.0000 mg | ORAL_CAPSULE | Freq: Every day | ORAL | Status: DC
  Administered 2019-09-10: 30 mg via ORAL
  Filled 2019-09-10: qty 1

## 2019-09-10 MED ORDER — LINACLOTIDE 72 MCG PO CAPS: 72.0000 ug | ORAL_CAPSULE | Freq: Every day | ORAL | Status: DC

## 2019-09-10 MED ORDER — EPHEDRINE SULFATE-NACL 50-0.9 MG/10ML-% IV SOSY
PREFILLED_SYRINGE | INTRAVENOUS | Status: DC | PRN
  Administered 2019-09-10 (×2): 15 mg via INTRAVENOUS
  Administered 2019-09-10 (×2): 10 mg via INTRAVENOUS

## 2019-09-10 MED ORDER — STERILE WATER FOR IRRIGATION IR SOLN
Status: DC | PRN
  Administered 2019-09-10: 2000 mL

## 2019-09-10 MED ORDER — POLYETHYLENE GLYCOL 3350 17 G PO PACK: 17.0000 g | PACK | Freq: Every day | ORAL | Status: DC | PRN

## 2019-09-10 MED ORDER — ONDANSETRON HCL 4 MG/2ML IJ SOLN
INTRAMUSCULAR | Status: DC | PRN
  Administered 2019-09-10: 4 mg via INTRAVENOUS

## 2019-09-10 MED ORDER — OXYCODONE HCL 5 MG PO TABS
5.0000 mg | ORAL_TABLET | Freq: Once | ORAL | Status: AC | PRN
  Administered 2019-09-10: 5 mg via ORAL

## 2019-09-10 MED ORDER — BUPIVACAINE HCL (PF) 0.25 % IJ SOLN
INTRAMUSCULAR | Status: AC
  Filled 2019-09-10: qty 30

## 2019-09-10 MED ORDER — METHOCARBAMOL 500 MG IVPB - SIMPLE MED
INTRAVENOUS | Status: AC
  Filled 2019-09-10: qty 50

## 2019-09-10 MED ORDER — PHENYLEPHRINE 40 MCG/ML (10ML) SYRINGE FOR IV PUSH (FOR BLOOD PRESSURE SUPPORT)
PREFILLED_SYRINGE | INTRAVENOUS | Status: DC | PRN
  Administered 2019-09-10 (×5): 80 ug via INTRAVENOUS

## 2019-09-10 MED ORDER — BUPIVACAINE HCL 0.25 % IJ SOLN
INTRAMUSCULAR | Status: DC | PRN
  Administered 2019-09-10: 30 mL

## 2019-09-10 MED ORDER — KCL IN DEXTROSE-NACL 20-5-0.45 MEQ/L-%-% IV SOLN
INTRAVENOUS | Status: DC
  Filled 2019-09-10: qty 1000

## 2019-09-10 SURGICAL SUPPLY — 64 items
ATTUNE MED DOME PAT 38 KNEE (Knees) ×1 IMPLANT
ATTUNE PS FEM LT SZ 5 CEM KNEE (Femur) ×1 IMPLANT
ATTUNE PSRP INSE SZ5 7 KNEE (Insert) ×1 IMPLANT
BAG DECANTER FOR FLEXI CONT (MISCELLANEOUS) ×2 IMPLANT
BAG ZIPLOCK 12X15 (MISCELLANEOUS) ×2 IMPLANT
BASE TIBIA ATTUNE KNEE SYS SZ6 (Knees) IMPLANT
BLADE OSCILLATING/SAGITTAL (BLADE) ×1
BLADE SAG 18X100X1.27 (BLADE) ×1 IMPLANT
BLADE SAW SAG 90X13X1.27 (BLADE) ×2 IMPLANT
BLADE SAW SGTL 11.0X1.19X90.0M (BLADE) ×1 IMPLANT
BLADE SAW SGTL 81X20 HD (BLADE) ×1 IMPLANT
BLADE SURG SZ10 CARB STEEL (BLADE) ×4 IMPLANT
BLADE SW THK.38XMED LNG THN (BLADE) ×2 IMPLANT
BNDG ELASTIC 6X10 VLCR STRL LF (GAUZE/BANDAGES/DRESSINGS) ×4 IMPLANT
BOWL SMART MIX CTS (DISPOSABLE) ×2 IMPLANT
BRUSH FEMORAL CANAL (MISCELLANEOUS) ×2 IMPLANT
BUR OVAL CARBIDE 4.0 (BURR) IMPLANT
CEMENT HV SMART SET (Cement) ×4 IMPLANT
COVER SURGICAL LIGHT HANDLE (MISCELLANEOUS) ×2 IMPLANT
COVER WAND RF STERILE (DRAPES) IMPLANT
CUFF TOURN SGL QUICK 34 (TOURNIQUET CUFF) ×1
CUFF TRNQT CYL 34X4.125X (TOURNIQUET CUFF) ×1 IMPLANT
DECANTER SPIKE VIAL GLASS SM (MISCELLANEOUS) ×6 IMPLANT
DRAPE ORTHO SPLIT 77X108 STRL (DRAPES) ×2
DRAPE SURG ORHT 6 SPLT 77X108 (DRAPES) ×2 IMPLANT
DRAPE U-SHAPE 47X51 STRL (DRAPES) ×2 IMPLANT
DRESSING AQUACEL AG SP 3.5X10 (GAUZE/BANDAGES/DRESSINGS) IMPLANT
DRSG AQUACEL AG ADV 3.5X10 (GAUZE/BANDAGES/DRESSINGS) ×1 IMPLANT
DRSG AQUACEL AG ADV 3.5X14 (GAUZE/BANDAGES/DRESSINGS) IMPLANT
DRSG AQUACEL AG SP 3.5X10 (GAUZE/BANDAGES/DRESSINGS)
DURAPREP 26ML APPLICATOR (WOUND CARE) ×2 IMPLANT
ELECT REM PT RETURN 15FT ADLT (MISCELLANEOUS) ×2 IMPLANT
GLOVE BIO SURGEON STRL SZ7.5 (GLOVE) ×2 IMPLANT
GLOVE BIO SURGEON STRL SZ8.5 (GLOVE) ×2 IMPLANT
GLOVE BIOGEL PI IND STRL 8 (GLOVE) ×1 IMPLANT
GLOVE BIOGEL PI IND STRL 9 (GLOVE) ×1 IMPLANT
GLOVE BIOGEL PI INDICATOR 8 (GLOVE) ×1
GLOVE BIOGEL PI INDICATOR 9 (GLOVE) ×1
GOWN STRL REUS W/TWL XL LVL3 (GOWN DISPOSABLE) ×4 IMPLANT
HANDPIECE INTERPULSE COAX TIP (DISPOSABLE) ×1
HOOD PEEL AWAY FLYTE STAYCOOL (MISCELLANEOUS) ×6 IMPLANT
KIT TURNOVER KIT A (KITS) IMPLANT
NDL HYPO 21X1.5 SAFETY (NEEDLE) ×2 IMPLANT
NEEDLE HYPO 21X1.5 SAFETY (NEEDLE) ×4 IMPLANT
NS IRRIG 1000ML POUR BTL (IV SOLUTION) ×2 IMPLANT
PACK TOTAL KNEE CUSTOM (KITS) ×2 IMPLANT
PENCIL SMOKE EVACUATOR (MISCELLANEOUS) IMPLANT
PIN DRILL FIX HALF THREAD (BIT) ×1 IMPLANT
PIN STEINMAN FIXATION KNEE (PIN) ×1 IMPLANT
PROTECTOR NERVE ULNAR (MISCELLANEOUS) ×2 IMPLANT
SET HNDPC FAN SPRY TIP SCT (DISPOSABLE) ×1 IMPLANT
STAPLER VISISTAT 35W (STAPLE) IMPLANT
SUT VIC AB 1 CTX 36 (SUTURE) ×1
SUT VIC AB 1 CTX36XBRD ANBCTR (SUTURE) ×1 IMPLANT
SUT VIC AB 3-0 CT1 27 (SUTURE) ×1
SUT VIC AB 3-0 CT1 TAPERPNT 27 (SUTURE) ×1 IMPLANT
SWAB COLLECTION DEVICE MRSA (MISCELLANEOUS) IMPLANT
SWAB CULTURE ESWAB REG 1ML (MISCELLANEOUS) IMPLANT
SYR CONTROL 10ML LL (SYRINGE) ×4 IMPLANT
TIBIA ATTUNE KNEE SYS BASE SZ6 (Knees) ×2 IMPLANT
TRAY FOLEY MTR SLVR 16FR STAT (SET/KITS/TRAYS/PACK) ×2 IMPLANT
WATER STERILE IRR 1000ML POUR (IV SOLUTION) ×4 IMPLANT
WRAP KNEE MAXI GEL POST OP (GAUZE/BANDAGES/DRESSINGS) ×2 IMPLANT
YANKAUER SUCT BULB TIP 10FT TU (MISCELLANEOUS) ×2 IMPLANT

## 2019-09-10 NOTE — Progress Notes (Signed)
Orthopedic Tech Progress Note Patient Details:  Victoria Deleon [birth year] [id number] Bone foam left on bed Ortho Devices Type of Ortho Device: Bone foam zero knee Ortho Device/Splint Interventions: Application   Post Interventions Patient Tolerated: Well Instructions Provided: Care of device, Adjustment of device   Victoria Deleon 09/10/2019, 12:34 PM

## 2019-09-10 NOTE — Anesthesia Procedure Notes (Signed)
Date/Time: 09/10/2019 9:35 AM Performed by: Talbot Grumbling, CRNA Oxygen Delivery Method: Simple face mask

## 2019-09-10 NOTE — Progress Notes (Signed)
Per [facility] OR RN Dr Mayer Camel states pt will go upstairs with potential to go home later today.

## 2019-09-10 NOTE — Progress Notes (Signed)
PA aware of patient has achieved all her goals but her oxygen dips occasionally to 88, 89% but very quickly recovers back to 90's. Patient is being discharged although aware her pharmacy is closed and unable to get pain medication until 4/13. I paged PA but did not hear a response back.

## 2019-09-10 NOTE — Progress Notes (Signed)
Assisted Dr. Birdie Sons with left, ultrasound guided, adductor canal block. Side rails up, monitors on throughout procedure. See vital signs in flow sheet. Tolerated Procedure well.

## 2019-09-10 NOTE — Transfer of Care (Signed)
Immediate Anesthesia Transfer of Care Note  Patient: Victoria Deleon  Procedure(s) Performed: LEFT TOTAL KNEE REVISION (Left Knee)  Patient Location: PACU  Anesthesia Type:Spinal  Level of Consciousness: awake, alert  and oriented  Airway & Oxygen Therapy: Patient Spontanous Breathing and Patient connected to face mask oxygen  Post-op Assessment: Report given to RN and Post -op Vital signs reviewed and stable  Post vital signs: Reviewed and stable  Last Vitals:  Vitals Value Taken Time  BP 97/56 09/10/19 1158  Temp    Pulse 61 09/10/19 1159  Resp 14 09/10/19 1159  SpO2 94 % 09/10/19 1159  Vitals shown include unvalidated device data.  Last Pain:  Vitals:   09/10/19 0656  TempSrc:   PainSc: 5       Patients Stated Pain Goal: 3 (18/29/93 7169)  Complications: No apparent anesthesia complications

## 2019-09-10 NOTE — TOC Initial Note (Signed)
Transition of Care [facility]) - Initial/Assessment Note    Patient Details  Name: Victoria Deleon MRN: [id number] Date of Birth: Apr 21, 1966  [facility]) CM/SW Contact:    Victoria Pall, LCSW Phone Number: 09/10/2019, 4:12 PM  Clinical Narrative:  Met with pt and spouse who are aware that d/c could possibly happen today or tomorrow.  Awaiting PT eval.  Pt already established with [facility] for HHPT but still needs her rolling walker from [employer].  IF she is cleared for d/c today, then rw will need to be secured from PACU supply.                 Expected Discharge Plan: [place] Barriers to Discharge: Continued Medical Work up   Patient Goals and CMS Choice Patient states their goals for this hospitalization and ongoing recovery are:: to go home      Expected Discharge Plan and Services Expected Discharge Plan: [place] In-house Referral: Clinical Social Work   Post Acute Care Choice: Durable Medical Equipment, Home Health Living arrangements for the past 2 months: [place] Expected Discharge Date: 09/10/19               DME Arranged: [place] Rile rolling DME Agency: [employer] Date DME Agency Contacted: 09/10/19 Time DME Agency Contacted: [phone] Representative spoke with at DME Agency: Victoria Deleon: PT [place] Agency: [facility] ([facility]) Date [place]: 09/10/19 Time [place]: 65 Representative spoke with at [place]: Victoria Deleon  Prior Living Arrangements/Services Living arrangements for the past 2 months: [place] with:: Spouse Patient language and need for interpreter reviewed:: Yes Do you feel safe going back to the place where you live?: Yes      Need for Family Participation in Patient Care: Yes (Comment) Care giver support system in place?: Yes (comment)   Criminal Activity/Legal Involvement Pertinent to Current Situation/Hospitalization: No - Comment as  needed  Activities of Daily Living Home Assistive Devices/Equipment: Eyeglasses ADL Screening (condition at time of admission) Patient's cognitive ability adequate to safely complete daily activities?: Yes Is the patient deaf or have difficulty hearing?: No Does the patient have difficulty seeing, even when wearing glasses/contacts?: No Does the patient have difficulty concentrating, remembering, or making decisions?: No Patient able to express need for assistance with ADLs?: No Does the patient have difficulty dressing or bathing?: No Independently performs ADLs?: Yes (appropriate for developmental age) Does the patient have difficulty walking or climbing stairs?: Yes Weakness of Legs: Both Weakness of Arms/Hands: Both  Permission Sought/Granted Permission sought to share information with : Family Supports Permission granted to share information with : Yes, Verbal Permission Granted  Share Information with NAME: Victoria Deleon     Permission granted to share info w Relationship: spouse  Permission granted to share info w Contact Information: [phone]  Emotional Assessment Appearance:: Appears older than stated age Attitude/Demeanor/Rapport: Engaged, Gracious Affect (typically observed): Stable, Pleasant Orientation: : Oriented to Self, Oriented to Place, Oriented to Situation, Oriented to  Time Alcohol / Substance Use: Not Applicable Psych Involvement: No (comment)  Admission diagnosis:  Status post revision of total replacement of left knee [Z96.652] Patient Active Problem List   Diagnosis Date Noted  . 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 Pain due to unicompartmental arthroplasty of knee ([place]) 09/06/2019  . GERD (gastroesophageal reflux disease) 07/27/2018  . Constipation 07/27/2018  . Nonspecific abnormal electrocardiogram (ECG) (EKG)  07/27/2018  . Crohn's disease ([place]) 02/13/2018   PCP:  Victoria Burly, MD Pharmacy:   [facility] [address] [place] [address] [id number] [place] [address] [place] [address] Phone: [phone] Fax: [phone]     Social Determinants of Health (SDOH) Interventions    Readmission Risk Interventions No flowsheet data found.

## 2019-09-10 NOTE — Anesthesia Postprocedure Evaluation (Signed)
Anesthesia Post Note  Patient: Victoria Deleon  Procedure(s) Performed: LEFT TOTAL KNEE REVISION (Left Knee)     Patient location during evaluation: PACU Anesthesia Type: Spinal Level of consciousness: awake and alert and oriented Pain management: pain level controlled Vital Signs Assessment: post-procedure vital signs reviewed and stable Respiratory status: spontaneous breathing, nonlabored ventilation and respiratory function stable Cardiovascular status: blood pressure returned to baseline Postop Assessment: no apparent nausea or vomiting, spinal receding, no headache and no backache Anesthetic complications: no    Last Vitals:  Vitals:   09/10/19 1245 09/10/19 1300  BP: 117/66 118/61  Pulse: 64 64  Resp: 10 14  Temp:  (!) 36.3 C  SpO2: 97% 98%    Last Pain:  Vitals:   09/10/19 1300  TempSrc:   PainSc: [place]

## 2019-09-10 NOTE — Anesthesia Procedure Notes (Signed)
Anesthesia Regional Block: Adductor canal block   Pre-Anesthetic Checklist: ,, timeout performed, Correct Patient, Correct Site, Correct Laterality, Correct Procedure, Correct Position, site marked, Risks and benefits discussed, pre-op evaluation,  At surgeon's request and post-op pain management  Laterality: Left  Prep: Maximum Sterile Barrier Precautions used, chloraprep       Needles:  Injection technique: Single-shot  Needle Type: Echogenic Stimulator Needle     Needle Length: 9cm  Needle Gauge: 22     Additional Needles:   Procedures:,,,, ultrasound used (permanent image in chart),,,,  Narrative:  Start time: 09/10/2019 8:37 AM End time: 09/10/2019 8:40 AM Injection made incrementally with aspirations every 5 mL.  Performed by: Personally  Anesthesiologist: Brennan Bailey, MD  Additional Notes: Risks, benefits, and alternative discussed. Patient gave consent for procedure. Patient prepped and draped in sterile fashion. Sedation administered, patient remains easily responsive to voice. Relevant anatomy identified with ultrasound guidance. Local anesthetic given in 5cc increments with no signs or symptoms of intravascular injection. No pain or paraesthesias with injection. Patient monitored throughout procedure with signs of LAST or immediate complications. Tolerated well. Ultrasound image placed in chart.  Tawny Asal, MD

## 2019-09-10 NOTE — Plan of Care (Signed)
  Problem: Education: Goal: Knowledge of General Education information will improve Description: Including pain rating scale, medication(s)/side effects and non-pharmacologic comfort measures 09/10/2019 1925 by Hubert Azure, RN Outcome: Progressing 09/10/2019 1327 by Hubert Azure, RN Outcome: Progressing   Problem: Health Behavior/Discharge Planning: Goal: Ability to manage health-related needs will improve 09/10/2019 1925 by Hubert Azure, RN Outcome: Progressing 09/10/2019 1327 by Hubert Azure, RN Outcome: Progressing   Problem: Clinical Measurements: Goal: Ability to maintain clinical measurements within normal limits will improve 09/10/2019 1925 by Hubert Azure, RN Outcome: Progressing 09/10/2019 1327 by Hubert Azure, RN Outcome: Progressing Goal: Will remain free from infection 09/10/2019 1925 by Hubert Azure, RN Outcome: Progressing 09/10/2019 1327 by Hubert Azure, RN Outcome: Progressing Goal: Diagnostic test results will improve Outcome: Progressing

## 2019-09-10 NOTE — Op Note (Signed)
PATIENT ID:      Victoria Deleon  MRN:     [id number] DOB/AGE:    [birth year] / 54 y.o.       OPERATIVE REPORT   DATE OF PROCEDURE:  09/10/2019      PREOPERATIVE DIAGNOSIS:   PAINFUL LEFT KNEE REPLACEMENT      Estimated body mass index is 30.8 kg/m as calculated from the following:   Height as of 08/30/19: 5' 1"  (1.549 m).   Weight as of 08/30/19: 73.9 kg.                                                       POSTOPERATIVE DIAGNOSIS:   Same                                                                   PROCEDURE:  Revision L TKA with complete removal of femoral and Tibial implants. Revised to 5 left Depuy attune femoral component, 6 tibial component, 7 mm RP bearing, 38 mm patellar button, double batch DePuy HV cement    SURGEON: Kerin Salen  ASSISTANT:   Kerry Hough. [employer]   (Present and scrubbed throughout the case, critical for assistance with exposure, retraction, instrumentation, and closure.)        ANESTHESIA: Spinal, 20cc Exparel, 50cc 0.25% Marcaine EBL: 300 cc FLUID REPLACEMENT: 1600 cc crystaloid TOURNIQUET: DRAINS: None TRANEXAMIC ACID: 1gm IV, 2gm topical COMPLICATIONS:  None         INDICATIONS FOR PROCEDURE: 54 yo female s/p L knee arthroplasty 2007 with increaing pain and probable loosening of tibial implant by XR. She desires elective Revision L TKA.  Risks and benefits of surgery have been discussed, questions answered.   DESCRIPTION OF PROCEDURE: The patient identified by armband, received  IV antibiotics, in the holding area at [facility]. Patient taken to the operating room, appropriate anesthetic monitors were attached, and spinal anesthesia was  induced. IV Tranexamic acid was given.Tourniquet applied high to the operative thigh. Lateral post and foot positioner applied to the table, the lower extremity was then prepped and draped in usual sterile fashion from the toes to the tourniquet. Time-out procedure was performed. The skin and subcutaneous tissue  along the incision was injected with 20 cc of a mixture of Exparel and Marcaine solution, using a 20-gauge by 1-1/2 inch needle. We began the operation, with the knee flexed 130 degrees, by making the anterior midline incision starting at handbreadth above the patella going over the patella 1 cm medial to and 4 cm distal to the tibial tubercle. Small bleeders in the skin and the subcutaneous tissue identified and cauterized. Transverse retinaculum was incised and reflected medially and a medial parapatellar arthrotomy was accomplished.  With the knee flexed 130 degrees we went ahead and elevated the superficial medial collateral ligament off of the proximal tibia exposing the tibial component which did have some microloosening to direct examination.  We continued our dissection were able to evert the patella giving [place] better exposure of the tibial implant which was then removed using the ACL saw and osteotomes.  In a similar fashion we examined the femoral component which was not loose but wound had and removed it so that we would have mating components from a single manufacture for the revision knee.  With the knee hyperflexed and the patella everted we then instrumented the proximal tibia with the intramedullary rod and performed a 7 mm cut off the lateral tibial plateau which was still intact giving [place] a smooth tibial surface.  In a similar fashion we entered the distal femur with the intramedullary rod set at 9 mm cut 5 degrees valgus referenced off the lateral femoral condyle and performed a standard distal femoral cut we then sized for a 5 left Depuy attune femoral implant applied the distal femoral box cutting guide in the correct rotation performed anterior posterior and chamfer cuts without difficulty.  The knee was then placed in full extension and had good stability with a 7 mm lollipop.  We then identified and cauterized small bleeders in the posterior capsule of the knee.  We also instilled Exparel  throughout the medial posterior and lateral capsule.  The knee was once again hyperflexed the tibia sized for a #6 tibial baseplate because of the pre-existing cement on the tibia we went ahead and cut the delta fin keel with a saw and then punched through the 6 trial baseplate.  A 5 left trial femur was then placed.  With the knee flexed 30 degrees and the patella everted we remove the posterior 9 mm of the patella sized for a 38 patellar button and drilled the lollipop.  We then inserted a 7 mm trial bearing took the knee through a full range of motion with a 38 trial patellar button good stability to varus and valgus stress was noted the knee came to full extension and flexed to 130 degrees without difficulty.  Trial components were then removed and all bony surfaces were WaterPik to clean and dried with suction and sponges Exparel was then placed on the cancellus bone and sclerotic bone was drilled with a guidepin for cement interdigitation.  A double batch of DePuy HV cement was then mixed applied to all bony metallic mating surfaces in order we cemented into place a 6 tibial baseplate and removed excess cement followed by a 5 left femoral component and removed excess cement we then inserted the 7 mm bearing took the knee out to full extension inserted the 38 mm patellar button and clamped it.  The knee was placed in 30 degrees of extension to allow the cement to be compressed on the rest the Exparel was injected into the soft tissues.    The wound was irrigated out with normal saline solution pulse lavage. The rest of the Exparel was injected into the parapatellar arthrotomy, subcutaneous tissues, and periosteal tissues. The parapatellar arthrotomy was closed with running #1 Vicryl suture. The subcutaneous tissue with 0 and 2-0 undyed Vicryl suture, and the skin with running 3-0 SQ vicryl. An Aquacil and Ace wrap were applied. The patient was taken to recovery room without difficulty.   Kerin Salen 09/10/2019, 6:01 AM

## 2019-09-10 NOTE — Plan of Care (Signed)
  Problem: Education: Goal: Knowledge of General Education information will improve Description: Including pain rating scale, medication(s)/side effects and non-pharmacologic comfort measures Outcome: Progressing   Problem: Health Behavior/Discharge Planning: Goal: Ability to manage health-related needs will improve Outcome: Progressing   Problem: Clinical Measurements: Goal: Ability to maintain clinical measurements within normal limits will improve Outcome: Progressing Goal: Will remain free from infection Outcome: Progressing Goal: Diagnostic test results will improve Outcome: Progressing   Problem: Education: Goal: Knowledge of General Education information will improve Description: Including pain rating scale, medication(s)/side effects and non-pharmacologic comfort measures Outcome: Progressing   Problem: Health Behavior/Discharge Planning: Goal: Ability to manage health-related needs will improve Outcome: Progressing   Problem: Clinical Measurements: Goal: Ability to maintain clinical measurements within normal limits will improve Outcome: Progressing Goal: Will remain free from infection Outcome: Progressing Goal: Diagnostic test results will improve Outcome: Progressing

## 2019-09-10 NOTE — Evaluation (Signed)
Physical Therapy Evaluation Patient Details Name: Nylani Michetti MRN: [id number] DOB: 1966-01-11 Today's Date: 09/10/2019   History of Present Illness  s/p L TKA revision. PMH: anxiety, PTSD, HTN, Crohn's  Clinical Impression  Pt is s/p TKA revision resulting in the deficits listed below (see PT Problem List).  Pt doing very well today, amb household distance with min/guard for safety. Pt has good home support. Initiated TKA HEP.  All questions/concerns addressed. Pt is ok to d/c from PT standpoint with family support   Pt will benefit from skilled PT to increase their independence and safety with mobility to allow discharge to the venue listed below.      Follow Up Recommendations Follow surgeon's recommendation for DC plan and follow-up therapies;Supervision for mobility/OOB    Equipment Recommendations  Rolling walker with 5" wheels    Recommendations for Other Services       Precautions / Restrictions Precautions Precautions: Fall Restrictions Weight Bearing Restrictions: No Other Position/Activity Restrictions: WBAT      Mobility  Bed Mobility Overal bed mobility: Needs Assistance Bed Mobility: Supine to Sit     Supine to sit: Supervision     General bed mobility comments: for safety  Transfers Overall transfer level: Needs assistance Equipment used: Rolling walker (2 wheeled) Transfers: Sit to/from Stand Sit to Stand: Min guard         General transfer comment: for safety  Ambulation/Gait Ambulation/Gait assistance: Min guard Gait Distance (Feet): 70 Feet(15') Assistive device: Rolling walker (2 wheeled) Gait Pattern/deviations: Step-to pattern;Antalgic     General Gait Details: cues for sequence  Stairs            Wheelchair Mobility    Modified Rankin (Stroke Patients Only)       Balance                                             Pertinent Vitals/Pain Pain Assessment: 0-10 Pain Score: 7  Pain Location: left  knee Pain Descriptors / Indicators: Aching;Grimacing;Sore Pain Intervention(s): Premedicated before session;Monitored during session;Limited activity within patient's tolerance    Home Living Family/patient expects to be discharged to:: Private residence Living Arrangements: Spouse/significant other Available Help at Discharge: Family Type of Home: House Home Access: Level entry     Home Layout: One level Home Equipment: None      Prior Function Level of Independence: Independent               Hand Dominance        Extremity/Trunk Assessment   Upper Extremity Assessment Upper Extremity Assessment: Overall WFL for tasks assessed    Lower Extremity Assessment Lower Extremity Assessment: LLE deficits/detail LLE Deficits / Details: ankle WFL; knee and hip grossly 3/5. AAROM grossly 10 to 75 degrees flexion       Communication   Communication: No difficulties  Cognition Arousal/Alertness: Awake/alert Behavior During Therapy: WFL for tasks assessed/performed Overall Cognitive Status: Within Functional Limits for tasks assessed                                        General Comments      Exercises Total Joint Exercises Ankle Circles/Pumps: AROM;Both;10 reps Quad Sets: 10 reps;Both;AROM Heel Slides: AAROM;10 reps;Left   Assessment/Plan    PT Assessment All further PT needs  can be met in the next venue of care  PT Problem List         PT Treatment Interventions      PT Goals (Current goals can be found in the Care Plan section)  Acute Rehab PT Goals Patient Stated Goal: home today PT Goal Formulation: All assessment and education complete, DC therapy    Frequency     Barriers to discharge        Co-evaluation               AM-PAC PT "6 Clicks" Mobility  Outcome Measure Help needed turning from your back to your side while in a flat bed without using bedrails?: A Little Help needed moving from lying on your back to sitting  on the side of a flat bed without using bedrails?: A Little Help needed moving to and from a bed to a chair (including a wheelchair)?: A Little Help needed standing up from a chair using your arms (e.g., wheelchair or bedside chair)?: A Little Help needed to walk in hospital room?: A Little Help needed climbing 3-5 steps with a railing? : A Little 6 Click Score: 18    End of Session Equipment Utilized During Treatment: Gait belt Activity Tolerance: Patient tolerated treatment well Patient left: in chair;with call bell/phone within reach;with family/visitor present;with chair alarm set   PT Visit Diagnosis: Difficulty in walking, not elsewhere classified (R26.2)    Time: 8099-8338 PT Time Calculation (min) (ACUTE ONLY): 31 min   Charges:   PT Evaluation $PT Eval Low Complexity: 1 Low PT Treatments $Gait Training: 8-22 mins        Baxter Flattery, PT   Acute Rehab Dept [facility]): [phone]   09/10/2019   [facility] 09/10/2019, 5:08 PM

## 2019-09-10 NOTE — Discharge Instructions (Signed)

## 2019-09-10 NOTE — Interval H&P Note (Signed)
History and Physical Interval Note:  09/10/2019 6:55 AM  Victoria Deleon  has presented today for surgery, with the diagnosis of PAINFUL LEFT KNEE REPLACEMENT.  The various methods of treatment have been discussed with the patient and family. After consideration of risks, benefits and other options for treatment, the patient has consented to  Procedure(s): LEFT TOTAL KNEE REVISION (Left) as a surgical intervention.  The patient's history has been reviewed, patient examined, no change in status, stable for surgery.  I have reviewed the patient's chart and labs.  Questions were answered to the patient's satisfaction.     Kerin Salen

## 2019-09-10 NOTE — Anesthesia Procedure Notes (Signed)
Spinal  Patient location during procedure: OR Start time: 09/10/2019 9:37 AM End time: 09/10/2019 9:42 AM Staffing Performed: resident/CRNA  Anesthesiologist: Brennan Bailey, MD Resident/CRNA: Talbot Grumbling, CRNA Preanesthetic Checklist Completed: patient identified, IV checked, risks and benefits discussed, surgical consent, monitors and equipment checked, pre-op evaluation and timeout performed Spinal Block Patient position: sitting Prep: DuraPrep Patient monitoring: heart rate, cardiac monitor, continuous pulse ox and blood pressure Approach: midline Location: L3-4 Injection technique: single-shot Needle Needle type: Pencan  Needle gauge: 24 G Needle length: 9 cm Assessment Sensory level: T4 Additional Notes Clear CSF, no paresthesia, patient tolerated well.

## 2019-09-11 DIAGNOSIS — M1712 Unilateral primary osteoarthritis, left knee: Secondary | ICD-10-CM | POA: Diagnosis not present

## 2019-09-11 DIAGNOSIS — Z96652 Presence of left artificial knee joint: Secondary | ICD-10-CM | POA: Diagnosis not present

## 2019-09-12 ENCOUNTER — Encounter: Payer: Self-pay | Admitting: *Deleted

## 2019-09-12 DIAGNOSIS — M1991 Primary osteoarthritis, unspecified site: Secondary | ICD-10-CM | POA: Diagnosis not present

## 2019-09-12 DIAGNOSIS — E039 Hypothyroidism, unspecified: Secondary | ICD-10-CM | POA: Diagnosis not present

## 2019-09-12 DIAGNOSIS — T84093D Other mechanical complication of internal left knee prosthesis, subsequent encounter: Secondary | ICD-10-CM | POA: Diagnosis not present

## 2019-09-12 DIAGNOSIS — K219 Gastro-esophageal reflux disease without esophagitis: Secondary | ICD-10-CM | POA: Diagnosis not present

## 2019-09-12 DIAGNOSIS — H3530 Unspecified macular degeneration: Secondary | ICD-10-CM | POA: Diagnosis not present

## 2019-09-12 DIAGNOSIS — F419 Anxiety disorder, unspecified: Secondary | ICD-10-CM | POA: Diagnosis not present

## 2019-09-12 DIAGNOSIS — F329 Major depressive disorder, single episode, unspecified: Secondary | ICD-10-CM | POA: Diagnosis not present

## 2019-09-12 DIAGNOSIS — F4310 Post-traumatic stress disorder, unspecified: Secondary | ICD-10-CM | POA: Diagnosis not present

## 2019-09-12 DIAGNOSIS — I10 Essential (primary) hypertension: Secondary | ICD-10-CM | POA: Diagnosis not present

## 2019-09-14 DIAGNOSIS — F419 Anxiety disorder, unspecified: Secondary | ICD-10-CM | POA: Diagnosis not present

## 2019-09-14 DIAGNOSIS — H3530 Unspecified macular degeneration: Secondary | ICD-10-CM | POA: Diagnosis not present

## 2019-09-14 DIAGNOSIS — F4310 Post-traumatic stress disorder, unspecified: Secondary | ICD-10-CM | POA: Diagnosis not present

## 2019-09-14 DIAGNOSIS — E039 Hypothyroidism, unspecified: Secondary | ICD-10-CM | POA: Diagnosis not present

## 2019-09-14 DIAGNOSIS — F329 Major depressive disorder, single episode, unspecified: Secondary | ICD-10-CM | POA: Diagnosis not present

## 2019-09-14 DIAGNOSIS — T84093D Other mechanical complication of internal left knee prosthesis, subsequent encounter: Secondary | ICD-10-CM | POA: Diagnosis not present

## 2019-09-14 DIAGNOSIS — K219 Gastro-esophageal reflux disease without esophagitis: Secondary | ICD-10-CM | POA: Diagnosis not present

## 2019-09-14 DIAGNOSIS — M1991 Primary osteoarthritis, unspecified site: Secondary | ICD-10-CM | POA: Diagnosis not present

## 2019-09-14 DIAGNOSIS — I10 Essential (primary) hypertension: Secondary | ICD-10-CM | POA: Diagnosis not present

## 2019-09-17 DIAGNOSIS — H3530 Unspecified macular degeneration: Secondary | ICD-10-CM | POA: Diagnosis not present

## 2019-09-17 DIAGNOSIS — K219 Gastro-esophageal reflux disease without esophagitis: Secondary | ICD-10-CM | POA: Diagnosis not present

## 2019-09-17 DIAGNOSIS — F4310 Post-traumatic stress disorder, unspecified: Secondary | ICD-10-CM | POA: Diagnosis not present

## 2019-09-17 DIAGNOSIS — E039 Hypothyroidism, unspecified: Secondary | ICD-10-CM | POA: Diagnosis not present

## 2019-09-17 DIAGNOSIS — I10 Essential (primary) hypertension: Secondary | ICD-10-CM | POA: Diagnosis not present

## 2019-09-17 DIAGNOSIS — F329 Major depressive disorder, single episode, unspecified: Secondary | ICD-10-CM | POA: Diagnosis not present

## 2019-09-17 DIAGNOSIS — M1991 Primary osteoarthritis, unspecified site: Secondary | ICD-10-CM | POA: Diagnosis not present

## 2019-09-17 DIAGNOSIS — F419 Anxiety disorder, unspecified: Secondary | ICD-10-CM | POA: Diagnosis not present

## 2019-09-17 DIAGNOSIS — T84093D Other mechanical complication of internal left knee prosthesis, subsequent encounter: Secondary | ICD-10-CM | POA: Diagnosis not present

## 2019-09-19 DIAGNOSIS — M1991 Primary osteoarthritis, unspecified site: Secondary | ICD-10-CM | POA: Diagnosis not present

## 2019-09-19 DIAGNOSIS — T84093D Other mechanical complication of internal left knee prosthesis, subsequent encounter: Secondary | ICD-10-CM | POA: Diagnosis not present

## 2019-09-19 DIAGNOSIS — I10 Essential (primary) hypertension: Secondary | ICD-10-CM | POA: Diagnosis not present

## 2019-09-19 DIAGNOSIS — F329 Major depressive disorder, single episode, unspecified: Secondary | ICD-10-CM | POA: Diagnosis not present

## 2019-09-19 DIAGNOSIS — F4310 Post-traumatic stress disorder, unspecified: Secondary | ICD-10-CM | POA: Diagnosis not present

## 2019-09-19 DIAGNOSIS — K219 Gastro-esophageal reflux disease without esophagitis: Secondary | ICD-10-CM | POA: Diagnosis not present

## 2019-09-19 DIAGNOSIS — H3530 Unspecified macular degeneration: Secondary | ICD-10-CM | POA: Diagnosis not present

## 2019-09-19 DIAGNOSIS — E039 Hypothyroidism, unspecified: Secondary | ICD-10-CM | POA: Diagnosis not present

## 2019-09-19 DIAGNOSIS — F419 Anxiety disorder, unspecified: Secondary | ICD-10-CM | POA: Diagnosis not present

## 2019-09-21 DIAGNOSIS — F329 Major depressive disorder, single episode, unspecified: Secondary | ICD-10-CM | POA: Diagnosis not present

## 2019-09-21 DIAGNOSIS — F4310 Post-traumatic stress disorder, unspecified: Secondary | ICD-10-CM | POA: Diagnosis not present

## 2019-09-21 DIAGNOSIS — M1991 Primary osteoarthritis, unspecified site: Secondary | ICD-10-CM | POA: Diagnosis not present

## 2019-09-21 DIAGNOSIS — F419 Anxiety disorder, unspecified: Secondary | ICD-10-CM | POA: Diagnosis not present

## 2019-09-21 DIAGNOSIS — K219 Gastro-esophageal reflux disease without esophagitis: Secondary | ICD-10-CM | POA: Diagnosis not present

## 2019-09-21 DIAGNOSIS — T84093D Other mechanical complication of internal left knee prosthesis, subsequent encounter: Secondary | ICD-10-CM | POA: Diagnosis not present

## 2019-09-21 DIAGNOSIS — E039 Hypothyroidism, unspecified: Secondary | ICD-10-CM | POA: Diagnosis not present

## 2019-09-21 DIAGNOSIS — H3530 Unspecified macular degeneration: Secondary | ICD-10-CM | POA: Diagnosis not present

## 2019-09-21 DIAGNOSIS — I10 Essential (primary) hypertension: Secondary | ICD-10-CM | POA: Diagnosis not present

## 2019-09-24 DIAGNOSIS — F419 Anxiety disorder, unspecified: Secondary | ICD-10-CM | POA: Diagnosis not present

## 2019-09-24 DIAGNOSIS — E039 Hypothyroidism, unspecified: Secondary | ICD-10-CM | POA: Diagnosis not present

## 2019-09-24 DIAGNOSIS — T84093D Other mechanical complication of internal left knee prosthesis, subsequent encounter: Secondary | ICD-10-CM | POA: Diagnosis not present

## 2019-09-24 DIAGNOSIS — M1991 Primary osteoarthritis, unspecified site: Secondary | ICD-10-CM | POA: Diagnosis not present

## 2019-09-24 DIAGNOSIS — I10 Essential (primary) hypertension: Secondary | ICD-10-CM | POA: Diagnosis not present

## 2019-09-24 DIAGNOSIS — F4310 Post-traumatic stress disorder, unspecified: Secondary | ICD-10-CM | POA: Diagnosis not present

## 2019-09-24 DIAGNOSIS — H3530 Unspecified macular degeneration: Secondary | ICD-10-CM | POA: Diagnosis not present

## 2019-09-24 DIAGNOSIS — F329 Major depressive disorder, single episode, unspecified: Secondary | ICD-10-CM | POA: Diagnosis not present

## 2019-09-24 DIAGNOSIS — K219 Gastro-esophageal reflux disease without esophagitis: Secondary | ICD-10-CM | POA: Diagnosis not present

## 2019-09-25 DIAGNOSIS — M545 Low back pain: Secondary | ICD-10-CM | POA: Diagnosis not present

## 2019-09-25 DIAGNOSIS — M25562 Pain in left knee: Secondary | ICD-10-CM | POA: Diagnosis not present

## 2019-09-25 DIAGNOSIS — Z96652 Presence of left artificial knee joint: Secondary | ICD-10-CM | POA: Diagnosis not present

## 2019-09-25 DIAGNOSIS — Z9889 Other specified postprocedural states: Secondary | ICD-10-CM | POA: Diagnosis not present

## 2019-09-25 DIAGNOSIS — M546 Pain in thoracic spine: Secondary | ICD-10-CM | POA: Diagnosis not present

## 2019-09-26 ENCOUNTER — Ambulatory Visit (HOSPITAL_COMMUNITY): Payer: Medicare HMO | Attending: Orthopedic Surgery | Admitting: Physical Therapy

## 2019-09-26 ENCOUNTER — Other Ambulatory Visit: Payer: Self-pay

## 2019-09-26 ENCOUNTER — Ambulatory Visit (HOSPITAL_COMMUNITY): Payer: Medicare HMO | Admitting: Physical Therapy

## 2019-09-26 DIAGNOSIS — R262 Difficulty in walking, not elsewhere classified: Secondary | ICD-10-CM | POA: Insufficient documentation

## 2019-09-26 DIAGNOSIS — M25562 Pain in left knee: Secondary | ICD-10-CM | POA: Diagnosis not present

## 2019-09-26 DIAGNOSIS — M6281 Muscle weakness (generalized): Secondary | ICD-10-CM | POA: Insufficient documentation

## 2019-09-26 DIAGNOSIS — G8929 Other chronic pain: Secondary | ICD-10-CM

## 2019-09-26 NOTE — Therapy (Signed)
[place], [place], [address] Phone: [phone]   Fax:  [phone]  Physical Therapy Evaluation  Patient Details  Name: Victoria Deleon MRN: [id number] Date of Birth: Jul 07, 1965 Referring Provider (PT): Ulyses Southward   Encounter Date: 09/26/2019  PT End of Session - 09/26/19 1357    Visit Number  1    Number of Visits  18    Date for PT Re-Evaluation  11/21/19   eval 09/26/19   Authorization Type  [employer]. MEd necessity, auth required    Progress Note Due on Visit  10    PT Start Time  1400    PT Stop Time  1440    PT Time Calculation (min)  40 min    Activity Tolerance  Patient tolerated treatment well    Behavior During Therapy  WFL for tasks assessed/performed       Past Medical History:  Diagnosis Date  . Anxiety   . Arthritis   . Crohn disease ([place])   . Depression   . History of kidney stones   . Hypertension   . Hypothyroidism   . Macular degeneration   . PTSD (post-traumatic stress disorder)     Past Surgical History:  Procedure Laterality Date  . ABDOMINAL HYSTERECTOMY    . APPENDECTOMY    . BIOPSY  04/24/2018   Procedure: BIOPSY;  Surgeon: Daneil Dolin, MD;  Location: [facility];  Service: Endoscopy;;  (gastric/duodenal)  . CHOLECYSTECTOMY    . COLONOSCOPY WITH PROPOFOL N/A 04/24/2018   Dr. Gala Romney: Normal terminal ileum with negative biopsies, normal-appearing colon with random colon biopsies negative, internal hemorrhoids.  Next colonoscopy 10 years.  . ESOPHAGOGASTRODUODENOSCOPY (EGD) WITH PROPOFOL N/A 04/24/2018   Dr. Gala Romney: Severe ulcerative reflux esophagitis, small hiatal hernia, small bowel biopsies negative for celiac.  [facility] MEDIAL PARTIAL KNEE REPLACEMENT Left   . TOTAL KNEE REVISION Left 09/10/2019   Procedure: LEFT TOTAL KNEE REVISION;  Surgeon: Frederik Pear, MD;  Location: [facility];  Service: Orthopedics;  Laterality: Left;    There were no vitals filed for this  visit.   Subjective Assessment - 09/26/19 1546    Subjective  Patient reports she had a knee replacement in 2007 and she was doing well up until 2 years ago. States her knee just started hurting and progressively worsened. States that she elected to have a knee revision secondary to loosening of hardware. DOS 09/10/19. States she just had her bandage off at the MD. States she just got down to a cane she was using a walker. States she is not sleeping well because her knee feels crampy and tight. States she is icing a lot. She is a person that likes to get up and go, she has a chicken coup and a garden she tends to (lives on 2 acres). Current pain level is about a 7/10 as it is late in the afternoon. She just took a pain pill. Reports she also has back pain that they are going to start doing injections in and she will eventually need surgery. States she needs to have PT on her back too but would like to focus on her knee.    Pertinent History  anxiety, PTSD, macular degeneration, HTN, depression, crohn disease    Limitations  House hold activities;Walking;Standing    Patient Stated Goals  to be able to garden walk without cane and to kneel    Currently in Pain?  Yes  Pain Score  7     Pain Location  Knee    Pain Orientation  Left    Pain Descriptors / Indicators  Aching;Discomfort;Cramping    Pain Type  Surgical pain    Pain Radiating Towards  around knee    Pain Onset  In the past 7 days    Aggravating Factors   walking, standing, bending    Pain Relieving Factors  ice/elevation/medications         [facility] PT Assessment - 09/26/19 0001      Assessment   Medical Diagnosis  s/p Left TKA revision    Referring Provider (PT)  Ulyses Southward    Onset Date/Surgical Date  09/10/19    Prior Therapy  yes      Precautions   Precautions  Fall      Restrictions   Weight Bearing Restrictions  No      Balance Screen   Has the patient fallen in the past 6 months  No    Has the patient had a decrease  in activity level because of a fear of falling?   Yes    Is the patient reluctant to leave their home because of a fear of falling?   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 swelling measured at knee joint line 43.6 cm L 39.0cm R     Focus on Therapeutic Outcomes (FOTO)   68% limitation       ROM / Strength   AROM / PROM / Strength  AROM;Strength      AROM   AROM Assessment Site  Knee    Right/Left Knee  Left;Right    Right Knee Extension  0    Right Knee Flexion  135    Left Knee Extension  8   lacking   Left Knee Flexion  82      Strength   Overall Strength Comments  good quad isometric      Ambulation/Gait   Ambulation/Gait  Yes    Ambulation/Gait Assistance  6: Modified independent (Device/Increase time)    Ambulation Distance (Feet)  212 Feet    Assistive device  Straight cane    Gait Pattern  Decreased stance time - left;Decreased hip/knee flexion - left;Left foot flat    Ambulation Surface  Level;Indoor    Gait velocity  decreased    Gait Comments  2MW                 Objective measurements completed on examination: See above findings.      [facility] Adult PT Treatment/Exercise - 09/26/19 0001      Transfers   Transfers  Sit to Stand;Stand to  Sit    Sit to Stand  6: Modified independent (Device/Increase time);With upper  extremity assist    Stand to Sit  6: Modified independent (Device/Increase time);With upper extremity assist      Exercises   Exercises  Knee/Hip      Knee/Hip Exercises: Stretches   Gastroc Stretch  Left;3 reps;30 seconds   strap   Other Knee/Hip Stretches  seated AAROM left knee 10" holds x5 - assist by other knee      Knee/Hip Exercises: Supine   Straight Leg Raises  1 set;5 reps;Left;Strengthening;AROM             PT Education - 09/26/19 1544    Education Details  in current presentation. reviewed HEP with HHPT. Answered all questions. Educated patient in restricting certain things (walking on uneven surfaces and working in the garden too much) to ensure prosthesis heals well and decrease risk of hardware loosening.    Person(s) Educated  Patient    Methods  Explanation    Comprehension  Verbalized understanding       PT Short Term Goals - 09/26/19 1542      PT SHORT TERM GOAL #1   Title  Patient will be independent in self management strategies to improve quality of life and functional outcomes.    Time  4    Period  Weeks    Status  New    Target Date  10/24/19      PT SHORT TERM GOAL #2   Title  Patient will report at least 50% improvement in overall symptoms and/or functional ability.    Time  4    Period  Weeks    Status  New    Target Date  10/24/19      PT SHORT TERM GOAL #3   Title  Patient will be able to ambulate at least 300 feet in 2 minutes to demonstrate improved ambulatory mobility.    Time  4    Period  Weeks    Status  New    Target Date  10/24/19        PT Long Term Goals - 09/26/19 1542      PT LONG TERM GOAL #1   Title  Patient will be able to ascend and descend stairs with reciprocal gait pattern to demonstrate improved stair mechanics and improve ability to go up/down stairs in her home.    Time  8    Period  Weeks    Status  New    Target Date  11/21/19      PT LONG TERM GOAL #2   Title  Patient will be able to demonstrate  at least 2-120 degrees of left knee ROM to demonstrate improved knee mobility    Time  8    Period  Weeks    Status  New    Target Date  11/21/19      PT LONG TERM GOAL #3   Title  Patient will report at least 75% improvement in overall symptoms and/or functional ability.    Time  8    Period  Weeks    Status  New    Target Date  11/21/19             Plan - 09/26/19 1538    Clinical Impression Statement  Patient is s/p left TKA revision on 09/10/19. She presents with good ROM and walking mechanics given she is 2 weeks post operative but does present with limitation in knee  ROM, walking, and LE strength. Educated patient in typical post op care and reviewed home exercise program. Answered all questions prior to end of session. Patient would greatly benefit from skilled physical therapy to improve functional mobility and assist in post operative rehabilitation.    Personal Factors and Comorbidities  Comorbidity 1;Comorbidity 2;Comorbidity 3+    Comorbidities  hx of anxiety, PTSD, macular degeneration, HTN, depression, crohn disease, LBP    Examination-Activity Limitations  Bend;Squat;Stairs;Stand;Transfers;Locomotion Level;Dressing;Sleep    Examination-Participation Restrictions  Cleaning;Community Activity;Yard Work;Meal Prep    Stability/Clinical Decision Making  Stable/Uncomplicated    Clinical Decision Making  Low    Rehab Potential  Good    PT Frequency  Other (comment)   3x/week for 2 weeks then 2x/week for 6 weeks   PT Duration  8 weeks    PT Treatment/Interventions  ADLs/Self Care Home Management;Aquatic Therapy;Cryotherapy;Electrical Stimulation;Moist Heat;Traction;Balance training;Therapeutic exercise;Manual techniques;Therapeutic activities;Stair training;Gait training;DME Instruction;Neuromuscular re-education;Patient/family education;Dry needling    PT Next Visit Plan  patient prefers treatment in private treatment room secondary to anxiety. focus on walking mechanics,  knee flexion and extension and quad strengthening to improve knee ROM.    PT Home Exercise Plan  4/28, AAROM knee flexion EOB, calf stretch, continue HHPT exercises    Consulted and Agree with Plan of Care  Patient       Patient will benefit from skilled therapeutic intervention in order to improve the following deficits and impairments:  Decreased range of motion, Difficulty walking, Decreased mobility, Pain, Increased edema, Decreased scar mobility, Abnormal gait, Decreased endurance, Decreased skin integrity, Decreased activity tolerance, Decreased strength, Decreased balance  Visit Diagnosis: Difficulty in walking, not elsewhere classified  Chronic pain of left knee  Muscle weakness (generalized)     Problem List Patient Active Problem List   Diagnosis Date Noted  . 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 GERD (gastroesophageal reflux disease) 07/27/2018  . Constipation 07/27/2018  . Nonspecific abnormal electrocardiogram (ECG) (EKG) 07/27/2018  . Crohn's disease ([place]) 02/13/2018   4:02 PM, 09/26/19 Jerene Pitch, DPT Physical Therapy with [facility]  [phone] office  [place] [address] [place], [place], [address] Phone: [phone]   Fax:  [phone]  Name: Victoria Deleon MRN: [id number] Date of Birth: [birth year]

## 2019-09-28 NOTE — Discharge Summary (Signed)
Patient ID: Victoria Deleon MRN: [id number] DOB/AGE: 02/09/1966 54 y.o.  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 Anxiety   . Arthritis   . Crohn disease ([place])   . Depression   . History of kidney stones   . Hypertension   . Hypothyroidism   . Macular degeneration   . PTSD (post-traumatic stress disorder)     Surgeries: Procedure(s): LEFT TOTAL KNEE REVISION on 09/10/2019   Consultants:   Discharged Condition: Improved  Hospital Course: Victoria Deleon is an 54 y.o. female who was admitted 09/10/2019 for operative treatment ofPain due to unicompartmental arthroplasty of knee ([place]). Patient has severe unremitting pain that affects sleep, daily activities, and work/hobbies. After pre-op clearance the patient was taken to the operating room on 09/10/2019 and underwent  Procedure(s): LEFT TOTAL KNEE REVISION.    Patient was given perioperative antibiotics:  Anti-infectives (From admission, onward)   Start     Dose/Rate Route Frequency Ordered Stop   09/10/19 0630  ceFAZolin (ANCEF) IVPB 2g/100 mL premix     2 g 200 mL/hr over 30 Minutes Intravenous On call to O.R. 09/10/19 6629 09/10/19 4765       Patient was given sequential compression devices, early ambulation, and chemoprophylaxis to prevent DVT.  Patient benefited maximally from hospital stay and there were no complications.    Recent vital signs: No data found.   Recent laboratory studies: No results for input(s): WBC, HGB, HCT, PLT, NA, K, CL, CO2, BUN, CREATININE, GLUCOSE, INR, CALCIUM in the last 72 hours.  Invalid input(s): PT, 2   Discharge Medications:   Allergies as of 09/10/2019      Reactions   Codeine Anaphylaxis      Medication List    TAKE  these medications   aspirin EC 81 MG tablet Take 1 tablet (81 mg total) by mouth 2 (two) times daily.   atorvastatin 40 MG tablet Commonly known as: LIPITOR Take 40 mg by mouth at bedtime.   busPIRone 30 MG tablet Commonly known as: BUSPAR Take 30 mg by mouth daily.   citalopram 20 MG tablet Commonly known as: CELEXA Take 20 mg by mouth daily.   doxepin 10 MG capsule Commonly known as: SINEQUAN Take 10 mg by mouth every evening.   DULoxetine 30 MG capsule Commonly known as: CYMBALTA Take 30 mg by mouth daily.   FIBER PO Take 1 capsule by mouth 2 (two) times daily.   gabapentin 100 MG capsule Commonly known as: NEURONTIN Take 1 capsule (100 mg total) by mouth 3 (three) times daily.   levothyroxine 100 MCG tablet Commonly known as: SYNTHROID Take 100 mcg by mouth every evening.   Linzess 72 MCG capsule Generic drug: linaclotide Take 72 mcg by mouth daily.   metoprolol succinate 25 MG 24 hr tablet Commonly known as: TOPROL-XL Take 25 mg by mouth daily.   multivitamin with minerals Tabs tablet Take 1 tablet by mouth daily.   naproxen sodium 220 MG tablet Commonly known as: ALEVE Take 440 mg by mouth daily as needed (for pain or headache).   oxyCODONE-acetaminophen 5-325 MG tablet Commonly known as: PERCOCET/ROXICET Take 1 tablet by mouth every 4 (four) hours as needed for severe pain.   pantoprazole 40 MG tablet Commonly known  as: PROTONIX Take 40 mg by mouth daily.   PreserVision AREDS 2 Caps Take 1 capsule by mouth in the morning and at bedtime.   tiZANidine 2 MG tablet Commonly known as: ZANAFLEX Take 1 tablet (2 mg total) by mouth every 6 (six) hours as needed.            Discharge Care Instructions  (From admission, onward)         Start     Ordered   09/10/19 0000  Weight bearing as tolerated     09/10/19 1547          Diagnostic Studies: DG Chest 2 View  Result Date: 08/30/2019 CLINICAL DATA:  Preoperative respiratory evaluation  for knee replacement. EXAM: CHEST - 2 VIEW COMPARISON:  None. FINDINGS: The lungs are clear without focal pneumonia, edema, pneumothorax or pleural effusion. Interstitial markings are diffusely coarsened with chronic features. The cardiopericardial silhouette is within normal limits for size. The visualized bony structures of the thorax are intact. IMPRESSION: No active cardiopulmonary disease. Electronically Signed   By: Misty Stanley M.D.   On: 08/30/2019 10:20    Disposition: Discharge disposition: 01-Home or Self Care       Discharge Instructions    Call MD / Call 911   Complete by: As directed    If you experience chest pain or shortness of breath, CALL 911 and be transported to the hospital emergency room.  If you develope a fever above 101 F, pus (white drainage) or increased drainage or redness at the wound, or calf pain, call your surgeon's office.   Constipation Prevention   Complete by: As directed    Drink plenty of fluids.  Prune juice may be helpful.  You may use a stool softener, such as Colace (over the counter) 100 mg twice a day.  Use MiraLax (over the counter) for constipation as needed.   Diet - low sodium heart healthy   Complete by: As directed    Driving restrictions   Complete by: As directed    No driving for 2 weeks   Increase activity slowly as tolerated   Complete by: As directed    Patient may shower   Complete by: As directed    You may shower without a dressing once there is no drainage.  Do not wash over the wound.  If drainage remains, cover wound with plastic wrap and then shower.   Weight bearing as tolerated   Complete by: As directed       Follow-up Information    Frederik Pear, MD Follow up in 2 week(s).   Specialty: Orthopedic Surgery Contact information: [facility] [address] [phone]            Signed: Joanell Rising 09/28/2019, 3:02 PM

## 2019-10-01 ENCOUNTER — Other Ambulatory Visit: Payer: Self-pay

## 2019-10-01 ENCOUNTER — Ambulatory Visit (HOSPITAL_COMMUNITY): Payer: Medicare HMO | Attending: Orthopedic Surgery | Admitting: Physical Therapy

## 2019-10-01 DIAGNOSIS — G8929 Other chronic pain: Secondary | ICD-10-CM | POA: Insufficient documentation

## 2019-10-01 DIAGNOSIS — M25562 Pain in left knee: Secondary | ICD-10-CM | POA: Diagnosis not present

## 2019-10-01 DIAGNOSIS — M6281 Muscle weakness (generalized): Secondary | ICD-10-CM | POA: Diagnosis not present

## 2019-10-01 DIAGNOSIS — R262 Difficulty in walking, not elsewhere classified: Secondary | ICD-10-CM | POA: Insufficient documentation

## 2019-10-01 NOTE — Therapy (Addendum)
[facility] [place], [place], [address] Phone: [phone]   Fax:  [phone]  Physical Therapy Treatment  Patient Details  Name: Victoria Deleon MRN: [id number] Date of Birth: 22-Oct-1965 Referring Provider (PT): Ulyses Southward   Encounter Date: 10/01/2019        PT End of Session - 10/01/19 1605     Visit Number  2     Number of Visits  18     Date for PT Re-Evaluation  11/21/19   eval 09/26/19    Authorization Type  [employer] H[employer]. MEd necessity, auth required     Progress Note Due on Visit  10     PT Start Time  1452     PT Stop Time  1535     PT Time Calculation (min)  43 min     Activity Tolerance  Patient tolerated treatment well     Behavior During Therapy  WFL for tasks assessed/performed          Subjective:  Pt states she is getting a lot of cramps in her leg that usually wakes her around 4 am and she has to Get up and walk. Currently pain is 10/10 but denies need to go to ED.    Knee/Hip Exercises: Stretches  Knee: Self-Stretch to increase Flexion  10 seconds;Left   Knee: Self-Stretch Limitations  onto 7" step 10 reps   Gastroc Stretch  Left;3 reps;30 seconds   strap  Other Knee/Hip Stretches  --     Knee/Hip Exercises: Aerobic  Stationary Bike  at BOS rocking seat at 5      Knee/Hip Exercises: Seated  Long Arc Quad  Left;10 reps   [employer] Limitations  5 second hold   Other Seated Knee/Hip Exercises  flexion with self stretch edge of mat 3X20"     Knee/Hip Exercises: Supine  Quad Sets  Left;10 reps   Quad Sets Limitations  5 second hold   Heel Slides  Left;10 reps   Bridges  Both;10 reps   Straight Leg Raises  Left;Strengthening;AROM;10 reps   Knee Extension  AROM   Knee Extension Limitations  6   Knee Flexion  AROM   Knee Flexion Limitations  95     Knee/Hip Exercises: Prone  Hamstring Curl  10 reps   Hip Extension  Right;10 reps     Manual Therapy  Manual Therapy  Soft  tissue mobilization;Myofascial release   Manual therapy comments  to anterior and posterior knee;completed seperately from all other skilled interventions   Soft tissue mobilization  to reduce restrictions in knee   Myofascial Release  to reduce adhesions     Past Medical History:  Diagnosis Date  . Anxiety   . Arthritis   . Crohn disease ([place])   . Depression   . History of kidney stones   . Hypertension   . Hypothyroidism   . Macular degeneration   . PTSD (post-traumatic stress disorder)     Past Surgical History:  Procedure Laterality Date  . ABDOMINAL HYSTERECTOMY    . APPENDECTOMY    . BIOPSY  04/24/2018   Procedure: BIOPSY;  Surgeon: Daneil Dolin, MD;  [facility];  Service: Endoscopy;;  (gastric/duodenal)  . CHOLECYSTECTOMY    . COLONOSCOPY WITH PROPOFOL N/A 04/24/2018   Dr. Gala Romney: Normal terminal ileum with negative biopsies, normal-appearing colon with random colon biopsies negative, internal hemorrhoids.  Next colonoscopy 10 years.  . ESOPHAGOGASTRODUODENOSCOPY (EGD)  WITH PROPOFOL N/A 04/24/2018   Dr. Gala Romney: Severe ulcerative reflux esophagitis, small hiatal hernia, small bowel biopsies negative for celiac.  Marland Kitchen MEDIAL PARTIAL KNEE REPLACEMENT Left   . TOTAL KNEE REVISION Left 09/10/2019   Procedure: LEFT TOTAL KNEE REVISION;  Surgeon: Frederik Pear, MD;  [facility];  Service: Orthopedics;  Laterality: Left;    There were no vitals filed for this visit.    PT Short Term Goals - 10/01/19 1530      PT SHORT TERM GOAL #1   Title  Patient will be independent in self management strategies to improve quality of life and functional outcomes.    Time  4    Period  Weeks    Status  On-going    Target Date  10/24/19      PT SHORT TERM GOAL #2   Title  Patient will report at least 50% improvement in overall symptoms and/or functional ability.    Time  4    Period  Weeks    Status  New    Target Date  10/24/19      PT SHORT TERM GOAL #3   Title   Patient will be able to ambulate at least 300 feet in 2 minutes to demonstrate improved ambulatory mobility.    Time  4    Period  Weeks    Status  On-going    Target Date  10/24/19        PT Long Term Goals - 10/01/19 1531      PT LONG TERM GOAL #1   Title  Patient will be able to ascend and descend stairs with reciprocal gait pattern to demonstrate improved stair mechanics and improve ability to go up/down stairs in her home.    Time  8    Period  Weeks    Status  On-going      PT LONG TERM GOAL #2   Title  Patient will be able to demonstrate at least 2-120 degrees of left knee ROM to demonstrate improved knee mobility    Time  8    Period  Weeks    Status  On-going      PT LONG TERM GOAL #3   Title  Patient will report at least 75% improvement in overall symptoms and/or functional ability.    Time  8    Period  Weeks    Status  On-going         Plan - 10/01/19 1607    Clinical Impression Statement  Pt returns today with C/O cramps that woke her from sleep and states her pain is really high today.  Reviewed HEP and worked on mobility this session.  Manual completed at EOS to both anterior (supine) and posterior (prone).  Pt with edema and adhesions primary on lateral side of knee that improved with reduced pain to 8/10 at end of session.  Unable to get ROM any further than 8-80 degrees today.  Encouraged to focus on improving her ROM and completing self manual at home as well to help loosen tissue and improve flexibility.    Personal Factors and Comorbidities  Comorbidity 1;Comorbidity 2;Comorbidity 3+    Comorbidities  hx of anxiety, PTSD, macular degeneration, HTN, depression, crohn disease, LBP    Examination-Activity Limitations  Bend;Squat;Stairs;Stand;Transfers;Locomotion Level;Dressing;Sleep    Examination-Participation Restrictions  Cleaning;Community Activity;Yard Work;Meal Prep    Stability/Clinical Decision Making  Stable/Uncomplicated    Rehab Potential   Good    PT Frequency  Other (comment)  3x/week for 2 weeks then 2x/week for 6 weeks   PT Duration  8 weeks    PT Treatment/Interventions  ADLs/Self Care Home Management;Aquatic Therapy;Cryotherapy;Electrical Stimulation;Moist Heat;Traction;Balance training;Therapeutic exercise;Manual techniques;Therapeutic activities;Stair training;Gait training;DME Instruction;Neuromuscular re-education;Patient/family education;Dry needling    PT Next Visit Plan  patient prefers treatment in private treatment room secondary to anxiety. focus on walking mechanics and ROM initially and progress strength.  Next session begin bike rocking for ROM and add standing stretches.    PT Home Exercise Plan  4/28, AAROM knee flexion EOB, calf stretch, continue HHPT exercises    Consulted and Agree with Plan of Care  Patient             Patient will benefit from skilled therapeutic intervention in order to improve the following deficits and impairments:  Decreased range of motion, Difficulty walking, Decreased mobility, Pain, Increased edema, Decreased scar mobility, Abnormal gait, Decreased endurance, Decreased skin integrity, Decreased activity tolerance, Decreased strength, Decreased balance  Visit Diagnosis: Difficulty in walking, not elsewhere classified  Chronic pain of left knee  Muscle weakness (generalized)     Problem List Patient Active Problem List   Diagnosis Date Noted  . 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 GERD (gastroesophageal reflux disease) 07/27/2018  . Constipation 07/27/2018  . Nonspecific abnormal electrocardiogram (ECG) (EKG) 07/27/2018  . Crohn's disease ([place]) 02/13/2018   Teena Irani, PTA/CLT [phone]  Teena Irani 10/03/2019, 3:49 PM  [facility] [address] [place], [place], [address] Phone: [phone]   Fax:   [phone]  Name: Victoria Deleon MRN: [id number] Date of Birth: 10/05/1965

## 2019-10-03 ENCOUNTER — Ambulatory Visit (HOSPITAL_COMMUNITY): Payer: Medicare HMO | Admitting: Physical Therapy

## 2019-10-03 ENCOUNTER — Other Ambulatory Visit: Payer: Self-pay

## 2019-10-03 DIAGNOSIS — M6281 Muscle weakness (generalized): Secondary | ICD-10-CM

## 2019-10-03 DIAGNOSIS — G8929 Other chronic pain: Secondary | ICD-10-CM

## 2019-10-03 DIAGNOSIS — M25562 Pain in left knee: Secondary | ICD-10-CM | POA: Diagnosis not present

## 2019-10-03 DIAGNOSIS — R262 Difficulty in walking, not elsewhere classified: Secondary | ICD-10-CM | POA: Diagnosis not present

## 2019-10-03 NOTE — Therapy (Signed)
[facility] [place], [place], [address] Phone: [phone]   Fax:  [phone]  Physical Therapy Treatment  Patient Details  Name: Victoria Deleon MRN: [id number] Date of Birth: 08-26-1965 Referring Provider (PT): Ulyses Southward   Encounter Date: 10/03/2019  PT End of Session - 10/03/19 1534    Visit Number  3    Number of Visits  18    Date for PT Re-Evaluation  11/21/19   eval 09/26/19   Authorization Type  [employer] H[employer]. MEd necessity, auth required    Progress Note Due on Visit  10    PT Start Time  1405    PT Stop Time  1448    PT Time Calculation (min)  43 min    Activity Tolerance  Patient tolerated treatment well    Behavior During Therapy  WFL for tasks assessed/performed       Past Medical History:  Diagnosis Date  . Anxiety   . Arthritis   . Crohn disease ([place])   . Depression   . History of kidney stones   . Hypertension   . Hypothyroidism   . Macular degeneration   . PTSD (post-traumatic stress disorder)     Past Surgical History:  Procedure Laterality Date  . ABDOMINAL HYSTERECTOMY    . APPENDECTOMY    . BIOPSY  04/24/2018   Procedure: BIOPSY;  Surgeon: Daneil Dolin, MD;  [facility];  Service: Endoscopy;;  (gastric/duodenal)  . CHOLECYSTECTOMY    . COLONOSCOPY WITH PROPOFOL N/A 04/24/2018   Dr. Gala Romney: Normal terminal ileum with negative biopsies, normal-appearing colon with random colon biopsies negative, internal hemorrhoids.  Next colonoscopy 10 years.  . ESOPHAGOGASTRODUODENOSCOPY (EGD) WITH PROPOFOL N/A 04/24/2018   Dr. Gala Romney: Severe ulcerative reflux esophagitis, small hiatal hernia, small bowel biopsies negative for celiac.  Marland Kitchen MEDIAL PARTIAL KNEE REPLACEMENT Left   . TOTAL KNEE REVISION Left 09/10/2019   Procedure: LEFT TOTAL KNEE REVISION;  Surgeon: Frederik Pear, MD;  [facility];  Service: Orthopedics;  Laterality: Left;    There were no vitals filed for this  visit.  Subjective Assessment - 10/03/19 1421    Subjective  pt states she's been doing her HEP and she is a little sore from the the new exercises and stretches.  States the cramping is much better since beginning the stretches and exercises last session.    Currently in Pain?  Yes    Pain Score  6     Pain Location  Knee    Pain Orientation  Left    Pain Descriptors / Indicators  Aching;Tender;Sore    Pain Type  Surgical pain                       [facility] Adult PT Treatment/Exercise - 10/03/19 1405      Transfers   Transfers  --    Sit to Stand  --    Stand to Sit  --      Ambulation/Gait   Ambulation/Gait  --    Ambulation/Gait Assistance  --    Ambulation Distance (Feet)  --    Assistive device  --    Gait Pattern  --    Gait velocity  --    Gait Comments  --      Exercises   Exercises  Knee/Hip      Knee/Hip Exercises: Stretches   Knee: Self-Stretch to increase Flexion  10 seconds;Left  Knee: Self-Stretch Limitations  onto 7" step 10 reps    Gastroc Stretch  Left;3 reps;30 seconds   strap   Other Knee/Hip Stretches  --      Knee/Hip Exercises: Aerobic   Stationary Bike  at BOS rocking seat at 5       Knee/Hip Exercises: Seated   Long Arc Quad  Left;10 reps    [employer] Limitations  5 second hold    Other Seated Knee/Hip Exercises  flexion with self stretch edge of mat 3X20"      Knee/Hip Exercises: Supine   Quad Sets  Left;10 reps    Quad Sets Limitations  5 second hold    Heel Slides  Left;10 reps    Bridges  Both;10 reps    Straight Leg Raises  Left;Strengthening;AROM;10 reps    Knee Extension  AROM    Knee Extension Limitations  6    Knee Flexion  AROM    Knee Flexion Limitations  95      Knee/Hip Exercises: Prone   Hamstring Curl  10 reps    Hip Extension  Right;10 reps      Manual Therapy   Manual Therapy  Soft tissue mobilization;Myofascial release    Manual therapy comments  to anterior and posterior knee;completed  seperately from all other skilled interventions    Soft tissue mobilization  to reduce restrictions in knee    Myofascial Release  to reduce adhesions               PT Short Term Goals - 10/01/19 1530      PT SHORT TERM GOAL #1   Title  Patient will be independent in self management strategies to improve quality of life and functional outcomes.    Time  4    Period  Weeks    Status  On-going    Target Date  10/24/19      PT SHORT TERM GOAL #2   Title  Patient will report at least 50% improvement in overall symptoms and/or functional ability.    Time  4    Period  Weeks    Status  New    Target Date  10/24/19      PT SHORT TERM GOAL #3   Title  Patient will be able to ambulate at least 300 feet in 2 minutes to demonstrate improved ambulatory mobility.    Time  4    Period  Weeks    Status  On-going    Target Date  10/24/19        PT Long Term Goals - 10/01/19 1531      PT LONG TERM GOAL #1   Title  Patient will be able to ascend and descend stairs with reciprocal gait pattern to demonstrate improved stair mechanics and improve ability to go up/down stairs in her home.    Time  8    Period  Weeks    Status  On-going      PT LONG TERM GOAL #2   Title  Patient will be able to demonstrate at least 2-120 degrees of left knee ROM to demonstrate improved knee mobility    Time  8    Period  Weeks    Status  On-going      PT LONG TERM GOAL #3   Title  Patient will report at least 75% improvement in overall symptoms and/or functional ability.    Time  8    Period  Weeks  Status  On-going            Plan - 10/03/19 1535    Clinical Impression Statement  Less cramping reported today since completing new exercises and manual to knee last visit.  Pt has also been completing self manual as instructed by therapist as well.  Continued with primary focus on mobility with addition of bike for ROM and standing stretches.  Pain overall improved today and ROM increased  to 6-95 (was 8-80 last session).  Pt to increase her walking and activity at home as well.    Personal Factors and Comorbidities  Comorbidity 1;Comorbidity 2;Comorbidity 3+    Comorbidities  hx of anxiety, PTSD, macular degeneration, HTN, depression, crohn disease, LBP    Examination-Activity Limitations  Bend;Squat;Stairs;Stand;Transfers;Locomotion Level;Dressing;Sleep    Examination-Participation Restrictions  Cleaning;Community Activity;Yard Work;Meal Prep    Stability/Clinical Decision Making  Stable/Uncomplicated    Rehab Potential  Good    PT Frequency  Other (comment)   3x/week for 2 weeks then 2x/week for 6 weeks   PT Duration  8 weeks    PT Treatment/Interventions  ADLs/Self Care Home Management;Aquatic Therapy;Cryotherapy;Electrical Stimulation;Moist Heat;Traction;Balance training;Therapeutic exercise;Manual techniques;Therapeutic activities;Stair training;Gait training;DME Instruction;Neuromuscular re-education;Patient/family education;Dry needling    PT Next Visit Plan  patient prefers treatment in private treatment room secondary to anxiety. focus on walking mechanics and ROM initially and progress strength.  Next session begin standing heelraises, standing hamstring stretch and TKE's.    PT Home Exercise Plan  4/28, AAROM knee flexion EOB, calf stretch, continue HHPT exercises    Consulted and Agree with Plan of Care  Patient       Patient will benefit from skilled therapeutic intervention in order to improve the following deficits and impairments:  Decreased range of motion, Difficulty walking, Decreased mobility, Pain, Increased edema, Decreased scar mobility, Abnormal gait, Decreased endurance, Decreased skin integrity, Decreased activity tolerance, Decreased strength, Decreased balance  Visit Diagnosis: Chronic pain of left knee  Muscle weakness (generalized)     Problem List Patient Active Problem List   Diagnosis Date Noted  . Status post revision of total  replacement of left knee 09/10/2019  . 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 GERD (gastroesophageal reflux disease) 07/27/2018  . Constipation 07/27/2018  . Nonspecific abnormal electrocardiogram (ECG) (EKG) 07/27/2018  . Crohn's disease ([place]) 02/13/2018   Teena Irani, PTA/CLT [phone]  Teena Irani 10/03/2019, 3:47 PM  [facility] [address] [place], [place], [address] Phone: [phone]   Fax:  [phone]  Name: Kehlani Vancamp MRN: [id number] Date of Birth: 14-Dec-1965

## 2019-10-04 ENCOUNTER — Ambulatory Visit (HOSPITAL_COMMUNITY): Payer: Medicare HMO | Admitting: Physical Therapy

## 2019-10-05 DIAGNOSIS — M47816 Spondylosis without myelopathy or radiculopathy, lumbar region: Secondary | ICD-10-CM | POA: Diagnosis not present

## 2019-10-08 ENCOUNTER — Other Ambulatory Visit: Payer: Self-pay

## 2019-10-08 ENCOUNTER — Ambulatory Visit (HOSPITAL_COMMUNITY): Payer: Medicare HMO | Admitting: Physical Therapy

## 2019-10-08 DIAGNOSIS — G8929 Other chronic pain: Secondary | ICD-10-CM

## 2019-10-08 DIAGNOSIS — R262 Difficulty in walking, not elsewhere classified: Secondary | ICD-10-CM

## 2019-10-08 DIAGNOSIS — M6281 Muscle weakness (generalized): Secondary | ICD-10-CM

## 2019-10-08 DIAGNOSIS — M25562 Pain in left knee: Secondary | ICD-10-CM

## 2019-10-08 NOTE — Therapy (Signed)
[facility] [place], [place], [address] Phone: [phone]   Fax:  [phone]  Physical Therapy Treatment  Patient Details  Name: Victoria Deleon MRN: [id number] Date of Birth: 01/11/1966 Referring Provider (PT): Ulyses Southward   Encounter Date: 10/08/2019  PT End of Session - 10/08/19 1452    Visit Number  4    Number of Visits  18    Date for PT Re-Evaluation  11/21/19   eval 09/26/19   Authorization Type  [employer] H[employer]. MEd necessity, auth required    Progress Note Due on Visit  10    PT Start Time  1322    PT Stop Time  1405    PT Time Calculation (min)  43 min    Activity Tolerance  Patient tolerated treatment well    Behavior During Therapy  WFL for tasks assessed/performed       Past Medical History:  Diagnosis Date  . Anxiety   . Arthritis   . Crohn disease ([place])   . Depression   . History of kidney stones   . Hypertension   . Hypothyroidism   . Macular degeneration   . PTSD (post-traumatic stress disorder)     Past Surgical History:  Procedure Laterality Date  . ABDOMINAL HYSTERECTOMY    . APPENDECTOMY    . BIOPSY  04/24/2018   Procedure: BIOPSY;  Surgeon: Daneil Dolin, MD;  [facility];  Service: Endoscopy;;  (gastric/duodenal)  . CHOLECYSTECTOMY    . COLONOSCOPY WITH PROPOFOL N/A 04/24/2018   Dr. Gala Romney: Normal terminal ileum with negative biopsies, normal-appearing colon with random colon biopsies negative, internal hemorrhoids.  Next colonoscopy 10 years.  . ESOPHAGOGASTRODUODENOSCOPY (EGD) WITH PROPOFOL N/A 04/24/2018   Dr. Gala Romney: Severe ulcerative reflux esophagitis, small hiatal hernia, small bowel biopsies negative for celiac.  Marland Kitchen MEDIAL PARTIAL KNEE REPLACEMENT Left   . TOTAL KNEE REVISION Left 09/10/2019   Procedure: LEFT TOTAL KNEE REVISION;  Surgeon: Frederik Pear, MD;  [facility];  Service: Orthopedics;  Laterality: Left;    There were no vitals filed for this  visit.  Subjective Assessment - 10/08/19 1416    Subjective  Pt states it's just still very stiff.  Working on self manual techniques, therex and icing 3-4 times a day.  Sleeping is still hardest.  Currently with 7/10 pain.    Currently in Pain?  Yes    Pain Score  7     Pain Location  Abdomen    Pain Orientation  Left                       [facility] Adult PT Treatment/Exercise - 10/08/19 0001      Knee/Hip Exercises: Stretches   Active Hamstring Stretch  Left;3 reps;30 seconds    Active Hamstring Stretch Limitations  onto 10" step    Knee: Self-Stretch to increase Flexion  10 seconds;Left    Knee: Self-Stretch Limitations  onto 10" step 10 reps    Gastroc Stretch  Left;3 reps;30 seconds    Gastroc Stretch Limitations  slant board      Knee/Hip Exercises: Aerobic   Stationary Bike  at BOS rocking seat at 5       Knee/Hip Exercises: Standing   Terminal Knee Extension  Left;10 reps    Theraband Level (Terminal Knee Extension)  Level 3 (Green)      Knee/Hip Exercises: Seated   Other Seated Knee/Hip Exercises  flexion  with self stretch edge of mat 3X20"      Knee/Hip Exercises: Supine   Knee Extension  AROM    Knee Extension Limitations  4    Knee Flexion  AROM    Knee Flexion Limitations  98      Manual Therapy   Manual Therapy  Soft tissue mobilization;Myofascial release    Manual therapy comments  to anterior and posterior knee;completed seperately from all other skilled interventions    Soft tissue mobilization  to reduce restrictions in knee    Myofascial Release  to reduce adhesions               PT Short Term Goals - 10/08/19 1438      PT SHORT TERM GOAL #1   Title  Patient will be independent in self management strategies to improve quality of life and functional outcomes.    Time  4    Period  Weeks    Status  On-going    Target Date  10/24/19      PT SHORT TERM GOAL #2   Title  Patient will report at least 50% improvement in overall  symptoms and/or functional ability.    Time  4    Period  Weeks    Status  New    Target Date  10/24/19      PT SHORT TERM GOAL #3   Title  Patient will be able to ambulate at least 300 feet in 2 minutes to demonstrate improved ambulatory mobility.    Time  4    Period  Weeks    Status  On-going    Target Date  10/24/19        PT Long Term Goals - 10/01/19 1531      PT LONG TERM GOAL #1   Title  Patient will be able to ascend and descend stairs with reciprocal gait pattern to demonstrate improved stair mechanics and improve ability to go up/down stairs in her home.    Time  8    Period  Weeks    Status  On-going      PT LONG TERM GOAL #2   Title  Patient will be able to demonstrate at least 2-120 degrees of left knee ROM to demonstrate improved knee mobility    Time  8    Period  Weeks    Status  On-going      PT LONG TERM GOAL #3   Title  Patient will report at least 75% improvement in overall symptoms and/or functional ability.    Time  8    Period  Weeks    Status  On-going            Plan - 10/08/19 1438    Clinical Impression Statement  Continued with primary focus on ROM with bike for ROM/warmup and progression onto stretches and manual.   Added standing hamstirng stretch and TKE with green theraband without difficulty.   Cramping continues, however much less frequent or severe.Continues to have tightness perimeter of knee with most sensitivites along scar line and each end of scar.  ROM at end of session 4-98 (improved from last session 6-95).    Personal Factors and Comorbidities  Comorbidity 1;Comorbidity 2;Comorbidity 3+    Comorbidities  hx of anxiety, PTSD, macular degeneration, HTN, depression, crohn disease, LBP    Examination-Activity Limitations  Bend;Squat;Stairs;Stand;Transfers;Locomotion Level;Dressing;Sleep    Examination-Participation Restrictions  Cleaning;Community Activity;Yard Work;Meal Prep    Stability/Clinical Decision Making   Stable/Uncomplicated  Rehab Potential  Good    PT Frequency  Other (comment)   3x/week for 2 weeks then 2x/week for 6 weeks   PT Duration  8 weeks    PT Treatment/Interventions  ADLs/Self Care Home Management;Aquatic Therapy;Cryotherapy;Electrical Stimulation;Moist Heat;Traction;Balance training;Therapeutic exercise;Manual techniques;Therapeutic activities;Stair training;Gait training;DME Instruction;Neuromuscular re-education;Patient/family education;Dry needling    PT Next Visit Plan  patient prefers treatment in private treatment room secondary to anxiety. focus on walking mechanics and ROM initially and progress strength when ROM improves.    PT Home Exercise Plan  4/28, AAROM knee flexion EOB, calf stretch, continue HHPT exercises    Consulted and Agree with Plan of Care  Patient       Patient will benefit from skilled therapeutic intervention in order to improve the following deficits and impairments:  Decreased range of motion, Difficulty walking, Decreased mobility, Pain, Increased edema, Decreased scar mobility, Abnormal gait, Decreased endurance, Decreased skin integrity, Decreased activity tolerance, Decreased strength, Decreased balance  Visit Diagnosis: Chronic pain of left knee  Muscle weakness (generalized)  Difficulty in walking, not elsewhere classified     Problem List Patient Active Problem List   Diagnosis Date Noted  . 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 GERD (gastroesophageal reflux disease) 07/27/2018  . Constipation 07/27/2018  . Nonspecific abnormal electrocardiogram (ECG) (EKG) 07/27/2018  . Crohn's disease ([place]) 02/13/2018   Teena Irani, PTA/CLT [phone]  Teena Irani 10/08/2019, 2:53 PM  [facility] [address] [place], [place], [address] Phone: [phone]   Fax:  [phone]  Name: Victoria Deleon MRN: [id number] Date of Birth: August 19, 1965

## 2019-10-10 ENCOUNTER — Telehealth (HOSPITAL_COMMUNITY): Payer: Self-pay | Admitting: Physical Therapy

## 2019-10-10 ENCOUNTER — Ambulatory Visit (HOSPITAL_COMMUNITY): Payer: Medicare HMO | Admitting: Physical Therapy

## 2019-10-10 NOTE — Telephone Encounter (Signed)
pt called to cx today's appt she is not feeling well.

## 2019-10-11 ENCOUNTER — Encounter (HOSPITAL_COMMUNITY): Payer: Self-pay | Admitting: Physical Therapy

## 2019-10-11 ENCOUNTER — Ambulatory Visit (HOSPITAL_COMMUNITY): Payer: Medicare HMO | Admitting: Physical Therapy

## 2019-10-11 ENCOUNTER — Other Ambulatory Visit: Payer: Self-pay

## 2019-10-11 DIAGNOSIS — G8929 Other chronic pain: Secondary | ICD-10-CM

## 2019-10-11 DIAGNOSIS — R262 Difficulty in walking, not elsewhere classified: Secondary | ICD-10-CM

## 2019-10-11 DIAGNOSIS — M25562 Pain in left knee: Secondary | ICD-10-CM | POA: Diagnosis not present

## 2019-10-11 DIAGNOSIS — M6281 Muscle weakness (generalized): Secondary | ICD-10-CM

## 2019-10-11 NOTE — Therapy (Signed)
[place], [place], [address] Phone: [phone]   Fax:  [phone]  Physical Therapy Treatment  Patient Details  Name: Victoria Deleon MRN: [id number] Date of Birth: [birth year] Referring Provider (PT): Ulyses Southward   Encounter Date: 10/11/2019  PT End of Session - 10/11/19 1121    Visit Number  5    Number of Visits  18    Date for PT Re-Evaluation  11/21/19   eval 09/26/19   Authorization Type  [employer] H[employer]. MEd necessity, auth required    Progress Note Due on Visit  10    PT Start Time  1130    PT Stop Time  1210    PT Time Calculation (min)  40 min    Activity Tolerance  Patient tolerated treatment well    Behavior During Therapy  WFL for tasks assessed/performed       Past Medical History:  Diagnosis Date  . Anxiety   . Arthritis   . Crohn disease ([place])   . Depression   . History of kidney stones   . Hypertension   . Hypothyroidism   . Macular degeneration   . PTSD (post-traumatic stress disorder)     Past Surgical History:  Procedure Laterality Date  . ABDOMINAL HYSTERECTOMY    . APPENDECTOMY    . BIOPSY  04/24/2018   Procedure: BIOPSY;  Surgeon: Daneil Dolin, MD;  [facility];  Service: Endoscopy;;  (gastric/duodenal)  . CHOLECYSTECTOMY    . COLONOSCOPY WITH PROPOFOL N/A 04/24/2018   Dr. Gala Romney: Normal terminal ileum with negative biopsies, normal-appearing colon with random colon biopsies negative, internal hemorrhoids.  Next colonoscopy 10 years.  . ESOPHAGOGASTRODUODENOSCOPY (EGD) WITH PROPOFOL N/A 04/24/2018   Dr. Gala Romney: Severe ulcerative reflux esophagitis, small hiatal hernia, small bowel biopsies negative for celiac.  Marland Kitchen MEDIAL PARTIAL KNEE REPLACEMENT Left   . TOTAL KNEE REVISION Left 09/10/2019   Procedure: LEFT TOTAL KNEE REVISION;  Surgeon: Frederik Pear, MD;  [facility];  Service: Orthopedics;  Laterality: Left;    There were no vitals filed for this  visit.  Subjective Assessment - 10/11/19 1133    Subjective  States lots of crampy pain in the front and back of her knee. States that she thinks she overdid things with walking the last couple of days.    Currently in Pain?  Yes    Pain Score  6     Pain Orientation  Left    Pain Descriptors / Indicators  Aching;Cramping    Pain Type  Surgical pain         [facility] PT Assessment - 10/11/19 0001      Assessment   Medical Diagnosis  s/p Left TKA revision    Referring Provider (PT)  Ulyses Southward                    [facility] Adult PT Treatment/Exercise - 10/11/19 0001      Knee/Hip Exercises: Seated   Other Seated Knee/Hip Exercises  quad sets x15 5" holds       Knee/Hip Exercises: Supine   Heel Slides  AROM;Left   6 minutes with legs on wall L   Bridges  5 sets   could not tolerate due to cramping   Knee Extension  AROM    Knee Extension Limitations  14    Knee Flexion  AROM    Knee Flexion Limitations  91  Manual Therapy   Manual Therapy  Joint mobilization;Soft tissue mobilization    Manual therapy comments  all manual interventions performed independently of other interventions    Joint Mobilization  tib mobilizaitons L grade II/III with knee flexion  and extension     Soft tissue mobilization  to L posteiror knee              PT Education - 10/11/19 1156    Education Details  educated patient on importance of decreasing overall activity secondary to cramping, pain, reduction in ROM and poor tolerance to interventions. Answered all questions and instructed patient to elevate and perform more ROM exercises and less standing/walking.    Person(s) Educated  Patient    Methods  Explanation    Comprehension  Verbalized understanding       PT Short Term Goals - 10/08/19 1438      PT SHORT TERM GOAL #1   Title  Patient will be independent in self management strategies to improve quality of life and functional outcomes.    Time  4    Period  Weeks     Status  On-going    Target Date  10/24/19      PT SHORT TERM GOAL #2   Title  Patient will report at least 50% improvement in overall symptoms and/or functional ability.    Time  4    Period  Weeks    Status  New    Target Date  10/24/19      PT SHORT TERM GOAL #3   Title  Patient will be able to ambulate at least 300 feet in 2 minutes to demonstrate improved ambulatory mobility.    Time  4    Period  Weeks    Status  On-going    Target Date  10/24/19        PT Long Term Goals - 10/01/19 1531      PT LONG TERM GOAL #1   Title  Patient will be able to ascend and descend stairs with reciprocal gait pattern to demonstrate improved stair mechanics and improve ability to go up/down stairs in her home.    Time  8    Period  Weeks    Status  On-going      PT LONG TERM GOAL #2   Title  Patient will be able to demonstrate at least 2-120 degrees of left knee ROM to demonstrate improved knee mobility    Time  8    Period  Weeks    Status  On-going      PT LONG TERM GOAL #3   Title  Patient will report at least 75% improvement in overall symptoms and/or functional ability.    Time  8    Period  Weeks    Status  On-going            Plan - 10/11/19 1157    Clinical Impression Statement  Focused on education today secondary to patient demonstrating initial decrease in ROM, increase in cramping and decrease in tolerance to therapeutic interventions. Discussed decreased walking and standing as patient has been performing lots of standing and walking throughout the day despite increase in cramps, pain and decrease in sleep quality. Will follow up with frequency of cramping and pain next session.    Personal Factors and Comorbidities  Comorbidity 1;Comorbidity 2;Comorbidity 3+    Comorbidities  hx of anxiety, PTSD, macular degeneration, HTN, depression, crohn disease, LBP    Examination-Activity Limitations  Bend;Squat;Stairs;Stand;Transfers;Locomotion Level;Dressing;Sleep     Examination-Participation Restrictions  Cleaning;Community Activity;Yard Work;Meal Prep    Stability/Clinical Decision Making  Stable/Uncomplicated    Rehab Potential  Good    PT Frequency  Other (comment)   3x/week for 2 weeks then 2x/week for 6 weeks   PT Duration  8 weeks    PT Treatment/Interventions  ADLs/Self Care Home Management;Aquatic Therapy;Cryotherapy;Electrical Stimulation;Moist Heat;Traction;Balance training;Therapeutic exercise;Manual techniques;Therapeutic activities;Stair training;Gait training;DME Instruction;Neuromuscular re-education;Patient/family education;Dry needling    PT Next Visit Plan  patient prefers treatment in private treatment room secondary to anxiety. focus on walking mechanics and ROM initially and progress strength when ROM improves.    PT Home Exercise Plan  4/28, AAROM knee flexion EOB, calf stretch, continue HHPT exercises; 5/13 wall knee flexion    Consulted and Agree with Plan of Care  Patient       Patient will benefit from skilled therapeutic intervention in order to improve the following deficits and impairments:  Decreased range of motion, Difficulty walking, Decreased mobility, Pain, Increased edema, Decreased scar mobility, Abnormal gait, Decreased endurance, Decreased skin integrity, Decreased activity tolerance, Decreased strength, Decreased balance  Visit Diagnosis: Chronic pain of left knee  Muscle weakness (generalized)  Difficulty in walking, not elsewhere classified     Problem List Patient Active Problem List   Diagnosis Date Noted  . 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 GERD (gastroesophageal reflux disease) 07/27/2018  . Constipation 07/27/2018  . Nonspecific abnormal electrocardiogram (ECG) (EKG) 07/27/2018  . Crohn's disease ([place]) 02/13/2018    12:19 PM, 10/11/19 Jerene Pitch, DPT Physical Therapy with  [facility]  [phone] office  [place] [address] [place], [place], [address] Phone: [phone]   Fax:  [phone]  Name: Victoria Deleon MRN: [id number] Date of Birth: 1966/04/20

## 2019-10-15 ENCOUNTER — Other Ambulatory Visit: Payer: Self-pay

## 2019-10-15 ENCOUNTER — Encounter (HOSPITAL_COMMUNITY): Payer: Self-pay | Admitting: Physical Therapy

## 2019-10-15 ENCOUNTER — Telehealth: Payer: Self-pay | Admitting: Internal Medicine

## 2019-10-15 ENCOUNTER — Ambulatory Visit (HOSPITAL_COMMUNITY): Payer: Medicare HMO | Admitting: Physical Therapy

## 2019-10-15 DIAGNOSIS — M6281 Muscle weakness (generalized): Secondary | ICD-10-CM

## 2019-10-15 DIAGNOSIS — M25562 Pain in left knee: Secondary | ICD-10-CM

## 2019-10-15 DIAGNOSIS — G8929 Other chronic pain: Secondary | ICD-10-CM | POA: Diagnosis not present

## 2019-10-15 DIAGNOSIS — R262 Difficulty in walking, not elsewhere classified: Secondary | ICD-10-CM

## 2019-10-15 NOTE — Telephone Encounter (Signed)
Patient called asking if we have any linzess samples

## 2019-10-15 NOTE — Therapy (Signed)
[place], [place], [address] Phone: [phone]   Fax:  [phone]  Physical Therapy Treatment  Patient Details  Name: Victoria Deleon MRN: [id number] Date of Birth: January 23, 1966 Referring Provider (PT): Victoria Deleon   Encounter Date: 10/15/2019  PT End of Session - 10/15/19 1432    Visit Number  6    Number of Visits  18    Date for PT Re-Evaluation  11/21/19   eval 09/26/19   Authorization Type  [employer] H[employer]. MEd necessity, auth required    Progress Note Due on Visit  10    PT Start Time  1445    PT Stop Time  1525    PT Time Calculation (min)  40 min    Activity Tolerance  Patient tolerated treatment well    Behavior During Therapy  WFL for tasks assessed/performed       Past Medical History:  Diagnosis Date  . Anxiety   . Arthritis   . Crohn disease ([place])   . Depression   . History of kidney stones   . Hypertension   . Hypothyroidism   . Macular degeneration   . PTSD (post-traumatic stress disorder)     Past Surgical History:  Procedure Laterality Date  . ABDOMINAL HYSTERECTOMY    . APPENDECTOMY    . BIOPSY  04/24/2018   Procedure: BIOPSY;  Surgeon: Daneil Dolin, MD;  [facility];  Service: Endoscopy;;  (gastric/duodenal)  . CHOLECYSTECTOMY    . COLONOSCOPY WITH PROPOFOL N/A 04/24/2018   Dr. Gala Romney: Normal terminal ileum with negative biopsies, normal-appearing colon with random colon biopsies negative, internal hemorrhoids.  Next colonoscopy 10 years.  . ESOPHAGOGASTRODUODENOSCOPY (EGD) WITH PROPOFOL N/A 04/24/2018   Dr. Gala Romney: Severe ulcerative reflux esophagitis, small hiatal hernia, small bowel biopsies negative for celiac.  Marland Kitchen MEDIAL PARTIAL KNEE REPLACEMENT Left   . TOTAL KNEE REVISION Left 09/10/2019   Procedure: LEFT TOTAL KNEE REVISION;  Surgeon: Frederik Pear, MD;  [facility];  Service: Orthopedics;  Laterality: Left;    There were no vitals filed for this  visit.  Subjective Assessment - 10/15/19 1445    Subjective  States that she is having less cramping but she is resting more and her family is telling her to slow it down. Reports her current pain is 6/10 in the back.    Currently in Pain?  Yes    Pain Score  6     Pain Location  Knee    Pain Orientation  Left    Pain Descriptors / Indicators  Aching;Cramping         [facility] PT Assessment - 10/15/19 0001      Assessment   Medical Diagnosis  s/p Left TKA revision    Referring Provider (PT)  Victoria Deleon                    [facility] Adult PT Treatment/Exercise - 10/15/19 0001      Knee/Hip Exercises: Stretches   Gastroc Stretch  3 reps;Both;30 seconds    Other Knee/Hip Stretches  left knee flexion and extension stretch on 12" step x5 10" holds       Knee/Hip Exercises: Aerobic   Stationary Bike  at BOS rocking seat at 6, 6 minutes total        Knee/Hip Exercises: Standing   Forward Step Up  20 reps;Hand Hold: 1;Step Height: 4";Left    Other Standing Knee Exercises  forward reaches with feet together (modified/mini RDL) 5x5 5" holds     Other Standing Knee Exercises  semi single leg (staggered) squat L 3x10       Knee/Hip Exercises: Supine   Knee Extension  AROM   seated   Knee Extension Limitations  8   seated   Knee Flexion  AROM    Knee Flexion Limitations  94               PT Short Term Goals - 10/08/19 1438      PT SHORT TERM GOAL #1   Title  Patient will be independent in self management strategies to improve quality of life and functional outcomes.    Time  4    Period  Weeks    Status  On-going    Target Date  10/24/19      PT SHORT TERM GOAL #2   Title  Patient will report at least 50% improvement in overall symptoms and/or functional ability.    Time  4    Period  Weeks    Status  New    Target Date  10/24/19      PT SHORT TERM GOAL #3   Title  Patient will be able to ambulate at least 300 feet in 2 minutes to demonstrate improved  ambulatory mobility.    Time  4    Period  Weeks    Status  On-going    Target Date  10/24/19        PT Long Term Goals - 10/01/19 1531      PT LONG TERM GOAL #1   Title  Patient will be able to ascend and descend stairs with reciprocal gait pattern to demonstrate improved stair mechanics and improve ability to go up/down stairs in her home.    Time  8    Period  Weeks    Status  On-going      PT LONG TERM GOAL #2   Title  Patient will be able to demonstrate at least 2-120 degrees of left knee ROM to demonstrate improved knee mobility    Time  8    Period  Weeks    Status  On-going      PT LONG TERM GOAL #3   Title  Patient will report at least 75% improvement in overall symptoms and/or functional ability.    Time  8    Period  Weeks    Status  On-going            Plan - 10/15/19 1524    Clinical Impression Statement  Focused on mobility and posterior LE muscle strengthening to decrease cramping and improve tolerance to activity and knee ROM. Improved knee ROM noted today (measured in seated position), less cramping noted during today's session. Minor discomfort noted end of session but no severe pain.    Personal Factors and Comorbidities  Comorbidity 1;Comorbidity 2;Comorbidity 3+    Comorbidities  hx of anxiety, PTSD, macular degeneration, HTN, depression, crohn disease, LBP    Examination-Activity Limitations  Bend;Squat;Stairs;Stand;Transfers;Locomotion Level;Dressing;Sleep    Examination-Participation Restrictions  Cleaning;Community Activity;Yard Work;Meal Prep    Stability/Clinical Decision Making  Stable/Uncomplicated    Rehab Potential  Good    PT Frequency  Other (comment)   3x/week for 2 weeks then 2x/week for 6 weeks   PT Duration  8 weeks    PT Treatment/Interventions  ADLs/Self Care Home Management;Aquatic Therapy;Cryotherapy;Electrical Stimulation;Moist Heat;Traction;Balance training;Therapeutic exercise;Manual techniques;Therapeutic activities;Stair  training;Gait training;DME Instruction;Neuromuscular re-education;Patient/family education;Dry  needling    PT Next Visit Plan  patient prefers treatment in private treatment room secondary to anxiety. focus on walking mechanics and ROM initially and progress strength when ROM improves.    PT Home Exercise Plan  4/28, AAROM knee flexion EOB, calf stretch, continue HHPT exercises; 5/13 wall knee flexion    Consulted and Agree with Plan of Care  Patient       Patient will benefit from skilled therapeutic intervention in order to improve the following deficits and impairments:  Decreased range of motion, Difficulty walking, Decreased mobility, Pain, Increased edema, Decreased scar mobility, Abnormal gait, Decreased endurance, Decreased skin integrity, Decreased activity tolerance, Decreased strength, Decreased balance  Visit Diagnosis: Chronic pain of left knee  Muscle weakness (generalized)  Difficulty in walking, not elsewhere classified     Problem List Patient Active Problem List   Diagnosis Date Noted  . 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 GERD (gastroesophageal reflux disease) 07/27/2018  . Constipation 07/27/2018  . Nonspecific abnormal electrocardiogram (ECG) (EKG) 07/27/2018  . Crohn's disease ([place]) 02/13/2018   3:27 PM, 10/15/19 Jerene Pitch, DPT Physical Therapy with [facility]  [phone] office  [place] [address] [place], [place], [address] Phone: [phone]   Fax:  [phone]  Name: Victoria Deleon MRN: [id number] Date of Birth: November 03, 1965

## 2019-10-15 NOTE — Telephone Encounter (Signed)
Spoke with pt. Linzess 72 mcg samples are ready for pickup.

## 2019-10-16 DIAGNOSIS — H5200 Hypermetropia, unspecified eye: Secondary | ICD-10-CM | POA: Diagnosis not present

## 2019-10-17 ENCOUNTER — Ambulatory Visit (HOSPITAL_COMMUNITY): Payer: Medicare HMO | Admitting: Physical Therapy

## 2019-10-17 ENCOUNTER — Other Ambulatory Visit: Payer: Self-pay

## 2019-10-17 DIAGNOSIS — M6281 Muscle weakness (generalized): Secondary | ICD-10-CM | POA: Diagnosis not present

## 2019-10-17 DIAGNOSIS — G8929 Other chronic pain: Secondary | ICD-10-CM

## 2019-10-17 DIAGNOSIS — R262 Difficulty in walking, not elsewhere classified: Secondary | ICD-10-CM

## 2019-10-17 DIAGNOSIS — M25562 Pain in left knee: Secondary | ICD-10-CM

## 2019-10-17 NOTE — Therapy (Signed)
[place], [place], [address] Phone: [phone]   Fax:  [phone]  Physical Therapy Treatment  Patient Details  Name: Victoria Deleon MRN: [id number] Date of Birth: [birth year] Referring Provider (PT): Ulyses Southward   Encounter Date: 10/17/2019  PT End of Session - 10/17/19 1517    Visit Number  7    Number of Visits  18    Date for PT Re-Evaluation  11/21/19   eval 09/26/19   Authorization Type  [employer] HMO. MEd necessity, auth required    Progress Note Due on Visit  10    PT Start Time  1446    PT Stop Time  1530    PT Time Calculation (min)  44 min    Activity Tolerance  Patient tolerated treatment well    Behavior During Therapy  WFL for tasks assessed/performed       Past Medical History:  Diagnosis Date  . Anxiety   . Arthritis   . Crohn disease ([place])   . Depression   . History of kidney stones   . Hypertension   . Hypothyroidism   . Macular degeneration   . PTSD (post-traumatic stress disorder)     Past Surgical History:  Procedure Laterality Date  . ABDOMINAL HYSTERECTOMY    . APPENDECTOMY    . BIOPSY  04/24/2018   Procedure: BIOPSY;  Surgeon: Daneil Dolin, MD;  [facility];  Service: Endoscopy;;  (gastric/duodenal)  . CHOLECYSTECTOMY    . COLONOSCOPY WITH PROPOFOL N/A 04/24/2018   Dr. Gala Romney: Normal terminal ileum with negative biopsies, normal-appearing colon with random colon biopsies negative, internal hemorrhoids.  Next colonoscopy 10 years.  . ESOPHAGOGASTRODUODENOSCOPY (EGD) WITH PROPOFOL N/A 04/24/2018   Dr. Gala Romney: Severe ulcerative reflux esophagitis, small hiatal hernia, small bowel biopsies negative for celiac.  Marland Kitchen MEDIAL PARTIAL KNEE REPLACEMENT Left   . TOTAL KNEE REVISION Left 09/10/2019   Procedure: LEFT TOTAL KNEE REVISION;  Surgeon: Frederik Pear, MD;  [facility];  Service: Orthopedics;  Laterality: Left;    There were no vitals filed for this  visit.  Subjective Assessment - 10/17/19 1502    Subjective  pt states she is not cramping as much.  Currently6/10    Currently in Pain?  Yes    Pain Score  6     Pain Location  Knee    Pain Orientation  Left    Pain Descriptors / Indicators  Aching;Cramping    Pain Type  Surgical pain                        [facility] Adult PT Treatment/Exercise - 10/17/19 0001      Knee/Hip Exercises: Stretches   Active Hamstring Stretch  Left;3 reps;30 seconds    Active Hamstring Stretch Limitations  onto 12" step    Knee: Self-Stretch to increase Flexion  10 seconds;Left    Knee: Self-Stretch Limitations  onto 12" step 10 reps    Gastroc Stretch  3 reps;Both;30 seconds      Knee/Hip Exercises: Aerobic   Stationary Bike  at BOS rocking seat at 6, 6 minutes total        Knee/Hip Exercises: Standing   Knee Flexion  Left;10 reps    SLS  each LE 3 trials max of 20"    Other Standing Knee Exercises  tandem stance 30" each LE lead      Knee/Hip Exercises: Supine  Quad Sets  Left;10 reps    [employer] Limitations  5 second hold    Heel Slides  AROM;Left    Knee Extension  AROM    Knee Extension Limitations  8    Knee Flexion  AROM    Knee Flexion Limitations  92      Manual Therapy   Manual Therapy  Myofascial release    Manual therapy comments  all manual interventions performed independently of other interventions    Myofascial Release  to reduce adhesions               PT Short Term Goals - 10/08/19 1438      PT SHORT TERM GOAL #1   Title  Patient will be independent in self management strategies to improve quality of life and functional outcomes.    Time  4    Period  Weeks    Status  On-going    Target Date  10/24/19      PT SHORT TERM GOAL #2   Title  Patient will report at least 50% improvement in overall symptoms and/or functional ability.    Time  4    Period  Weeks    Status  New    Target Date  10/24/19      PT SHORT TERM GOAL #3   Title   Patient will be able to ambulate at least 300 feet in 2 minutes to demonstrate improved ambulatory mobility.    Time  4    Period  Weeks    Status  On-going    Target Date  10/24/19        PT Long Term Goals - 10/01/19 1531      PT LONG TERM GOAL #1   Title  Patient will be able to ascend and descend stairs with reciprocal gait pattern to demonstrate improved stair mechanics and improve ability to go up/down stairs in her home.    Time  8    Period  Weeks    Status  On-going      PT LONG TERM GOAL #2   Title  Patient will be able to demonstrate at least 2-120 degrees of left knee ROM to demonstrate improved knee mobility    Time  8    Period  Weeks    Status  On-going      PT LONG TERM GOAL #3   Title  Patient will report at least 75% improvement in overall symptoms and/or functional ability.    Time  8    Period  Weeks    Status  On-going            Plan - 10/17/19 1614    Clinical Impression Statement  continued with primary focus on mobilty.  Began standing knee flexion to improve hamstring strength and promote increased flexion/reduced cramping.  Able to complete this without difficulty.  Lt  SLS time of 20 seconds and also added tandem stance with abiltiy to maintain full 30" either LE without LOB or UE assistance.  manual at end of session reveals tightn adhesions perimeter of knee.  Only able to get to 92 degrees of flexion with extension holding at lacking 8 from neutral.    Personal Factors and Comorbidities  Comorbidity 1;Comorbidity 2;Comorbidity 3+    Comorbidities  hx of anxiety, PTSD, macular degeneration, HTN, depression, crohn disease, LBP    Examination-Activity Limitations  Bend;Squat;Stairs;Stand;Transfers;Locomotion Level;Dressing;Sleep    Examination-Participation Restrictions  Cleaning;Community Activity;Yard Work;Meal Prep  Stability/Clinical Decision Making  Stable/Uncomplicated    Rehab Potential  Good    PT Frequency  Other (comment)   3x/week  for 2 weeks then 2x/week for 6 weeks   PT Duration  8 weeks    PT Treatment/Interventions  ADLs/Self Care Home Management;Aquatic Therapy;Cryotherapy;Electrical Stimulation;Moist Heat;Traction;Balance training;Therapeutic exercise;Manual techniques;Therapeutic activities;Stair training;Gait training;DME Instruction;Neuromuscular re-education;Patient/family education;Dry needling    PT Next Visit Plan  patient prefers treatment in private treatment room secondary to anxiety. focus on walking mechanics and ROM initially and progress strength when ROM improves.    PT Home Exercise Plan  4/28, AAROM knee flexion EOB, calf stretch, continue HHPT exercises; 5/13 wall knee flexion    Consulted and Agree with Plan of Care  Patient       Patient will benefit from skilled therapeutic intervention in order to improve the following deficits and impairments:  Decreased range of motion, Difficulty walking, Decreased mobility, Pain, Increased edema, Decreased scar mobility, Abnormal gait, Decreased endurance, Decreased skin integrity, Decreased activity tolerance, Decreased strength, Decreased balance  Visit Diagnosis: Chronic pain of left knee  Muscle weakness (generalized)  Difficulty in walking, not elsewhere classified     Problem List Patient Active Problem List   Diagnosis Date Noted  . 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 GERD (gastroesophageal reflux disease) 07/27/2018  . Constipation 07/27/2018  . Nonspecific abnormal electrocardiogram (ECG) (EKG) 07/27/2018  . Crohn's disease ([place]) 02/13/2018   Teena Irani, PTA/CLT [phone]  Teena Irani 10/17/2019, 4:17 PM  [place] [address] [place], [place], [address] Phone: [phone]   Fax:  [phone]  Name: Jadasia Haws MRN: [id number] Date of Birth: May 18, 1966

## 2019-10-18 DIAGNOSIS — Z96652 Presence of left artificial knee joint: Secondary | ICD-10-CM | POA: Diagnosis not present

## 2019-10-22 ENCOUNTER — Other Ambulatory Visit: Payer: Self-pay

## 2019-10-22 ENCOUNTER — Ambulatory Visit (HOSPITAL_COMMUNITY): Payer: Medicare HMO | Admitting: Physical Therapy

## 2019-10-22 ENCOUNTER — Encounter (HOSPITAL_COMMUNITY): Payer: Self-pay | Admitting: Physical Therapy

## 2019-10-22 DIAGNOSIS — M6281 Muscle weakness (generalized): Secondary | ICD-10-CM

## 2019-10-22 DIAGNOSIS — R262 Difficulty in walking, not elsewhere classified: Secondary | ICD-10-CM

## 2019-10-22 DIAGNOSIS — M25562 Pain in left knee: Secondary | ICD-10-CM | POA: Diagnosis not present

## 2019-10-22 DIAGNOSIS — G8929 Other chronic pain: Secondary | ICD-10-CM | POA: Diagnosis not present

## 2019-10-22 NOTE — Therapy (Signed)
[place], [place], [address] Phone: [phone]   Fax:  [phone]  Physical Therapy Treatment  Patient Details  Name: Victoria Deleon MRN: [id number] Date of Birth: July 03, 1965 Referring Provider (PT): Ulyses Southward   Encounter Date: 10/22/2019  PT End of Session - 10/22/19 1316    Visit Number  8    Number of Visits  18    Date for PT Re-Evaluation  11/21/19   eval 09/26/19   Authorization Type  [employer]. MEd necessity, auth required    Progress Note Due on Visit  10    PT Start Time  1316    PT Stop Time  1400    PT Time Calculation (min)  44 min    Activity Tolerance  Patient tolerated treatment well    Behavior During Therapy  WFL for tasks assessed/performed       Past Medical History:  Diagnosis Date  . Anxiety   . Arthritis   . Crohn disease ([place])   . Depression   . History of kidney stones   . Hypertension   . Hypothyroidism   . Macular degeneration   . PTSD (post-traumatic stress disorder)     Past Surgical History:  Procedure Laterality Date  . ABDOMINAL HYSTERECTOMY    . APPENDECTOMY    . BIOPSY  04/24/2018   Procedure: BIOPSY;  Surgeon: Daneil Dolin, MD;  [facility];  Service: Endoscopy;;  (gastric/duodenal)  . CHOLECYSTECTOMY    . COLONOSCOPY WITH PROPOFOL N/A 04/24/2018   Dr. Gala Romney: Normal terminal ileum with negative biopsies, normal-appearing colon with random colon biopsies negative, internal hemorrhoids.  Next colonoscopy 10 years.  . ESOPHAGOGASTRODUODENOSCOPY (EGD) WITH PROPOFOL N/A 04/24/2018   Dr. Gala Romney: Severe ulcerative reflux esophagitis, small hiatal hernia, small bowel biopsies negative for celiac.  Marland Kitchen MEDIAL PARTIAL KNEE REPLACEMENT Left   . TOTAL KNEE REVISION Left 09/10/2019   Procedure: LEFT TOTAL KNEE REVISION;  Surgeon: Frederik Pear, MD;  Location: [facility];  Service: Orthopedics;  Laterality: Left;    There were no vitals filed for this  visit.  Subjective Assessment - 10/22/19 1321    Subjective  Saw MD and they want more motion. States she is still cramping but it is better. States she stopped using cane over the weekend but still uses it on uneven ground. Current pain 5/10 along medial knee.    Currently in Pain?  Yes    Pain Location  Knee    Pain Orientation  Left    Pain Descriptors / Indicators  Aching;Cramping    Pain Type  Surgical pain         [facility] PT Assessment - 10/22/19 0001      Assessment   Medical Diagnosis  s/p Left TKA revision    Referring Provider (PT)  Ulyses Southward                    [facility] Adult PT Treatment/Exercise - 10/22/19 0001      Knee/Hip Exercises: Machines for Strengthening   Total Gym Leg Press  position 24 DL leg press 2x15      Knee/Hip Exercises: Standing   Other Standing Knee Exercises  knee flexion and extension L on step x10 10" holds Each       Knee/Hip Exercises: Supine   Knee Extension  AROM    Knee Extension Limitations  5   lacking.    Knee Flexion  AROM  Knee Flexion Limitations  98    Other Supine Knee/Hip Exercises  knee flexion holds on total gym position 18, R leg did for motion. - 58mnutes total       Knee/Hip Exercises: Prone   Hamstring Curl  3 sets;10 reps;5 seconds   L   Prone Knee Hang  1 minute   x5 L   Other Prone Exercises  contract relax into flexion - x15 5" holds     Other Prone Exercises  quad isometric 2x10 5" holds L      Manual Therapy   Manual Therapy  Joint mobilization    Manual therapy comments  all manual interventions performed independently of other interventions    Joint Mobilization  tib mobilizaitons L grade II/III with knee flexion  and extension                PT Short Term Goals - 10/08/19 1438      PT SHORT TERM GOAL #1   Title  Patient will be independent in self management strategies to improve quality of life and functional outcomes.    Time  4    Period  Weeks    Status  On-going    Target  Date  10/24/19      PT SHORT TERM GOAL #2   Title  Patient will report at least 50% improvement in overall symptoms and/or functional ability.    Time  4    Period  Weeks    Status  New    Target Date  10/24/19      PT SHORT TERM GOAL #3   Title  Patient will be able to ambulate at least 300 feet in 2 minutes to demonstrate improved ambulatory mobility.    Time  4    Period  Weeks    Status  On-going    Target Date  10/24/19        PT Long Term Goals - 10/01/19 1531      PT LONG TERM GOAL #1   Title  Patient will be able to ascend and descend stairs with reciprocal gait pattern to demonstrate improved stair mechanics and improve ability to go up/down stairs in her home.    Time  8    Period  Weeks    Status  On-going      PT LONG TERM GOAL #2   Title  Patient will be able to demonstrate at least 2-120 degrees of left knee ROM to demonstrate improved knee mobility    Time  8    Period  Weeks    Status  On-going      PT LONG TERM GOAL #3   Title  Patient will report at least 75% improvement in overall symptoms and/or functional ability.    Time  8    Period  Weeks    Status  On-going            Plan - 10/22/19 1321    Clinical Impression Statement  Continued to focus on knee ROM on this date. Less cramping noted during session and prone exercises tolerated best for knee bending, added these to HEP. Able to achieve 98 degrees of knee flexion actively and 100 passively. Will continue to focus on knee ROM as tolerated and continue joint mobilizations and STM as needed.    Personal Factors and Comorbidities  Comorbidity 1;Comorbidity 2;Comorbidity 3+    Comorbidities  hx of anxiety, PTSD, macular degeneration, HTN, depression, crohn disease, LBP  Examination-Activity Limitations  Bend;Squat;Stairs;Stand;Transfers;Locomotion Level;Dressing;Sleep    Examination-Participation Restrictions  Cleaning;Community Activity;Yard Work;Meal Prep    Stability/Clinical Decision  Making  Stable/Uncomplicated    Rehab Potential  Good    PT Frequency  Other (comment)   3x/week for 2 weeks then 2x/week for 6 weeks   PT Duration  8 weeks    PT Treatment/Interventions  ADLs/Self Care Home Management;Aquatic Therapy;Cryotherapy;Electrical Stimulation;Moist Heat;Traction;Balance training;Therapeutic exercise;Manual techniques;Therapeutic activities;Stair training;Gait training;DME Instruction;Neuromuscular re-education;Patient/family education;Dry needling    PT Next Visit Plan  thomas stretch, knee ROM . focus on walking mechanics and ROM initially and progress strength when ROM improves.    PT Home Exercise Plan  4/28, AAROM knee flexion EOB, calf stretch, continue HHPT exercises; 5/13 wall knee flexion; 5/24 prone knee bends, quad isometrics, knee hang    Consulted and Agree with Plan of Care  Patient       Patient will benefit from skilled therapeutic intervention in order to improve the following deficits and impairments:  Decreased range of motion, Difficulty walking, Decreased mobility, Pain, Increased edema, Decreased scar mobility, Abnormal gait, Decreased endurance, Decreased skin integrity, Decreased activity tolerance, Decreased strength, Decreased balance  Visit Diagnosis: Chronic pain of left knee  Muscle weakness (generalized)  Difficulty in walking, not elsewhere classified     Problem List Patient Active Problem List   Diagnosis Date Noted  . 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 Pain due to unicompartmental arthroplasty of knee ([place]) 09/06/2019  . GERD (gastroesophageal reflux disease) 07/27/2018  . Constipation 07/27/2018  . Nonspecific abnormal electrocardiogram (ECG) (EKG) 07/27/2018  . Crohn's disease ([place]) 02/13/2018    2:30 PM, 10/22/19 Jerene Pitch, DPT Physical Therapy with [facility]  [phone] office  [place], [place], [address] Phone: [phone]   Fax:  [phone]  Name: Victoria Deleon MRN: [id number] Date of Birth: 07/16/1965

## 2019-10-24 ENCOUNTER — Ambulatory Visit (HOSPITAL_COMMUNITY): Payer: Medicare HMO | Admitting: Physical Therapy

## 2019-10-24 ENCOUNTER — Encounter (HOSPITAL_COMMUNITY): Payer: Self-pay | Admitting: Physical Therapy

## 2019-10-24 ENCOUNTER — Other Ambulatory Visit: Payer: Self-pay

## 2019-10-24 DIAGNOSIS — R262 Difficulty in walking, not elsewhere classified: Secondary | ICD-10-CM

## 2019-10-24 DIAGNOSIS — M25562 Pain in left knee: Secondary | ICD-10-CM | POA: Diagnosis not present

## 2019-10-24 DIAGNOSIS — G8929 Other chronic pain: Secondary | ICD-10-CM

## 2019-10-24 DIAGNOSIS — M6281 Muscle weakness (generalized): Secondary | ICD-10-CM

## 2019-10-24 NOTE — Therapy (Signed)
[place] [address] [place], [place], [address] Phone: [phone]   Fax:  [phone]  Physical Therapy Treatment and Progress Note  Patient Details  Name: Victoria Deleon MRN: [id number] Date of Birth: [birth year] Referring Provider (PT): Ulyses Southward  Progress Note Reporting Period 09/26/19 to 10/24/19  See note below for Objective Data and Assessment of Progress/Goals.       Encounter Date: 10/24/2019  PT End of Session - 10/24/19 1317    Visit Number  9    Number of Visits  18    Date for PT Re-Evaluation  11/21/19   eval 09/26/19   Authorization Type  [employer] H[employer]. MEd necessity, auth required    Progress Note Due on Visit  19    PT Start Time  1316    PT Stop Time  1356    PT Time Calculation (min)  40 min    Activity Tolerance  Patient tolerated treatment well    Behavior During Therapy  WFL for tasks assessed/performed       Past Medical History:  Diagnosis Date  . Anxiety   . Arthritis   . Crohn disease ([place])   . Depression   . History of kidney stones   . Hypertension   . Hypothyroidism   . Macular degeneration   . PTSD (post-traumatic stress disorder)     Past Surgical History:  Procedure Laterality Date  . ABDOMINAL HYSTERECTOMY    . APPENDECTOMY    . BIOPSY  04/24/2018   Procedure: BIOPSY;  Surgeon: Daneil Dolin, MD;  [facility];  Service: Endoscopy;;  (gastric/duodenal)  . CHOLECYSTECTOMY    . COLONOSCOPY WITH PROPOFOL N/A 04/24/2018   Dr. Gala Romney: Normal terminal ileum with negative biopsies, normal-appearing colon with random colon biopsies negative, internal hemorrhoids.  Next colonoscopy 10 years.  . ESOPHAGOGASTRODUODENOSCOPY (EGD) WITH PROPOFOL N/A 04/24/2018   Dr. Gala Romney: Severe ulcerative reflux esophagitis, small hiatal hernia, small bowel biopsies negative for celiac.  Marland Kitchen MEDIAL PARTIAL KNEE REPLACEMENT Left   . TOTAL KNEE REVISION Left 09/10/2019   Procedure: LEFT  TOTAL KNEE REVISION;  Surgeon: Frederik Pear, MD;  [facility];  Service: Orthopedics;  Laterality: Left;    There were no vitals filed for this visit.  Subjective Assessment - 10/24/19 1320    Subjective  States her pain is 5/10. States that her cramping is better but it is on the top part of her knee more now. States she feels about 30-40% better since the start of PT.    Currently in Pain?  Yes    Pain Score  5     Pain Location  Knee    Pain Orientation  Left;Lateral;Medial         [facility] PT Assessment - 10/24/19 0001      Assessment   Medical Diagnosis  s/p Left TKA revision    Referring Provider (PT)  Ulyses Southward      Observation/Other Assessments   Observations  redness surrounding incision about .5 cm extending medially and .5 cm extending laterally - no tenderness to touch, warmth noted around knee - left side    Focus on Therapeutic Outcomes (FOTO)   57% limited   was 68% limited     Observation/Other Assessments-Edema    Edema  Circumferential      Circumferential Edema   Circumferential - Right  37cm     Circumferential - Left   43cm at joint line  Strength   Strength Assessment Site  Knee;Ankle    Right/Left Knee  Left;Right    Right Knee Flexion  4/5    Right Knee Extension  5/5    Left Knee Flexion  4/5    Left Knee Extension  4+/5    Right/Left Ankle  Right;Left    Right Ankle Dorsiflexion  4+/5    Left Ankle Dorsiflexion  4+/5      Ambulation/Gait   Ambulation/Gait  Yes    Ambulation/Gait Assistance  6: Modified independent (Device/Increase time)    Ambulation Distance (Feet)  426 Feet    Assistive device  None    Gait Pattern  Decreased stance time - left;Decreased hip/knee flexion - left;Left foot flat    Ambulation Surface  Level;Indoor    Gait Comments  2MW                    [facility] Adult PT Treatment/Exercise - 10/24/19 0001      Knee/Hip Exercises: Supine   Heel Slides  AROM;Left;15 reps   10" holds    Knee  Extension  AROM    Knee Extension Limitations  5    Knee Flexion  AROM    Knee Flexion Limitations  92      Manual Therapy   Manual Therapy  Joint mobilization;Soft tissue mobilization    Manual therapy comments  all manual interventions performed independently of other interventions    Joint Mobilization  left patellar mobilizations in all directions tolerated wel    Soft tissue mobilization  STM to distal quad and edemea mobilizaition to left knee              PT Education - 10/24/19 1340    Education Details  educated patient on signs and symptoms of systemic infection and what to do. Educated patient in incision monitoring and if redness continues to grow what to do. compression stockings - educated pateitn on using during day.    Person(s) Educated  Patient    Methods  Explanation    Comprehension  Verbalized understanding       PT Short Term Goals - 10/24/19 1324      PT SHORT TERM GOAL #1   Title  Patient will be independent in self management strategies to improve quality of life and functional outcomes.    Time  4    Period  Weeks    Status  On-going    Target Date  10/24/19      PT SHORT TERM GOAL #2   Title  Patient will report at least 50% improvement in overall symptoms and/or functional ability.    Baseline  30-40% better    Time  4    Period  Weeks    Status  On-going    Target Date  10/24/19      PT SHORT TERM GOAL #3   Title  Patient will be able to ambulate at least 300 feet in 2 minutes to demonstrate improved ambulatory mobility.    Baseline  426 ft    Time  4    Period  Weeks    Status  Achieved    Target Date  10/24/19        PT Long Term Goals - 10/24/19 1324      PT LONG TERM GOAL #1   Title  Patient will be able to ascend and descend stairs with reciprocal gait pattern to demonstrate improved stair mechanics and improve ability to go up/down  stairs in her home.    Time  8    Period  Weeks    Status  On-going      PT LONG TERM  GOAL #2   Title  Patient will be able to demonstrate at least 2-120 degrees of left knee ROM to demonstrate improved knee mobility    Time  8    Period  Weeks    Status  On-going      PT LONG TERM GOAL #3   Title  Patient will report at least 75% improvement in overall symptoms and/or functional ability.    Baseline  30-40% better    Time  8    Period  Weeks    Status  On-going            Plan - 10/24/19 1317    Clinical Impression Statement  Patient present for a re-evaluation on this date. She has made good progress in functional mobility but continues to have pain, swelling, and ROM limitations in her left knee. 1/3 short term goals met at this time and no long term goals met currently. Educated patient on importance of not over doing things (manages garden and chickens at home and has tendency to do too much). Educated patient on importance of elevation, use of compression stocking and rest to help manage inflammation. Redness noted along incision/scar site today which is new, measured and educated patient on monitoring redness and what to do if redness continues to grow around incision/scar site. Patient would continue to benefit from skilled physical therapy to improve knee ROM and decrease pain/inflammation.    Personal Factors and Comorbidities  Comorbidity 1;Comorbidity 2;Comorbidity 3+    Comorbidities  hx of anxiety, PTSD, macular degeneration, HTN, depression, crohn disease, LBP    Examination-Activity Limitations  Bend;Squat;Stairs;Stand;Transfers;Locomotion Level;Dressing;Sleep    Examination-Participation Restrictions  Cleaning;Community Activity;Yard Work;Meal Prep    Stability/Clinical Decision Making  Stable/Uncomplicated    Rehab Potential  Good    PT Frequency  Other (comment)   3x/week for 2 weeks then 2x/week for 6 weeks   PT Duration  8 weeks    PT Treatment/Interventions  ADLs/Self Care Home Management;Aquatic Therapy;Cryotherapy;Electrical Stimulation;Moist  Heat;Traction;Balance training;Therapeutic exercise;Manual techniques;Therapeutic activities;Stair training;Gait training;DME Instruction;Neuromuscular re-education;Patient/family education;Dry needling    PT Next Visit Plan  thomas stretch, knee ROM . focus on walking mechanics and ROM initially and progress strength when ROM improves. f/u with compression use    PT Home Exercise Plan  4/28, AAROM knee flexion EOB, calf stretch, continue HHPT exercises; 5/13 wall knee flexion; 5/24 prone knee bends, quad isometrics, knee hang    Consulted and Agree with Plan of Care  Patient       Patient will benefit from skilled therapeutic intervention in order to improve the following deficits and impairments:  Decreased range of motion, Difficulty walking, Decreased mobility, Pain, Increased edema, Decreased scar mobility, Abnormal gait, Decreased endurance, Decreased skin integrity, Decreased activity tolerance, Decreased strength, Decreased balance  Visit Diagnosis: Chronic pain of left knee  Muscle weakness (generalized)  Difficulty in walking, not elsewhere classified     Problem List Patient Active Problem List   Diagnosis Date Noted  . 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 GERD (gastroesophageal reflux disease) 07/27/2018  . Constipation 07/27/2018  . Nonspecific abnormal electrocardiogram (ECG) (EKG) 07/27/2018  . Crohn's disease ([place]) 02/13/2018    2:04 PM, 10/24/19 Jerene Pitch, DPT Physical  Therapy with [facility]  [phone] office   [place] [address] [place], [place], [address] Phone: [phone]   Fax:  [phone]  Name: Victoria Deleon MRN: [id number] Date of Birth: 1965-10-11

## 2019-10-30 ENCOUNTER — Telehealth (HOSPITAL_COMMUNITY): Payer: Self-pay | Admitting: Physical Therapy

## 2019-10-30 ENCOUNTER — Encounter (HOSPITAL_COMMUNITY): Payer: Medicare HMO | Admitting: Physical Therapy

## 2019-10-30 NOTE — Telephone Encounter (Signed)
pt cancelled these two appts due to dental work and she is swollen

## 2019-11-01 ENCOUNTER — Encounter (HOSPITAL_COMMUNITY): Payer: Medicare HMO | Admitting: Physical Therapy

## 2019-11-06 ENCOUNTER — Encounter (HOSPITAL_COMMUNITY): Payer: Self-pay | Admitting: Physical Therapy

## 2019-11-06 ENCOUNTER — Ambulatory Visit (HOSPITAL_COMMUNITY): Payer: Medicare HMO | Attending: Orthopedic Surgery | Admitting: Physical Therapy

## 2019-11-06 ENCOUNTER — Other Ambulatory Visit: Payer: Self-pay

## 2019-11-06 DIAGNOSIS — M25562 Pain in left knee: Secondary | ICD-10-CM | POA: Insufficient documentation

## 2019-11-06 DIAGNOSIS — R262 Difficulty in walking, not elsewhere classified: Secondary | ICD-10-CM | POA: Insufficient documentation

## 2019-11-06 DIAGNOSIS — G8929 Other chronic pain: Secondary | ICD-10-CM | POA: Insufficient documentation

## 2019-11-06 DIAGNOSIS — M6281 Muscle weakness (generalized): Secondary | ICD-10-CM | POA: Insufficient documentation

## 2019-11-06 NOTE — Therapy (Signed)
[place], [place], [address] Phone: [phone]   Fax:  [phone]  Physical Therapy Treatment  Patient Details  Name: Victoria Deleon MRN: [id number] Date of Birth: [birth year] Referring Provider (PT): Ulyses Southward   Encounter Date: 11/06/2019  PT End of Session - 11/06/19 1302    Visit Number  10    Number of Visits  18    Date for PT Re-Evaluation  11/21/19   eval 09/26/19   Authorization Type  [employer] H[employer]. MEd necessity, auth required    Progress Note Due on Visit  19    PT Start Time  1258    PT Stop Time  1340    PT Time Calculation (min)  42 min    Activity Tolerance  Patient tolerated treatment well    Behavior During Therapy  WFL for tasks assessed/performed       Past Medical History:  Diagnosis Date  . Anxiety   . Arthritis   . Crohn disease ([place])   . Depression   . History of kidney stones   . Hypertension   . Hypothyroidism   . Macular degeneration   . PTSD (post-traumatic stress disorder)     Past Surgical History:  Procedure Laterality Date  . ABDOMINAL HYSTERECTOMY    . APPENDECTOMY    . BIOPSY  04/24/2018   Procedure: BIOPSY;  Surgeon: Daneil Dolin, MD;  [facility];  Service: Endoscopy;;  (gastric/duodenal)  . CHOLECYSTECTOMY    . COLONOSCOPY WITH PROPOFOL N/A 04/24/2018   Dr. Gala Romney: Normal terminal ileum with negative biopsies, normal-appearing colon with random colon biopsies negative, internal hemorrhoids.  Next colonoscopy 10 years.  . ESOPHAGOGASTRODUODENOSCOPY (EGD) WITH PROPOFOL N/A 04/24/2018   Dr. Gala Romney: Severe ulcerative reflux esophagitis, small hiatal hernia, small bowel biopsies negative for celiac.  Marland Kitchen MEDIAL PARTIAL KNEE REPLACEMENT Left   . TOTAL KNEE REVISION Left 09/10/2019   Procedure: LEFT TOTAL KNEE REVISION;  Surgeon: Frederik Pear, MD;  [facility]: [facility];  Service: Orthopedics;  Laterality: Left;    There were no vitals filed for this  visit.  Subjective Assessment - 11/06/19 1300    Subjective  Patient reported she had dental work laast week and that is why she was not able to come to therapy.    Currently in Pain?  Yes    Pain Score  4     Pain Location  Knee    Pain Orientation  Left    Pain Descriptors / Indicators  Aching;Cramping    Pain Type  Surgical pain                        [facility] Adult PT Treatment/Exercise - 11/06/19 0001      Knee/Hip Exercises: Stretches   Quad Stretch  Left;3 reps;30 seconds    Quad Stretch Limitations  With a strap, Modified Thomas Test    Knee: Self-Stretch to increase Flexion  10 seconds;Left    Knee: Self-Stretch Limitations  onto 12" step 10 reps    Gastroc Stretch  3 reps;Both;30 seconds    Gastroc Stretch Limitations  slant board      Knee/Hip Exercises: Standing   Heel Raises  Both;10 reps    Forward Step Up  20 reps;Hand Hold: 1;Step Height: 4";Left      Knee/Hip Exercises: Supine   Heel Slides  AROM;Left;15 reps   10'' holds   Knee Extension  AROM  Knee Extension Limitations  4    Knee Flexion  AROM    Knee Flexion Limitations  93      Knee/Hip Exercises: Prone   Hamstring Curl  3 sets;10 reps;5 seconds    Hamstring Curl Limitations         Manual Therapy   Manual Therapy  Joint mobilization;Soft tissue mobilization    Manual therapy comments  all manual interventions performed independently of other interventions    Joint Mobilization  left patellar mobilizations in all directions tolerated wel    Soft tissue mobilization  STM to distal quad and edemea mobilizaition to left knee                PT Short Term Goals - 10/24/19 1324      PT SHORT TERM GOAL #1   Title  Patient will be independent in self management strategies to improve quality of life and functional outcomes.    Time  4    Period  Weeks    Status  On-going    Target Date  10/24/19      PT SHORT TERM GOAL #2   Title  Patient will report at least 50% improvement  in overall symptoms and/or functional ability.    Baseline  30-40% better    Time  4    Period  Weeks    Status  On-going    Target Date  10/24/19      PT SHORT TERM GOAL #3   Title  Patient will be able to ambulate at least 300 feet in 2 minutes to demonstrate improved ambulatory mobility.    Baseline  426 ft    Time  4    Period  Weeks    Status  Achieved    Target Date  10/24/19        PT Long Term Goals - 10/24/19 1324      PT LONG TERM GOAL #1   Title  Patient will be able to ascend and descend stairs with reciprocal gait pattern to demonstrate improved stair mechanics and improve ability to go up/down stairs in her home.    Time  8    Period  Weeks    Status  On-going      PT LONG TERM GOAL #2   Title  Patient will be able to demonstrate at least 2-120 degrees of left knee ROM to demonstrate improved knee mobility    Time  8    Period  Weeks    Status  On-going      PT LONG TERM GOAL #3   Title  Patient will report at least 75% improvement in overall symptoms and/or functional ability.    Baseline  30-40% better    Time  8    Period  Weeks    Status  On-going            Plan - 11/06/19 1343    Clinical Impression Statement  Focused on improving AROM this session particularly with knee flexion. Performed step-ups this session as patient reported she is still having trouble with this at home. Ended with manual therapy with patient reporting an improvement in overall pain symptoms at the end of the session. Patient's AROM improved and ranged from 4-93 degrees. No redness or excessive swelling was noted on the left knee this session.    Personal Factors and Comorbidities  Comorbidity 1;Comorbidity 2;Comorbidity 3+    Comorbidities  hx of anxiety, PTSD, macular degeneration, HTN, depression, crohn  disease, LBP    Examination-Activity Limitations  Bend;Squat;Stairs;Stand;Transfers;Locomotion Level;Dressing;Sleep    Examination-Participation Restrictions   Cleaning;Community Activity;Yard Work;Meal Prep    Stability/Clinical Decision Making  Stable/Uncomplicated    Rehab Potential  Good    PT Frequency  Other (comment)   3x/week for 2 weeks then 2x/week for 6 weeks   PT Duration  8 weeks    PT Treatment/Interventions  ADLs/Self Care Home Management;Aquatic Therapy;Cryotherapy;Electrical Stimulation;Moist Heat;Traction;Balance training;Therapeutic exercise;Manual techniques;Therapeutic activities;Stair training;Gait training;DME Instruction;Neuromuscular re-education;Patient/family education;Dry needling    PT Next Visit Plan  knee ROM . focus on walking mechanics and ROM initially and progress strength when ROM improves. f/u with compression use    PT Home Exercise Plan  4/28, AAROM knee flexion EOB, calf stretch, continue HHPT exercises; 5/13 wall knee flexion; 5/24 prone knee bends, quad isometrics, knee hang    Consulted and Agree with Plan of Care  Patient       Patient will benefit from skilled therapeutic intervention in order to improve the following deficits and impairments:  Decreased range of motion, Difficulty walking, Decreased mobility, Pain, Increased edema, Decreased scar mobility, Abnormal gait, Decreased endurance, Decreased skin integrity, Decreased activity tolerance, Decreased strength, Decreased balance  Visit Diagnosis: Chronic pain of left knee  Muscle weakness (generalized)  Difficulty in walking, not elsewhere classified     Problem List Patient Active Problem List   Diagnosis Date Noted  . 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 GERD (gastroesophageal reflux disease) 07/27/2018  . Constipation 07/27/2018  . Nonspecific abnormal electrocardiogram (ECG) (EKG) 07/27/2018  . Crohn's disease ([place]) 02/13/2018   Clarene Critchley PT, DPT 1:45 PM, 11/06/19 [place] [address] [place], [place], [address] Phone: [phone]   Fax:  [phone]  Name: Victoria Deleon MRN: [id number] Date of Birth: 20-Feb-1966

## 2019-11-08 ENCOUNTER — Ambulatory Visit (HOSPITAL_COMMUNITY): Payer: Medicare HMO | Admitting: Physical Therapy

## 2019-11-08 ENCOUNTER — Encounter (HOSPITAL_COMMUNITY): Payer: Self-pay | Admitting: Physical Therapy

## 2019-11-08 ENCOUNTER — Other Ambulatory Visit: Payer: Self-pay

## 2019-11-08 DIAGNOSIS — R262 Difficulty in walking, not elsewhere classified: Secondary | ICD-10-CM | POA: Diagnosis not present

## 2019-11-08 DIAGNOSIS — G8929 Other chronic pain: Secondary | ICD-10-CM | POA: Diagnosis not present

## 2019-11-08 DIAGNOSIS — M6281 Muscle weakness (generalized): Secondary | ICD-10-CM | POA: Diagnosis not present

## 2019-11-08 DIAGNOSIS — M25562 Pain in left knee: Secondary | ICD-10-CM

## 2019-11-08 NOTE — Therapy (Signed)
[facility] [place], [place], [address] Phone: [phone]   Fax:  [phone]  Physical Therapy Treatment  Patient Details  Name: Victoria Deleon MRN: [id number] Date of Birth: 1965/11/26 Referring Provider (PT): Ulyses Southward   Encounter Date: 11/08/2019   PT End of Session - 11/08/19 1321    Visit Number 11    Number of Visits 18    Date for PT Re-Evaluation 11/21/19   eval 09/26/19   Authorization Type [employer] H[employer]. MEd necessity, auth required    Progress Note Due on Visit 19    PT Start Time 1316    PT Stop Time 1355    PT Time Calculation (min) 39 min    Activity Tolerance Patient tolerated treatment well    Behavior During Therapy WFL for tasks assessed/performed           Past Medical History:  Diagnosis Date  . Anxiety   . Arthritis   . Crohn disease ([place])   . Depression   . History of kidney stones   . Hypertension   . Hypothyroidism   . Macular degeneration   . PTSD (post-traumatic stress disorder)     Past Surgical History:  Procedure Laterality Date  . ABDOMINAL HYSTERECTOMY    . APPENDECTOMY    . BIOPSY  04/24/2018   Procedure: BIOPSY;  Surgeon: Daneil Dolin, MD;  [facility];  Service: Endoscopy;;  (gastric/duodenal)  . CHOLECYSTECTOMY    . COLONOSCOPY WITH PROPOFOL N/A 04/24/2018   Dr. Gala Romney: Normal terminal ileum with negative biopsies, normal-appearing colon with random colon biopsies negative, internal hemorrhoids.  Next colonoscopy 10 years.  . ESOPHAGOGASTRODUODENOSCOPY (EGD) WITH PROPOFOL N/A 04/24/2018   Dr. Gala Romney: Severe ulcerative reflux esophagitis, small hiatal hernia, small bowel biopsies negative for celiac.  Marland Kitchen MEDIAL PARTIAL KNEE REPLACEMENT Left   . TOTAL KNEE REVISION Left 09/10/2019   Procedure: LEFT TOTAL KNEE REVISION;  Surgeon: Frederik Pear, MD;  [facility];  Service: Orthopedics;  Laterality: Left;    There were no vitals filed for this  visit.   Subjective Assessment - 11/08/19 1326    Subjective States that the cramping is doing better. Her leg is a little achy today because of the damp weather. Reports otherwise she feels pretty good.              [facility] PT Assessment - 11/08/19 0001      Assessment   Medical Diagnosis s/p Left TKA revision    Referring Provider (PT) Ulyses Southward    Next MD Visit July 1st                          [facility] Adult PT Treatment/Exercise - 11/08/19 0001      Ambulation/Gait   Ambulation/Gait Yes    Stairs Yes    Gait Comments 4" steps with one railing x25, then 7" steps x25 times with one railing       Knee/Hip Exercises: Standing   Other Standing Knee Exercises TKE walking 2 plates x8 --> 3 plates retro R42 then forward x10     Other Standing Knee Exercises monster walk  - no resistance - verbala nd visual cues 5x 30 ft hallway       Knee/Hip Exercises: Seated   Sit to Sand 5 reps   4 sets - staggered stance with focus on L      Knee/Hip Exercises: Supine  Knee Extension AROM    Knee Extension Limitations 3    Knee Flexion AROM    Knee Flexion Limitations 100                    PT Short Term Goals - 10/24/19 1324      PT SHORT TERM GOAL #1   Title Patient will be independent in self management strategies to improve quality of life and functional outcomes.    Time 4    Period Weeks    Status On-going    Target Date 10/24/19      PT SHORT TERM GOAL #2   Title Patient will report at least 50% improvement in overall symptoms and/or functional ability.    Baseline 30-40% better    Time 4    Period Weeks    Status On-going    Target Date 10/24/19      PT SHORT TERM GOAL #3   Title Patient will be able to ambulate at least 300 feet in 2 minutes to demonstrate improved ambulatory mobility.    Baseline 426 ft    Time 4    Period Weeks    Status Achieved    Target Date 10/24/19             PT Long Term Goals - 10/24/19 1324      PT  LONG TERM GOAL #1   Title Patient will be able to ascend and descend stairs with reciprocal gait pattern to demonstrate improved stair mechanics and improve ability to go up/down stairs in her home.    Time 8    Period Weeks    Status On-going      PT LONG TERM GOAL #2   Title Patient will be able to demonstrate at least 2-120 degrees of left knee ROM to demonstrate improved knee mobility    Time 8    Period Weeks    Status On-going      PT LONG TERM GOAL #3   Title Patient will report at least 75% improvement in overall symptoms and/or functional ability.    Baseline 30-40% better    Time 8    Period Weeks    Status On-going                 Plan - 11/08/19 1322    Clinical Impression Statement Focused on functional mobility with stairs initially, stair navigation improved with practice and repetition. Decreased pain noted with this after exercises. Improved knee ROM noted on this date. Patient is progressing well with continued limitations in ROM but this continues to improve each session. Will continue to focus on knee extension and flexion strength and end ranges of motion as tolerated.    Personal Factors and Comorbidities Comorbidity 1;Comorbidity 2;Comorbidity 3+    Comorbidities hx of anxiety, PTSD, macular degeneration, HTN, depression, crohn disease, LBP    Examination-Activity Limitations Bend;Squat;Stairs;Stand;Transfers;Locomotion Level;Dressing;Sleep    Examination-Participation Restrictions Cleaning;Community Activity;Yard Work;Meal Prep    Stability/Clinical Decision Making Stable/Uncomplicated    Rehab Potential Good    PT Frequency Other (comment)   3x/week for 2 weeks then 2x/week for 6 weeks   PT Duration 8 weeks    PT Treatment/Interventions ADLs/Self Care Home Management;Aquatic Therapy;Cryotherapy;Electrical Stimulation;Moist Heat;Traction;Balance training;Therapeutic exercise;Manual techniques;Therapeutic activities;Stair training;Gait training;DME  Instruction;Neuromuscular re-education;Patient/family education;Dry needling    PT Next Visit Plan end range knee strengthening, . focus on walking mechanics and ROM initially and progress strength when ROM improves. f/u with compression use  PT Home Exercise Plan 4/28, AAROM knee flexion EOB, calf stretch, continue HHPT exercises; 5/13 wall knee flexion; 5/24 prone knee bends, quad isometrics, knee hang    Consulted and Agree with Plan of Care Patient           Patient will benefit from skilled therapeutic intervention in order to improve the following deficits and impairments:  Decreased range of motion, Difficulty walking, Decreased mobility, Pain, Increased edema, Decreased scar mobility, Abnormal gait, Decreased endurance, Decreased skin integrity, Decreased activity tolerance, Decreased strength, Decreased balance  Visit Diagnosis: Chronic pain of left knee  Muscle weakness (generalized)     Problem List Patient Active Problem List   Diagnosis Date Noted  . 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 GERD (gastroesophageal reflux disease) 07/27/2018  . Constipation 07/27/2018  . Nonspecific abnormal electrocardiogram (ECG) (EKG) 07/27/2018  . Crohn's disease ([place]) 02/13/2018   2:12 PM, 11/08/19 Jerene Pitch, DPT Physical Therapy with [facility]  [phone] office  [facility] [address] [place], [place], [address] Phone: [phone]   Fax:  [phone]  Name: Victoria Deleon MRN: [id number] Date of Birth: 1965-11-09

## 2019-11-12 ENCOUNTER — Telehealth (HOSPITAL_COMMUNITY): Payer: Self-pay | Admitting: Physical Therapy

## 2019-11-12 NOTE — Telephone Encounter (Signed)
cancelled appt for 6/15 because she is with family and will not make it back

## 2019-11-13 ENCOUNTER — Ambulatory Visit (HOSPITAL_COMMUNITY): Payer: Medicare HMO | Admitting: Physical Therapy

## 2019-11-14 DIAGNOSIS — Z6832 Body mass index (BMI) 32.0-32.9, adult: Secondary | ICD-10-CM | POA: Diagnosis not present

## 2019-11-14 DIAGNOSIS — E038 Other specified hypothyroidism: Secondary | ICD-10-CM | POA: Diagnosis not present

## 2019-11-14 DIAGNOSIS — K219 Gastro-esophageal reflux disease without esophagitis: Secondary | ICD-10-CM | POA: Diagnosis not present

## 2019-11-14 DIAGNOSIS — I10 Essential (primary) hypertension: Secondary | ICD-10-CM | POA: Diagnosis not present

## 2019-11-14 DIAGNOSIS — Z0000 Encounter for general adult medical examination without abnormal findings: Secondary | ICD-10-CM | POA: Diagnosis not present

## 2019-11-14 DIAGNOSIS — E7849 Other hyperlipidemia: Secondary | ICD-10-CM | POA: Diagnosis not present

## 2019-11-15 ENCOUNTER — Ambulatory Visit (HOSPITAL_COMMUNITY): Payer: Medicare HMO | Admitting: Physical Therapy

## 2019-11-15 ENCOUNTER — Other Ambulatory Visit: Payer: Self-pay

## 2019-11-15 DIAGNOSIS — M6281 Muscle weakness (generalized): Secondary | ICD-10-CM

## 2019-11-15 DIAGNOSIS — R262 Difficulty in walking, not elsewhere classified: Secondary | ICD-10-CM | POA: Diagnosis not present

## 2019-11-15 DIAGNOSIS — G8929 Other chronic pain: Secondary | ICD-10-CM | POA: Diagnosis not present

## 2019-11-15 DIAGNOSIS — M25562 Pain in left knee: Secondary | ICD-10-CM | POA: Diagnosis not present

## 2019-11-15 NOTE — Therapy (Signed)
[place], [place], [address] Phone: [phone]   Fax:  [phone]  Physical Therapy Treatment  Patient Details  Name: Victoria Deleon MRN: [id number] Date of Birth: [birth year] Referring Provider (PT): Ulyses Southward   Encounter Date: 11/15/2019   PT End of Session - 11/15/19 1404    Visit Number 12    Number of Visits 18    Date for PT Re-Evaluation 11/21/19   eval 09/26/19   Authorization Type [employer] HMO. MEd necessity, auth required    Progress Note Due on Visit 19    PT Start Time 1320    PT Stop Time 1400    PT Time Calculation (min) 40 min    Activity Tolerance Patient tolerated treatment well    Behavior During Therapy WFL for tasks assessed/performed           Past Medical History:  Diagnosis Date  . Anxiety   . Arthritis   . Crohn disease ([place])   . Depression   . History of kidney stones   . Hypertension   . Hypothyroidism   . Macular degeneration   . PTSD (post-traumatic stress disorder)     Past Surgical History:  Procedure Laterality Date  . ABDOMINAL HYSTERECTOMY    . APPENDECTOMY    . BIOPSY  04/24/2018   Procedure: BIOPSY;  Surgeon: Daneil Dolin, MD;  [facility];  Service: Endoscopy;;  (gastric/duodenal)  . CHOLECYSTECTOMY    . COLONOSCOPY WITH PROPOFOL N/A 04/24/2018   Dr. Gala Romney: Normal terminal ileum with negative biopsies, normal-appearing colon with random colon biopsies negative, internal hemorrhoids.  Next colonoscopy 10 years.  . ESOPHAGOGASTRODUODENOSCOPY (EGD) WITH PROPOFOL N/A 04/24/2018   Dr. Gala Romney: Severe ulcerative reflux esophagitis, small hiatal hernia, small bowel biopsies negative for celiac.  Marland Kitchen MEDIAL PARTIAL KNEE REPLACEMENT Left   . TOTAL KNEE REVISION Left 09/10/2019   Procedure: LEFT TOTAL KNEE REVISION;  Surgeon: Frederik Pear, MD;  [facility];  Service: Orthopedics;  Laterality: Left;    There were no vitals filed for this  visit.   Subjective Assessment - 11/15/19 1325    Subjective No real pain just some tenderness.  STates she is now driving her straight shift car.  Only real difficulty she's having is with steps.    Currently in Pain? No/denies                             [facility] Adult PT Treatment/Exercise - 11/15/19 0001      Knee/Hip Exercises: Stretches   Knee: Self-Stretch to increase Flexion 10 seconds;Left    Knee: Self-Stretch Limitations onto 12" step 10 reps      Knee/Hip Exercises: Aerobic   Stationary Bike at BOS full revolutions 3 minutes seate 6      Knee/Hip Exercises: Machines for Strengthening   Cybex Leg Press 5 PL 2X10       Knee/Hip Exercises: Standing   Lateral Step Up 2 sets;10 reps;Step Height: 6";Hand Hold: 1;Left    Forward Step Up Left;2 sets;10 reps;Hand Hold: 1;Step Height: 6"    Stairs 7" reciprocally wiht 0-1 HR 10RT      Knee/Hip Exercises: Seated   Sit to Sand 5 reps      Knee/Hip Exercises: Supine   Knee Extension AROM    Knee Extension Limitations 3    Knee Flexion AROM;AAROM    Knee Flexion Limitations 107/110  PT Short Term Goals - 10/24/19 1324      PT SHORT TERM GOAL #1   Title Patient will be independent in self management strategies to improve quality of life and functional outcomes.    Time 4    Period Weeks    Status On-going    Target Date 10/24/19      PT SHORT TERM GOAL #2   Title Patient will report at least 50% improvement in overall symptoms and/or functional ability.    Baseline 30-40% better    Time 4    Period Weeks    Status On-going    Target Date 10/24/19      PT SHORT TERM GOAL #3   Title Patient will be able to ambulate at least 300 feet in 2 minutes to demonstrate improved ambulatory mobility.    Baseline 426 ft    Time 4    Period Weeks    Status Achieved    Target Date 10/24/19             PT Long Term Goals - 10/24/19 1324      PT LONG TERM GOAL #1   Title  Patient will be able to ascend and descend stairs with reciprocal gait pattern to demonstrate improved stair mechanics and improve ability to go up/down stairs in her home.    Time 8    Period Weeks    Status On-going      PT LONG TERM GOAL #2   Title Patient will be able to demonstrate at least 2-120 degrees of left knee ROM to demonstrate improved knee mobility    Time 8    Period Weeks    Status On-going      PT LONG TERM GOAL #3   Title Patient will report at least 75% improvement in overall symptoms and/or functional ability.    Baseline 30-40% better    Time 8    Period Weeks    Status On-going                 Plan - 11/15/19 1548    Clinical Impression Statement Continued focus on end ROM and functional strengthening.  Overall doing great with improvements in eccentric strength and ROM.  Smooth descent with step downs today using 1 UE and no pain.  Minimal tightness and no restrictions palpated around knee and AROM increased to 107 degrees at EOS.  AAROM 110.    Pt unable to voice any other functional limitations at this point, working on steps diligently at home, walking and ROM.    Personal Factors and Comorbidities Comorbidity 1;Comorbidity 2;Comorbidity 3+    Comorbidities hx of anxiety, PTSD, macular degeneration, HTN, depression, crohn disease, LBP    Examination-Activity Limitations Bend;Squat;Stairs;Stand;Transfers;Locomotion Level;Dressing;Sleep    Examination-Participation Restrictions Cleaning;Community Activity;Yard Work;Meal Prep    Stability/Clinical Decision Making Stable/Uncomplicated    Rehab Potential Good    PT Frequency Other (comment)   3x/week for 2 weeks then 2x/week for 6 weeks   PT Duration 8 weeks    PT Treatment/Interventions ADLs/Self Care Home Management;Aquatic Therapy;Cryotherapy;Electrical Stimulation;Moist Heat;Traction;Balance training;Therapeutic exercise;Manual techniques;Therapeutic activities;Stair training;Gait training;DME  Instruction;Neuromuscular re-education;Patient/family education;Dry needling    PT Next Visit Plan focus on walking mechanics, ROM and progression of functional strength.    PT Home Exercise Plan 4/28, AAROM knee flexion EOB, calf stretch, continue HHPT exercises; 5/13 wall knee flexion; 5/24 prone knee bends, quad isometrics, knee hang    Consulted and Agree with Plan of Care Patient  Patient will benefit from skilled therapeutic intervention in order to improve the following deficits and impairments:  Decreased range of motion, Difficulty walking, Decreased mobility, Pain, Increased edema, Decreased scar mobility, Abnormal gait, Decreased endurance, Decreased skin integrity, Decreased activity tolerance, Decreased strength, Decreased balance  Visit Diagnosis: Chronic pain of left knee  Muscle weakness (generalized)  Difficulty in walking, not elsewhere classified     Problem List Patient Active Problem List   Diagnosis Date Noted  . 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 GERD (gastroesophageal reflux disease) 07/27/2018  . Constipation 07/27/2018  . Nonspecific abnormal electrocardiogram (ECG) (EKG) 07/27/2018  . Crohn's disease ([place]) 02/13/2018   Teena Irani, PTA/CLT [phone]  Teena Irani 11/15/2019, 3:49 PM  [place] [address] [place], [place], [address] Phone: [phone]   Fax:  [phone]  Name: Victoria Deleon MRN: [id number] Date of Birth: 06-23-1965

## 2019-11-20 ENCOUNTER — Ambulatory Visit (HOSPITAL_COMMUNITY): Payer: Medicare HMO | Admitting: Physical Therapy

## 2019-11-20 ENCOUNTER — Other Ambulatory Visit: Payer: Self-pay

## 2019-11-20 DIAGNOSIS — M25562 Pain in left knee: Secondary | ICD-10-CM | POA: Diagnosis not present

## 2019-11-20 DIAGNOSIS — G8929 Other chronic pain: Secondary | ICD-10-CM | POA: Diagnosis not present

## 2019-11-20 DIAGNOSIS — R262 Difficulty in walking, not elsewhere classified: Secondary | ICD-10-CM | POA: Diagnosis not present

## 2019-11-20 DIAGNOSIS — M6281 Muscle weakness (generalized): Secondary | ICD-10-CM

## 2019-11-20 NOTE — Therapy (Addendum)
[facility] [address] [place], [place], [address] Phone: [phone]   Fax:  [phone]  Physical Therapy Treatment and Discharge Note  Patient Details  Name: Victoria Deleon MRN: [id number] Date of Birth: 01/16/1966 Referring Provider (PT): Frank Rowen  PHYSICAL THERAPY DISCHARGE SUMMARY  Visits from Start of Care: 13  Current functional level related to goals / functional outcomes: 3/3 STG and 2/3 LTG met   Remaining deficits: See below   Education / Equipment: See below  Plan: Patient agrees to discharge.  Patient goals were partially met. Patient is being discharged due to being pleased with the current functional level.  ?????          3:28 PM, 11/20/19 Michele Landry, DPT Physical Therapy with [facility]  [phone] office  Encounter Date: 11/20/2019   PT End of Session - 11/20/19 1449    Visit Number 13    Number of Visits 18    Date for PT Re-Evaluation 11/21/19   eval 09/26/19   Authorization Type [employer] HMO. MEd necessity, auth required    Progress Note Due on Visit 19    PT Start Time 1419    PT Stop Time 1450    PT Time Calculation (min) 31 min    Activity Tolerance Patient tolerated treatment well    Behavior During Therapy WFL for tasks assessed/performed           Past Medical History:  Diagnosis Date  . Anxiety   . Arthritis   . Crohn disease (HCC)   . Depression   . History of kidney stones   . Hypertension   . Hypothyroidism   . Macular degeneration   . PTSD (post-traumatic stress disorder)     Past Surgical History:  Procedure Laterality Date  . ABDOMINAL HYSTERECTOMY    . APPENDECTOMY    . BIOPSY  04/24/2018   Procedure: BIOPSY;  Surgeon: Rourk, Robert M, MD;  Location: [facility];  Service: Endoscopy;;  (gastric/duodenal)  . CHOLECYSTECTOMY    . COLONOSCOPY WITH PROPOFOL N/A 04/24/2018   Dr. Rourk: Normal terminal ileum with negative  biopsies, normal-appearing colon with random colon biopsies negative, internal hemorrhoids.  Next colonoscopy 10 years.  . ESOPHAGOGASTRODUODENOSCOPY (EGD) WITH PROPOFOL N/A 04/24/2018   Dr. Rourk: Severe ulcerative reflux esophagitis, small hiatal hernia, small bowel biopsies negative for celiac.  . MEDIAL PARTIAL KNEE REPLACEMENT Left   . TOTAL KNEE REVISION Left 09/10/2019   Procedure: LEFT TOTAL KNEE REVISION;  Surgeon: Rowan, Frank, MD;  Location: [facility] ORS;  Service: Orthopedics;  Laterality: Left;    There were no vitals filed for this visit.   Subjective Assessment - 11/20/19 1333    Subjective Pt states she mostly has no pain, just stiffness at times.  STates she feels she is ready to be done with therapy at this time.    Currently in Pain? No/denies              [facility] PT Assessment - 11/20/19 0001      Assessment   Medical Diagnosis s/p Left TKA revision    Referring Provider (PT) Frank Rowen    Next MD Visit July 1st       Observation/Other Assessments   Observations intact, incision healed nicely, no redness    Focus on Therapeutic Outcomes (FOTO)  29% limited   was 68% limited at Initial evaluation     Observation/Other Assessments-Edema    Edema Circumferential        Circumferential Edema   Circumferential - Right 37cm     Circumferential - Left  41cm at joint line    was 43cm     AROM   Right/Left Knee Left;Right    Right Knee Extension 0    Right Knee Flexion 135    Left Knee Extension 2   was lacking 8 from neutral   Left Knee Flexion 116   was 82 degrees     Strength   Right Knee Flexion 5/5   was 4/5   Right Knee Extension 5/5   was 5/5   Left Knee Flexion 5/5   was 4/5   Left Knee Extension 5/5   was 4+/5   Right Ankle Dorsiflexion 5/5   was 4+/5   Left Ankle Dorsiflexion 5/5      Ambulation/Gait   Ambulation/Gait Yes    Ambulation/Gait Assistance 6: Modified independent (Device/Increase time)    Ambulation Distance (Feet) 426 Feet    Assistive  device None    Gait Pattern Decreased stance time - left;Decreased hip/knee flexion - left;Left foot flat    Gait Comments 2MW                                   PT Short Term Goals - 11/20/19 1344      PT SHORT TERM GOAL #1   Title Patient will be independent in self management strategies to improve quality of life and functional outcomes.    Time 4    Period Weeks    Status Achieved    Target Date 10/24/19      PT SHORT TERM GOAL #2   Title Patient will report at least 50% improvement in overall symptoms and/or functional ability.    Baseline 30-40% better    Time 4    Period Weeks    Status Achieved    Target Date 10/24/19      PT SHORT TERM GOAL #3   Title Patient will be able to ambulate at least 300 feet in 2 minutes to demonstrate improved ambulatory mobility.    Baseline 426 ft    Time 4    Period Weeks    Status Achieved    Target Date 10/24/19             PT Long Term Goals - 11/20/19 1344      PT LONG TERM GOAL #1   Title Patient will be able to ascend and descend stairs with reciprocal gait pattern to demonstrate improved stair mechanics and improve ability to go up/down stairs in her home.    Time 8    Period Weeks    Status Achieved      PT LONG TERM GOAL #2   Title Patient will be able to demonstrate at least 2-120 degrees of left knee ROM to demonstrate improved knee mobility    Time 8    Period Weeks    Status Partially Met      PT LONG TERM GOAL #3   Title Patient will report at least 75% improvement in overall symptoms and/or functional ability.    Baseline 30-40% better    Time 8    Period Weeks    Status Achieved                 Plan - 11/20/19 1446    Clinical Impression Statement Pt returns today stating she feels she is ready to   be discharged.  States she has resumed all normal duties/chores around her home and is having no difficulty.  Met all STG/LTG with exception of flexion goal of 120 degrees  (currently 116).  Pt with only tightness/irritation at times but basically without pain or issues.    Personal Factors and Comorbidities Comorbidity 1;Comorbidity 2;Comorbidity 3+    Comorbidities hx of anxiety, PTSD, macular degeneration, HTN, depression, crohn disease, LBP    Examination-Activity Limitations Bend;Squat;Stairs;Stand;Transfers;Locomotion Level;Dressing;Sleep    Examination-Participation Restrictions Cleaning;Community Activity;Yard Work;Meal Prep    Stability/Clinical Decision Making Stable/Uncomplicated    Rehab Potential Good    PT Frequency Other (comment)   3x/week for 2 weeks then 2x/week for 6 weeks   PT Duration 8 weeks    PT Treatment/Interventions ADLs/Self Care Home Management;Aquatic Therapy;Cryotherapy;Electrical Stimulation;Moist Heat;Traction;Balance training;Therapeutic exercise;Manual techniques;Therapeutic activities;Stair training;Gait training;DME Instruction;Neuromuscular re-education;Patient/family education;Dry needling    PT Next Visit Plan Discharge as goals met, functional status returned to baseline.    PT Home Exercise Plan 4/28, AAROM knee flexion EOB, calf stretch, continue HHPT exercises; 5/13 wall knee flexion; 5/24 prone knee bends, quad isometrics, knee hang    Consulted and Agree with Plan of Care Patient           Patient will benefit from skilled therapeutic intervention in order to improve the following deficits and impairments:  Decreased range of motion, Difficulty walking, Decreased mobility, Pain, Increased edema, Decreased scar mobility, Abnormal gait, Decreased endurance, Decreased skin integrity, Decreased activity tolerance, Decreased strength, Decreased balance  Visit Diagnosis: Chronic pain of left knee  Muscle weakness (generalized)  Difficulty in walking, not elsewhere classified     Problem List Patient Active Problem List   Diagnosis Date Noted  . 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  Pain due to unicompartmental arthroplasty of knee (HCC) 09/06/2019  . GERD (gastroesophageal reflux disease) 07/27/2018  . Constipation 07/27/2018  . Nonspecific abnormal electrocardiogram (ECG) (EKG) 07/27/2018  . Crohn's disease (HCC) 02/13/2018    B , PTA/CLT [phone]  ,  B 11/20/2019, 2:50 PM  [facility] [address] [place], [place], [address] Phone: [phone]   Fax:  [phone]  Name: Victoria Deleon MRN: [id number] Date of Birth: 09/23/1965   

## 2019-11-22 ENCOUNTER — Ambulatory Visit (HOSPITAL_COMMUNITY): Payer: Medicare HMO | Admitting: Physical Therapy

## 2019-12-04 DIAGNOSIS — M47816 Spondylosis without myelopathy or radiculopathy, lumbar region: Secondary | ICD-10-CM | POA: Diagnosis not present

## 2019-12-24 DIAGNOSIS — F17210 Nicotine dependence, cigarettes, uncomplicated: Secondary | ICD-10-CM | POA: Diagnosis not present

## 2019-12-24 DIAGNOSIS — M5416 Radiculopathy, lumbar region: Secondary | ICD-10-CM | POA: Diagnosis not present

## 2020-01-03 DIAGNOSIS — M5416 Radiculopathy, lumbar region: Secondary | ICD-10-CM | POA: Diagnosis not present

## 2020-01-14 ENCOUNTER — Telehealth: Payer: Self-pay | Admitting: Internal Medicine

## 2020-01-14 NOTE — Telephone Encounter (Signed)
Spoke with pt. Linzess 72 mcg samples are ready for pick up.

## 2020-01-14 NOTE — Telephone Encounter (Signed)
PATIENT CALLED REQUESTING LINZESS SAMPLES

## 2020-01-25 ENCOUNTER — Other Ambulatory Visit: Payer: Self-pay | Admitting: *Deleted

## 2020-01-25 ENCOUNTER — Other Ambulatory Visit: Payer: Self-pay

## 2020-01-25 ENCOUNTER — Encounter: Payer: Self-pay | Admitting: Family Medicine

## 2020-01-25 ENCOUNTER — Ambulatory Visit (INDEPENDENT_AMBULATORY_CARE_PROVIDER_SITE_OTHER): Payer: Medicare HMO | Admitting: Family Medicine

## 2020-01-25 VITALS — BP 118/84 | HR 61 | Temp 97.0°F | Resp 18 | Ht 61.0 in | Wt 171.8 lb

## 2020-01-25 DIAGNOSIS — E785 Hyperlipidemia, unspecified: Secondary | ICD-10-CM | POA: Diagnosis not present

## 2020-01-25 DIAGNOSIS — F419 Anxiety disorder, unspecified: Secondary | ICD-10-CM | POA: Insufficient documentation

## 2020-01-25 DIAGNOSIS — I10 Essential (primary) hypertension: Secondary | ICD-10-CM

## 2020-01-25 DIAGNOSIS — E559 Vitamin D deficiency, unspecified: Secondary | ICD-10-CM

## 2020-01-25 DIAGNOSIS — E039 Hypothyroidism, unspecified: Secondary | ICD-10-CM | POA: Diagnosis not present

## 2020-01-25 DIAGNOSIS — F4310 Post-traumatic stress disorder, unspecified: Secondary | ICD-10-CM | POA: Diagnosis not present

## 2020-01-25 DIAGNOSIS — F410 Panic disorder [episodic paroxysmal anxiety] without agoraphobia: Secondary | ICD-10-CM | POA: Insufficient documentation

## 2020-01-25 DIAGNOSIS — Z124 Encounter for screening for malignant neoplasm of cervix: Secondary | ICD-10-CM | POA: Diagnosis not present

## 2020-01-25 DIAGNOSIS — Z1231 Encounter for screening mammogram for malignant neoplasm of breast: Secondary | ICD-10-CM | POA: Insufficient documentation

## 2020-01-25 MED ORDER — METOPROLOL SUCCINATE ER 25 MG PO TB24: 25.0000 mg | ORAL_TABLET | Freq: Every day | ORAL | 3 refills | Status: DC

## 2020-01-25 MED ORDER — LINACLOTIDE 72 MCG PO CAPS: 72.0000 ug | ORAL_CAPSULE | Freq: Every day | ORAL | 3 refills | Status: DC

## 2020-01-25 MED ORDER — DULOXETINE HCL 20 MG PO CPEP: 20.0000 mg | ORAL_CAPSULE | Freq: Every day | ORAL | 0 refills | Status: DC

## 2020-01-25 MED ORDER — SERTRALINE HCL 25 MG PO TABS: 25.0000 mg | ORAL_TABLET | Freq: Every day | ORAL | 1 refills | Status: DC

## 2020-01-25 MED ORDER — LEVOTHYROXINE SODIUM 100 MCG PO TABS: 100.0000 ug | ORAL_TABLET | Freq: Every evening | ORAL | 3 refills | Status: DC

## 2020-01-25 MED ORDER — PANTOPRAZOLE SODIUM 40 MG PO TBEC: 40.0000 mg | DELAYED_RELEASE_TABLET | Freq: Every day | ORAL | 3 refills | Status: DC

## 2020-01-25 NOTE — Patient Instructions (Addendum)
I appreciate the opportunity to provide you with care for your health and wellness. Today we discussed: establish care   Follow up: 4 weeks anxiety med change   Labs-fasting today at [employer] [facility] Referrals today: therapy   Please review the medication changes I have made today.  I have ordered therapy to get started. I will try to navigate your medication needs, if it does not work we will look into psychiatry as well.    Nice to meet you today :)  Please continue to practice social distancing to keep you, your family, and our community safe.  If you must go out, please wear a mask and practice good handwashing.  It was a pleasure to see you and I look forward to continuing to work together on your health and well-being. Please do not hesitate to call the office if you need care or have questions about your care.  Have a wonderful day and week. With Gratitude, Cherly Beach, DNP, AGNP-BC

## 2020-01-25 NOTE — Assessment & Plan Note (Signed)
Need for updated mammogram, ordered today.

## 2020-01-25 NOTE — Assessment & Plan Note (Signed)
Heart healthy diet encouraged, updated labs ordered continue Lipitor at this time.  No noted myalgias or pains with statin use.

## 2020-01-25 NOTE — Assessment & Plan Note (Signed)
Referral to GYN 

## 2020-01-25 NOTE — Assessment & Plan Note (Signed)
Adjustment of medications to help with anxiety.  Switch from Celexa to Zoloft.  Reduction in Cymbalta.  Continued of doxepin.  Set up for psychiatry in November.  And set up for therapy in the next 2 weeks.  I do not feel like her PTSD has been addressed and focused on as this is the driver of her anxiety.  So I have tried to adjust her medications to be more focused to PTSD to see if that would help some of her anxiety driven measures.  Zoloft is starting in a equated level for Celexa.  We will increase the dose slowly as needed.

## 2020-01-25 NOTE — Assessment & Plan Note (Signed)
She reports she had an elevation in her TSH level she does not remember what it was.  Will be checking today as this is a derivative of anxiety as well.

## 2020-01-25 NOTE — Assessment & Plan Note (Signed)
Ongoing anxiety, reports that she does get out about.  However I think that given that she has thyroid issues she might also have changes in her vitamin D level so would be good for [place] to check it.  Orders today.

## 2020-01-25 NOTE — Assessment & Plan Note (Signed)
Controlled continue current medication as directed.  I would like to possibly look at changing her medication as she is on Toprol as a monotherapy agent for blood pressure.  I think she would benefit better from being on lisinopril and/or amlodipine.  We will visit this at next appointment.  She is encouraged to do DASH diet and 30 minutes of exercise daily.

## 2020-01-25 NOTE — Progress Notes (Signed)
Subjective:  Patient ID: Victoria Deleon, female    DOB: 1965-09-13  Age: 54 y.o. MRN: [id number]  CC:  Chief Complaint  Patient presents with  . New Patient (Initial Visit)    new pt establishing care would like to discuss anxiety has taken herself off of buspar and has been off for the past three months would like to discuss other options       HPI  HPI  Victoria Deleon is a 54 year old female patient who presents today to establish care.  She previously was seen in [place]. She comes in today secondary to needing help with anxiety/PTSD which is very uncontrolled at this time for her.  She has a history that includes anxiety, Crohn's, depression, hypertension, hyperlipidemia, macular degeneration in both eyes, PTSD, current smoker.  She reports that she is adopted.  She had a very difficult upbringing.  And this for most of her PTSD comes from.  She is currently taking several medications to help with anxiety including Cymbalta, Celexa, doxepin, weaned herself off of the BuSpar 2 months ago as she reports that it was making her feel worse and not better.  However she was on a 30 mg tablet once a day and not throughout the day.  To help with her anxiety she focuses on essential oils, music, making a calming environment for her at home, craft time, meditation.  She notes that she has this issue and she works really hard to using on pharmacological agents to help her get under control.   She reports that she has 3 pinched nerves in her back and is currently going to [facility] neurological for this and getting injections.  She is due for mammogram and a Pap smear and is willing to have those set up.  She is followed closely by GI secondary to having Crohn's.  Takes Linzess when they have samples to give her.  Additionally she reports her TSH was elevated earlier this year.  She does not remember what the number was.  But she reports that she feels like she needs to have this rechecked.  She  currently smokes a quarter of a pack a day.   She reports that she sleeps fair.  Sleeps 5 to 6 hours at a time.  She does not nap during the day.  She reports recent denture fitting on the top.  Bottom is good.  However she is having a hard time chewing and adjusting to the denture placement.  She reports she does not have any blood urine or stool.  Does get constipation but the Linzess as stated above helps.  It is expensive so she can only use it when she gets a samples from her office.  She denies having any memory issues.  Denies having any trauma or falls.  She denies have any skin issues.  She denies having any hearing issues.  Recently is seeing her eye doctor no change in her eyes.  She does have glasses.  Today patient denies signs and symptoms of COVID 19 infection including fever, chills, cough, shortness of breath, and headache. Past Medical, Surgical, Social History, Allergies, and Medications have been Reviewed.   Past Medical History:  Diagnosis Date  . Anxiety   . Arthritis   . Crohn disease ([place])   . Depression   . History of kidney stones   . Hypertension   . Hypothyroidism   . Macular degeneration   . Nonspecific abnormal electrocardiogram (ECG) (EKG) 07/27/2018  .  Pain due to unicompartmental arthroplasty of knee ([place]) 09/06/2019  . PTSD (post-traumatic stress disorder)   . Status post revision of total replacement of left knee 09/10/2019    Current Meds  Medication Sig  . aspirin EC 81 MG tablet Take 1 tablet (81 mg total) by mouth 2 (two) times daily.  Marland Kitchen atorvastatin (LIPITOR) 40 MG tablet Take 40 mg by mouth at bedtime.   Marland Kitchen doxepin (SINEQUAN) 10 MG capsule Take 10 mg by mouth every evening.   Marland Kitchen FIBER PO Take 1 capsule by mouth 2 (two) times daily.   Marland Kitchen gabapentin (NEURONTIN) 100 MG capsule Take 1 capsule (100 mg total) by mouth 3 (three) times daily.  Marland Kitchen levothyroxine (SYNTHROID) 100 MCG tablet Take 100 mcg by mouth every evening.  . linaclotide (LINZESS) 72 MCG  capsule Take 72 mcg by mouth daily.  . metoprolol succinate (TOPROL-XL) 25 MG 24 hr tablet Take 25 mg by mouth daily.  . Multiple Vitamin (MULTIVITAMIN WITH MINERALS) TABS tablet Take 1 tablet by mouth daily.  . Multiple Vitamins-Minerals (PRESERVISION AREDS 2) CAPS Take 1 capsule by mouth in the morning and at bedtime.  . pantoprazole (PROTONIX) 40 MG tablet Take 40 mg by mouth daily.  Marland Kitchen tiZANidine (ZANAFLEX) 2 MG tablet Take 1 tablet (2 mg total) by mouth every 6 (six) hours as needed.  . [DISCONTINUED] citalopram (CELEXA) 20 MG tablet Take 20 mg by mouth daily.  . [DISCONTINUED] DULoxetine (CYMBALTA) 30 MG capsule Take 30 mg by mouth daily.  . [DISCONTINUED] naproxen sodium (ALEVE) 220 MG tablet Take 440 mg by mouth daily as needed (for pain or headache).    ROS:  Review of Systems  HENT: Negative.   Eyes: Negative.   Respiratory: Negative.   Cardiovascular: Negative.   Gastrointestinal: Negative.   Genitourinary: Negative.   Skin: Negative.   Neurological: Negative.   Endo/Heme/Allergies: Negative.   Psychiatric/Behavioral: Positive for depression. The patient is nervous/anxious.      Objective:   Today's Vitals: BP 118/84 (BP Location: Right Arm, Patient Position: Sitting, Cuff Size: Normal)   Pulse 61   Temp (!) 97 F (36.1 C) (Temporal)   Resp 18   Ht _0  (1.549 m)   Wt 171 lb 12.8 oz (77.9 kg)   SpO2 95%   BMI 32.46 kg/m  Vitals with BMI 01/25/2020 09/10/2019 09/10/2019  Height _1  - -  Weight 171 lbs 13 oz - -  BMI 63.78 - -  Systolic 588 502 774  Diastolic 84 88 76  Pulse 61 81 66     Physical Exam Vitals and nursing note reviewed.  Constitutional:      Appearance: Normal appearance. She is well-developed and well-groomed. She is obese.  HENT:     Head: Normocephalic and atraumatic.     Right Ear: External ear normal.     Left Ear: External ear normal.     Mouth/Throat:     Comments: Mask in place Eyes:     General:        Right eye: No discharge.         Left eye: No discharge.     Conjunctiva/sclera: Conjunctivae normal.  Cardiovascular:     Rate and Rhythm: Normal rate and regular rhythm.     Pulses: Normal pulses.     Heart sounds: Normal heart sounds.  Pulmonary:     Effort: Pulmonary effort is normal.     Breath sounds: Normal breath sounds.  Musculoskeletal:  General: Normal range of motion.     Cervical back: Normal range of motion and neck supple.  Skin:    General: Skin is warm.  Neurological:     General: No focal deficit present.     Mental Status: She is alert and oriented to person, place, and time.  Psychiatric:        Attention and Perception: Attention and perception normal.        Mood and Affect: Mood is anxious.        Speech: Speech normal.        Behavior: Behavior normal. Behavior is cooperative.        Thought Content: Thought content normal.        Cognition and Memory: Cognition and memory normal.        Judgment: Judgment normal.     Comments: Good communication, pleasant in communication.  Good eye contact.  Slightly anxious mood is noted in conversation about trying to feel better.     Assessment   1. Anxiety   2. PTSD (post-traumatic stress disorder)   3. Essential hypertension   4. Screening mammogram, encounter for   5. Routine cervical smear   6. Vitamin D deficiency   7. Hyperlipidemia, unspecified hyperlipidemia type   8. Hypothyroidism, unspecified type     Tests ordered Orders Placed This Encounter  Procedures  . MM 3D SCREEN BREAST BILATERAL  . CBC  . CMP14+EGFR  . Lipid panel  . VITAMIN D 25 Hydroxy (Vit-D Deficiency, Fractures)  . TSH  . Ambulatory referral to Obstetrics / Gynecology  . Ambulatory referral to Psychology  . Ambulatory referral to Psychiatry     Plan: Please see assessment and plan per problem list above.   Meds ordered this encounter  Medications  . DULoxetine (CYMBALTA) 20 MG capsule    Sig: Take 1 capsule (20 mg total) by mouth daily.     Dispense:  30 capsule    Refill:  0    Order Specific Question:   Supervising Provider    Answer:   SIMPSON, MARGARET E [[id number]]  . sertraline (ZOLOFT) 25 MG tablet    Sig: Take 1 tablet (25 mg total) by mouth daily.    Dispense:  30 tablet    Refill:  1    Order Specific Question:   Supervising Provider    Answer:   Fayrene Helper [[id number]]    Patient to follow-up in 4 weeks  Perlie Mayo, NP

## 2020-01-26 LAB — CMP14+EGFR
ALT: 16 IU/L (ref 0–32)
AST: 18 IU/L (ref 0–40)
Albumin/Globulin Ratio: 2.5 — ABNORMAL HIGH (ref 1.2–2.2)
Albumin: 4.9 g/dL (ref 3.8–4.9)
Alkaline Phosphatase: 92 IU/L (ref 48–121)
BUN/Creatinine Ratio: 9 (ref 9–23)
BUN: 8 mg/dL (ref 6–24)
Bilirubin Total: 0.4 mg/dL (ref 0.0–1.2)
CO2: 23 mmol/L (ref 20–29)
Calcium: 9.7 mg/dL (ref 8.7–10.2)
Chloride: 99 mmol/L (ref 96–106)
Creatinine, Ser: 0.92 mg/dL (ref 0.57–1.00)
GFR calc Af Amer: 82 mL/min/{1.73_m2} (ref >59)
GFR calc non Af Amer: 71 mL/min/{1.73_m2} (ref >59)
Globulin, Total: 2 g/dL (ref 1.5–4.5)
Glucose: 110 mg/dL — ABNORMAL HIGH (ref 65–99)
Potassium: 4.7 mmol/L (ref 3.5–5.2)
Sodium: 137 mmol/L (ref 134–144)
Total Protein: 6.9 g/dL (ref 6.0–8.5)

## 2020-01-26 LAB — LIPID PANEL
Chol/HDL Ratio: 4.6 ratio — ABNORMAL HIGH (ref 0.0–4.4)
Cholesterol, Total: 252 mg/dL — ABNORMAL HIGH (ref 100–199)
HDL: 55 mg/dL (ref >39)
LDL Chol Calc (NIH): 172 mg/dL — ABNORMAL HIGH (ref 0–99)
Triglycerides: 141 mg/dL (ref 0–149)
VLDL Cholesterol Cal: 25 mg/dL (ref 5–40)

## 2020-01-26 LAB — CBC
Hematocrit: 43.9 % (ref 34.0–46.6)
Hemoglobin: 15.1 g/dL (ref 11.1–15.9)
MCH: 30.3 pg (ref 26.6–33.0)
MCHC: 34.4 g/dL (ref 31.5–35.7)
MCV: 88 fL (ref 79–97)
Platelets: 291 10*3/uL (ref 150–450)
RBC: 4.98 x10E6/uL (ref 3.77–5.28)
RDW: 14 % (ref 11.7–15.4)
WBC: 5.5 10*3/uL (ref 3.4–10.8)

## 2020-01-26 LAB — VITAMIN D 25 HYDROXY (VIT D DEFICIENCY, FRACTURES): Vit D, 25-Hydroxy: 29.2 ng/mL — ABNORMAL LOW (ref 30.0–100.0)

## 2020-01-26 LAB — TSH: TSH: 17.8 u[IU]/mL — ABNORMAL HIGH (ref 0.450–4.500)

## 2020-01-28 ENCOUNTER — Telehealth: Payer: Self-pay | Admitting: Family Medicine

## 2020-01-28 NOTE — Telephone Encounter (Signed)
MED CHANGES [facility] HASNT GOT ANYTHING FROM [place]

## 2020-01-28 NOTE — Telephone Encounter (Signed)
[place] called wanted to make sure you were aware of the interactions between doxepine and sertaline that was sent in for pt

## 2020-01-29 ENCOUNTER — Other Ambulatory Visit: Payer: Self-pay | Admitting: Family Medicine

## 2020-01-29 ENCOUNTER — Other Ambulatory Visit: Payer: Self-pay | Admitting: *Deleted

## 2020-01-29 DIAGNOSIS — E039 Hypothyroidism, unspecified: Secondary | ICD-10-CM | POA: Diagnosis not present

## 2020-01-29 NOTE — Telephone Encounter (Signed)
I am familiar with this. However, she has trouble sleeping and significant PTSD.  Please review with them if the dosage is ok for short term, or if they recommend me going ahead and trying to change her doxepin as well now. Thank you

## 2020-01-29 NOTE — Telephone Encounter (Signed)
Per conversation with Jarrett Soho I have called [facility] and told them to hold all prescription for now and not fill them

## 2020-01-30 ENCOUNTER — Other Ambulatory Visit: Payer: Self-pay | Admitting: Family Medicine

## 2020-01-30 ENCOUNTER — Other Ambulatory Visit: Payer: Self-pay | Admitting: *Deleted

## 2020-01-30 DIAGNOSIS — I10 Essential (primary) hypertension: Secondary | ICD-10-CM

## 2020-01-30 DIAGNOSIS — E039 Hypothyroidism, unspecified: Secondary | ICD-10-CM

## 2020-01-30 LAB — TSH: TSH: 20 u[IU]/mL — ABNORMAL HIGH (ref 0.450–4.500)

## 2020-01-30 MED ORDER — LEVOTHYROXINE SODIUM 125 MCG PO TABS: 125.0000 ug | ORAL_TABLET | Freq: Every evening | ORAL | 0 refills | Status: DC

## 2020-02-01 ENCOUNTER — Ambulatory Visit
Admission: RE | Admit: 2020-02-01 | Discharge: 2020-02-01 | Disposition: A | Discharge status: Home / Self Care | Payer: Medicare HMO | Source: Ambulatory Visit | Attending: Family Medicine | Admitting: Family Medicine

## 2020-02-01 ENCOUNTER — Other Ambulatory Visit: Payer: Self-pay

## 2020-02-01 DIAGNOSIS — Z1231 Encounter for screening mammogram for malignant neoplasm of breast: Secondary | ICD-10-CM | POA: Diagnosis not present

## 2020-02-05 ENCOUNTER — Other Ambulatory Visit: Payer: Self-pay | Admitting: Family Medicine

## 2020-02-05 ENCOUNTER — Telehealth: Payer: Self-pay | Admitting: *Deleted

## 2020-02-05 DIAGNOSIS — E782 Mixed hyperlipidemia: Secondary | ICD-10-CM

## 2020-02-05 DIAGNOSIS — M5416 Radiculopathy, lumbar region: Secondary | ICD-10-CM | POA: Diagnosis not present

## 2020-02-05 MED ORDER — ATORVASTATIN CALCIUM 80 MG PO TABS: 80.0000 mg | ORAL_TABLET | Freq: Every day | ORAL | 0 refills | Status: DC

## 2020-02-05 NOTE — Telephone Encounter (Signed)
Pt called stated that Jarrett Soho was going to take her off of medications and switch to zoloft. She had talked to the therapist and they said they would not do any medication changes that would be up to PCP so she is wondering what you wanted to do as far as her medications were concerned

## 2020-02-05 NOTE — Telephone Encounter (Signed)
He has psychiatry who she is also being set up with I believe in November will be the ones who would adjust her medications.  I am trying to help her get into therapy and do slow adjustments to see if they are not beneficial.  If she would like to talk to me sooner than her next appointment we could look at doing some increases in Zoloft if she feels like the dose that we switched her to is not as beneficial for her as she thought.

## 2020-02-05 NOTE — Telephone Encounter (Signed)
Pt stated she has not started zoloft yet as this was not at the pharmacy. I called [facility] to check and see they do not have this medication. Victoria Deleon had asked to hold the prescription until her blood work came back. Please advise?

## 2020-02-06 ENCOUNTER — Other Ambulatory Visit: Payer: Self-pay | Admitting: Family Medicine

## 2020-02-06 DIAGNOSIS — F419 Anxiety disorder, unspecified: Secondary | ICD-10-CM

## 2020-02-06 MED ORDER — SERTRALINE HCL 25 MG PO TABS: 25.0000 mg | ORAL_TABLET | Freq: Every day | ORAL | 1 refills | Status: DC

## 2020-02-06 NOTE — Telephone Encounter (Signed)
I have stopped her Doxepin (please have her take one every other day for 1 week to taper off). Then start her Zoloft at 25 mg daily.  Make sure she is not taking Celexa anymore.  We will look at Cymbalta adjustments in near future.

## 2020-02-06 NOTE — Telephone Encounter (Signed)
Pt advised of med changes with verbal understanding

## 2020-02-11 ENCOUNTER — Encounter: Payer: Self-pay | Admitting: Adult Health

## 2020-02-11 ENCOUNTER — Other Ambulatory Visit: Payer: Self-pay

## 2020-02-11 ENCOUNTER — Ambulatory Visit: Payer: Medicare HMO | Admitting: Adult Health

## 2020-02-11 VITALS — BP 128/73 | Ht 61.0 in | Wt 173.0 lb

## 2020-02-11 DIAGNOSIS — L900 Lichen sclerosus et atrophicus: Secondary | ICD-10-CM | POA: Diagnosis not present

## 2020-02-11 DIAGNOSIS — N952 Postmenopausal atrophic vaginitis: Secondary | ICD-10-CM | POA: Diagnosis not present

## 2020-02-11 DIAGNOSIS — Z87440 Personal history of urinary (tract) infections: Secondary | ICD-10-CM

## 2020-02-11 DIAGNOSIS — M545 Low back pain, unspecified: Secondary | ICD-10-CM | POA: Insufficient documentation

## 2020-02-11 DIAGNOSIS — Z1211 Encounter for screening for malignant neoplasm of colon: Secondary | ICD-10-CM

## 2020-02-11 DIAGNOSIS — Z01419 Encounter for gynecological examination (general) (routine) without abnormal findings: Secondary | ICD-10-CM | POA: Diagnosis not present

## 2020-02-11 LAB — POCT URINALYSIS DIPSTICK
Blood, UA: NEGATIVE
Glucose, UA: NEGATIVE
Ketones, UA: NEGATIVE
Leukocytes, UA: NEGATIVE
Nitrite, UA: NEGATIVE
Protein, UA: NEGATIVE

## 2020-02-11 LAB — HEMOCCULT GUIAC POC 1CARD (OFFICE): Fecal Occult Blood, POC: NEGATIVE

## 2020-02-11 MED ORDER — CLOBETASOL PROPIONATE 0.05 % EX CREA: 1.0000 "application " | TOPICAL_CREAM | Freq: Two times a day (BID) | CUTANEOUS | 1 refills | Status: DC

## 2020-02-11 MED ORDER — PREMARIN 0.625 MG/GM VA CREA: TOPICAL_CREAM | VAGINAL | 0 refills | Status: DC

## 2020-02-11 NOTE — Progress Notes (Signed)
Patient ID: Victoria Deleon, female   DOB: 1965/08/06, 54 y.o.   MRN: [id number] History of Present Illness: Victoria Deleon is a 54 year old white female, married, sp hysterectomy 20 years ago for endometriosis. She is in today for pelvic exam. She has UTIs like once a month she says and has low back pain that she is currently getting injections. PCP is Cherly Beach NP.    Current Medications, Allergies, Past Medical History, Past Surgical History, Family History and Social History were reviewed in [employer] record.     Review of Systems: Patient denies any headaches, hearing loss, fatigue, blurred vision, shortness of breath, chest pain, abdominal pain, problems with bowel movements, or intercourse(not active). No mood swings. See HPI for positives.   Physical Exam:BP 128/73 (BP Location: Left Arm, Patient Position: Sitting, Cuff Size: Normal)   Ht 5' 1"  (1.549 m)   Wt 173 lb (78.5 kg)   BMI 32.69 kg/m urine dipstick is negative. General:  Well developed, well nourished, no acute distress Skin:  Warm and dry Lungs; Clear to auscultation bilaterally Cardiovascular: Regular rate and rhythm Pelvic:  External genitalia has pale thin skin near base of labia, extends to near rectum.  The vagina is pale, and dry with loss of rugae. Urethra has no lesions or masses. The cervix and uterus are absent.  No adnexal masses or tenderness noted.Bladder is non tender, no masses felt. Rectal: Good sphincter tone, no polyps, + hemorrhoids felt.  Hemoccult negative. Psych:  No mood changes, alert and cooperative,seems happy AA is 1 Fall risk is low PHQ 9 score is 13, no SI,is on meds   Upstream - 02/11/20 0956      Pregnancy Intention Screening   Does the patient want to become pregnant in the next year? N/A   hysterectomy   Does the patient's partner want to become pregnant in the next year? N/A    Would the patient like to discuss contraceptive options today? N/A       Contraception Wrap Up   Current Method No Method - Other Reason    End Method No Method - Other Reason    Contraception Counseling Provided No         Examination chaperoned by Dwyane Dee LPN  Impression and Plan: 1. Low back pain, unspecified back pain laterality, unspecified chronicity, unspecified whether sciatica present   2. History of recurrent UTIs Will try PVC  3. Encounter for well woman exam with routine gynecological exam Physical yearly with PCP Labs with PCP Mammogram yearly Colonoscopy per GI   4. Encounter for screening fecal occult blood testing   5. Vaginal atrophy Use Premarin vaginal cream 0.5 gm daily for 2 weeks then 2-3 x weekly, 5 4 gm tubes given  Meds ordered this encounter  Medications  . clobetasol cream (TEMOVATE) 0.05 %    Sig: Apply 1 application topically 2 (two) times daily. Use bid for 2 weeks then daily till seen    Dispense:  30 g    Refill:  1    Order Specific Question:   Supervising Provider    Answer:   Elonda Husky, LUTHER H [[id number]]  . conjugated estrogens (PREMARIN) vaginal cream    Sig: Use 0.5 gm in vagina daily for 2 weeks then 2-3 x weekly    Dispense:  20 g    Refill:  0    Order Specific Question:   Supervising Provider    Answer:   Tania Ade H [[id number]]  -  follow up in 4 weeks for recheck  6. Lichen sclerosus Will try temovate 2 x daily for 2 weeks then daily till seen

## 2020-02-22 LAB — T4, FREE: Free T4: 0.77 ng/dL — ABNORMAL LOW (ref 0.82–1.77)

## 2020-02-22 LAB — SPECIMEN STATUS REPORT

## 2020-02-22 LAB — T3, FREE: T3, Free: 2.3 pg/mL (ref 2.0–4.4)

## 2020-02-26 ENCOUNTER — Ambulatory Visit: Payer: Medicare HMO | Admitting: Family Medicine

## 2020-03-04 ENCOUNTER — Other Ambulatory Visit: Payer: Self-pay

## 2020-03-04 ENCOUNTER — Telehealth (INDEPENDENT_AMBULATORY_CARE_PROVIDER_SITE_OTHER): Payer: Medicare HMO | Admitting: Family Medicine

## 2020-03-04 DIAGNOSIS — F419 Anxiety disorder, unspecified: Secondary | ICD-10-CM | POA: Diagnosis not present

## 2020-03-04 DIAGNOSIS — M5416 Radiculopathy, lumbar region: Secondary | ICD-10-CM | POA: Diagnosis not present

## 2020-03-04 MED ORDER — SERTRALINE HCL 50 MG PO TABS: 50.0000 mg | ORAL_TABLET | Freq: Every day | ORAL | 3 refills | Status: DC

## 2020-03-04 MED ORDER — BUSPIRONE HCL 7.5 MG PO TABS: 7.5000 mg | ORAL_TABLET | Freq: Two times a day (BID) | ORAL | 3 refills | Status: DC

## 2020-03-04 NOTE — Assessment & Plan Note (Signed)
Depression has improved, however she is having increased anxiety. This could be related to the transition of celexa to zoloft, as well has weaning from cymbalta.  Will increase Zoloft to 50 mg and start buspar to help.  She is agreeable to this plan. Denies SI and HI

## 2020-03-04 NOTE — Patient Instructions (Signed)
  HAPPY FALL!  I appreciate the opportunity to provide you with care for your health and wellness. Today we discussed: TSh level recheck next week and med changes for mood   Follow up: 6-8 weeks   No labs or referrals today  Please get thyroid level checked next week  Please start new doses of Zoloft 50 mg and Buspar as directed.  Call before appt if needed  Please continue to practice social distancing to keep you, your family, and our community safe.  If you must go out, please wear a mask and practice good handwashing.  It was a pleasure to see you and I look forward to continuing to work together on your health and well-being. Please do not hesitate to call the office if you need care or have questions about your care.  Have a wonderful day and week. With Gratitude, Cherly Beach, DNP, AGNP-BC

## 2020-03-04 NOTE — Progress Notes (Signed)
Virtual Visit via Telephone Note   This visit type was conducted due to national recommendations for restrictions regarding the COVID-19 Pandemic (e.g. social distancing) in an effort to limit this patient's exposure and mitigate transmission in our community.  Due to her co-morbid illnesses, this patient is at least at moderate risk for complications without adequate follow up.  This format is felt to be most appropriate for this patient at this time.  The patient did not have access to video technology/had technical difficulties with video requiring transitioning to audio format only (telephone).  All issues noted in this document were discussed and addressed.  No physical exam could be performed with this format.   Evaluation Performed:  Follow-up visit  Date:  03/04/2020   ID:  Victoria Deleon, DOB 1965/06/30, MRN [id number]  Patient Location: Home Provider Location: Office/Clinic  Location of Patient: Home Location of Provider: Telehealth Consent was obtain for visit to be over via telehealth. I verified that I am speaking with the correct person using two identifiers.  PCP:  Perlie Mayo, NP   Chief Complaint:  Mood-anixety med changes   History of Present Illness:    Victoria Deleon is a 54 y.o. female with history as stated below.  Follows up today with anxiety med changes.  I had transitioned her from Celexa to Zoloft with a wean off of Cymbalta as well.  She continues to be on her doxepin.  I was worried that she might not be treated as well as her PTSD and that could be a better focus for treatment.  She is willing to have an increased dose of her Zoloft as she does not feel like it is currently working at this time.  Her depression has improved but her anxiety has worsened.  Most likely related to the transition at this time.  She is also willing to start BuSpar and follow-up closely.  Denies having any SI or HI.  The patient does not have symptoms concerning for  COVID-19 infection (fever, chills, cough, or new shortness of breath).   Past Medical, Surgical, Social History, Allergies, and Medications have been Reviewed.  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 Gala Romney: Normal terminal ileum with negative biopsies, normal-appearing colon with random colon biopsies negative, internal hemorrhoids.  Next colonoscopy 10 years.   ESOPHAGOGASTRODUODENOSCOPY (EGD) WITH PROPOFOL N/A 04/24/2018   Dr. Gala Romney: Severe ulcerative reflux esophagitis, small hiatal hernia, small bowel biopsies negative for celiac.   EYE SURGERY N/A    Phreesia 03/04/2020   JOINT REPLACEMENT N/A  Phreesia 01/22/2020   MEDIAL PARTIAL KNEE REPLACEMENT Left    TOTAL KNEE REVISION Left 09/10/2019   Procedure: LEFT TOTAL KNEE REVISION;  Surgeon: Frederik Pear, MD;  Location: [facility];  Service: Orthopedics;  Laterality: Left;     Current Meds  Medication Sig   aspirin EC 81 MG tablet Take 1 tablet (81 mg total) by mouth 2 (two) times daily.   atorvastatin (LIPITOR) 80 MG tablet Take 1 tablet (80 mg total) by mouth at bedtime.   clobetasol cream  (TEMOVATE) 1.88 % Apply 1 application topically 2 (two) times daily. Use bid for 2 weeks then daily till seen   conjugated estrogens (PREMARIN) vaginal cream Use 0.5 gm in vagina daily for 2 weeks then 2-3 x weekly   doxepin (SINEQUAN) 10 MG capsule Take 10 mg by mouth.   DULoxetine (CYMBALTA) 20 MG capsule Take 1 capsule (20 mg total) by mouth daily.   FIBER PO Take 1 capsule by mouth 2 (two) times daily.    gabapentin (NEURONTIN) 100 MG capsule Take 1 capsule (100 mg total) by mouth 3 (three) times daily.   levothyroxine (SYNTHROID) 125 MCG tablet Take 1 tablet (125 mcg total) by mouth every evening.   linaclotide (LINZESS) 72 MCG capsule Take 1 capsule (72 mcg total) by mouth daily.   metoprolol succinate (TOPROL-XL) 25 MG 24 hr tablet Take 1 tablet (25 mg total) by mouth daily.   Multiple Vitamin (MULTIVITAMIN WITH MINERALS) TABS tablet Take 1 tablet by mouth daily.   pantoprazole (PROTONIX) 40 MG tablet Take 1 tablet (40 mg total) by mouth daily.   sertraline (ZOLOFT) 25 MG tablet Take 1 tablet (25 mg total) by mouth daily.   tiZANidine (ZANAFLEX) 2 MG tablet Take 1 tablet (2 mg total) by mouth every 6 (six) hours as needed.     Allergies:   Codeine   ROS:   Please see the history of present illness.     All other systems reviewed and are negative.   Labs/Other Tests and Data Reviewed:    Recent Labs: 01/25/2020: ALT 16; BUN 8; Creatinine, Ser 0.92; Hemoglobin 15.1; Platelets 291; Potassium 4.7; Sodium 137 01/29/2020: TSH 20.000   Recent Lipid Panel Lab Results  Component Value Date/Time   CHOL 252 (H) 01/25/2020 10:24 AM   TRIG 141 01/25/2020 10:24 AM   HDL 55 01/25/2020 10:24 AM   CHOLHDL 4.6 (H) 01/25/2020 10:24 AM   LDLCALC 172 (H) 01/25/2020 10:24 AM    Wt Readings from Last 3 Encounters:  02/11/20 173 lb (78.5 kg)  01/25/20 171 lb 12.8 oz (77.9 kg)  09/10/19 162 lb 14.7 oz (73.9 kg)     Objective:    Vital Signs:  There were no vitals taken for this  visit.   VITAL SIGNS:  reviewed GEN:  no acute distress RESPIRATORY:  no shortness of breath in conversation  PSYCH:  down and flat mood    Depression screen [facility] 2/9 03/04/2020 02/11/2020 01/25/2020  Decreased Interest 0 3 0  Down, Depressed, Hopeless 0 0 0  PHQ - 2 Score 0 3 0  Altered sleeping - 3 -  Tired, decreased energy - 3 -  Change in appetite - 3 -  Feeling bad or failure about yourself  - 0 -  Trouble concentrating - 1 -  Moving slowly or fidgety/restless - 0 -  Suicidal thoughts - 0 -  PHQ-9 Score - 13 -  Difficult doing work/chores - Somewhat difficult -   GAD 7 : Generalized  Anxiety Score 03/04/2020 02/11/2020 01/25/2020  Nervous, Anxious, on Edge 3 3 3   Control/stop worrying 3 3 3   Worry too much - different things 3 3 3   Trouble relaxing 3 3 3   Restless 3 3 3   Easily annoyed or irritable 3 0 3  Afraid - awful might happen 3 0 3  Total GAD 7 Score 21 15 21   Anxiety Difficulty Somewhat difficult Not difficult at all Very difficult    ASSESSMENT & PLAN:    1. Anxiety  - sertraline (ZOLOFT) 50 MG tablet; Take 1 tablet (50 mg total) by mouth daily.  Dispense: 30 tablet; Refill: 3 - busPIRone (BUSPAR) 7.5 MG tablet; Take 1 tablet (7.5 mg total) by mouth 2 (two) times daily.  Dispense: 60 tablet; Refill: 3   Time:   Today, I have spent 10 minutes with the patient with telehealth technology discussing the above problems.     Medication Adjustments/Labs and Tests Ordered: Current medicines are reviewed at length with the patient today.  Concerns regarding medicines are outlined above.   Tests Ordered: No orders of the defined types were placed in this encounter.   Medication Changes: No orders of the defined types were placed in this encounter.   Disposition:  Follow up 6 -8 weeks  Signed, Perlie Mayo, NP  03/04/2020 3:48 PM     [facility]

## 2020-03-06 ENCOUNTER — Telehealth: Payer: Self-pay

## 2020-03-06 NOTE — Telephone Encounter (Signed)
Pt is calling regarding medication Sertraline.Victoria Deleon

## 2020-03-06 NOTE — Telephone Encounter (Signed)
LVM for pt to call the office.

## 2020-03-07 ENCOUNTER — Telehealth: Payer: Self-pay | Admitting: Family Medicine

## 2020-03-07 NOTE — Telephone Encounter (Signed)
Patient returned your call in regards to medications [phone]

## 2020-03-07 NOTE — Telephone Encounter (Signed)
LVM for pt to call the office regarding medications

## 2020-03-10 ENCOUNTER — Encounter: Payer: Self-pay | Admitting: Adult Health

## 2020-03-10 ENCOUNTER — Ambulatory Visit: Payer: Medicare HMO | Admitting: Adult Health

## 2020-03-10 VITALS — BP 111/72 | HR 84 | Ht 61.0 in | Wt 174.0 lb

## 2020-03-10 DIAGNOSIS — L900 Lichen sclerosus et atrophicus: Secondary | ICD-10-CM | POA: Diagnosis not present

## 2020-03-10 DIAGNOSIS — N952 Postmenopausal atrophic vaginitis: Secondary | ICD-10-CM | POA: Diagnosis not present

## 2020-03-10 DIAGNOSIS — E039 Hypothyroidism, unspecified: Secondary | ICD-10-CM | POA: Diagnosis not present

## 2020-03-10 MED ORDER — CLOBETASOL PROPIONATE 0.05 % EX CREA: 1.0000 "application " | TOPICAL_CREAM | Freq: Two times a day (BID) | CUTANEOUS | 1 refills | Status: DC

## 2020-03-10 NOTE — Progress Notes (Signed)
°  Subjective:     Patient ID: Victoria Deleon, female   DOB: [birth year], 54 y.o.   MRN: [id number]  HPI Victoria Deleon is a 54 year old white female,married, sp hysterectomy back in follow up on using PVC and clobetasol, is using PVC but clobetasol was too expensive, so has not gotten it yet. PCP is [employer] NP.  Review of Systems Has not seen much change since using PVC, has not gotten clobetasol yet was $80 at [place] past medical,surgical, social and family history. Reviewed medications and allergies.     Objective:   Physical Exam BP 111/72 (BP Location: Left Arm, Patient Position: Sitting, Cuff Size: Normal)    Pulse 84    Ht 5' 1"  (1.549 m)    Wt 174 lb (78.9 kg)    BMI 32.88 kg/m    Skin warm and dry. Lungs: clear to ausculation bilaterally. Cardiovascular: regular rate and rhythm. Assessment:     1. Vaginal atrophy Continue PVC   2. Lichen sclerosus Get clob[facility]asol for 28.41 at [facility] and start using now    Plan:     Follow up in 2 weeks for pelvic exam

## 2020-03-11 ENCOUNTER — Other Ambulatory Visit: Payer: Self-pay | Admitting: Family Medicine

## 2020-03-11 DIAGNOSIS — E039 Hypothyroidism, unspecified: Secondary | ICD-10-CM

## 2020-03-11 LAB — TSH: TSH: 32.8 u[IU]/mL — ABNORMAL HIGH (ref 0.450–4.500)

## 2020-03-11 MED ORDER — LEVOTHYROXINE SODIUM 150 MCG PO TABS: 150.0000 ug | ORAL_TABLET | Freq: Every evening | ORAL | 1 refills | Status: DC

## 2020-03-24 ENCOUNTER — Ambulatory Visit: Payer: Medicare HMO | Admitting: Adult Health

## 2020-03-24 DIAGNOSIS — M5416 Radiculopathy, lumbar region: Secondary | ICD-10-CM | POA: Diagnosis not present

## 2020-03-24 DIAGNOSIS — M47816 Spondylosis without myelopathy or radiculopathy, lumbar region: Secondary | ICD-10-CM | POA: Diagnosis not present

## 2020-03-25 ENCOUNTER — Ambulatory Visit: Payer: Medicare HMO | Admitting: Adult Health

## 2020-03-25 NOTE — Telephone Encounter (Signed)
Unable to reach pt to discuss

## 2020-03-27 ENCOUNTER — Other Ambulatory Visit: Payer: Self-pay

## 2020-03-27 DIAGNOSIS — F419 Anxiety disorder, unspecified: Secondary | ICD-10-CM

## 2020-03-27 DIAGNOSIS — M47816 Spondylosis without myelopathy or radiculopathy, lumbar region: Secondary | ICD-10-CM

## 2020-03-27 MED ORDER — GABAPENTIN 100 MG PO CAPS: 100.0000 mg | ORAL_CAPSULE | Freq: Three times a day (TID) | ORAL | 2 refills | Status: DC

## 2020-03-27 MED ORDER — DULOXETINE HCL 20 MG PO CPEP: 20.0000 mg | ORAL_CAPSULE | Freq: Every day | ORAL | 0 refills | Status: DC

## 2020-04-03 ENCOUNTER — Telehealth: Payer: Self-pay

## 2020-04-03 NOTE — Telephone Encounter (Signed)
Pt informed

## 2020-04-03 NOTE — Telephone Encounter (Signed)
Pt has had sinus congestion, cough, and earache for 4-5 days. No fever, no body aches. She has tried several things otc. Mucinex and Tussinex. Wants to know if there is anything else you advise?

## 2020-04-03 NOTE — Telephone Encounter (Signed)
If she does not start to feel better by next week we can add on for phone visit. Strongly recommend COVID test. Increase water and vitamin C as well.

## 2020-04-14 ENCOUNTER — Other Ambulatory Visit: Payer: Self-pay

## 2020-04-14 DIAGNOSIS — E039 Hypothyroidism, unspecified: Secondary | ICD-10-CM | POA: Diagnosis not present

## 2020-04-15 ENCOUNTER — Encounter: Payer: Self-pay | Admitting: Family Medicine

## 2020-04-15 ENCOUNTER — Encounter: Payer: Medicare HMO | Admitting: Family Medicine

## 2020-04-15 ENCOUNTER — Ambulatory Visit (INDEPENDENT_AMBULATORY_CARE_PROVIDER_SITE_OTHER): Payer: Medicare HMO | Admitting: Family Medicine

## 2020-04-15 ENCOUNTER — Other Ambulatory Visit: Payer: Self-pay

## 2020-04-15 DIAGNOSIS — E039 Hypothyroidism, unspecified: Secondary | ICD-10-CM | POA: Diagnosis not present

## 2020-04-15 LAB — TSH: TSH: 31.1 u[IU]/mL — ABNORMAL HIGH (ref 0.450–4.500)

## 2020-04-15 MED ORDER — LEVOTHYROXINE SODIUM 175 MCG PO TABS: 175.0000 ug | ORAL_TABLET | Freq: Every evening | ORAL | 1 refills | Status: DC

## 2020-04-15 NOTE — Assessment & Plan Note (Signed)
I have had a difficult time with getting TSH level to come under control. She reports taking medication as directed. She has S&S of uncontrolled thyroid. Referral to Endo for work up.

## 2020-04-15 NOTE — Progress Notes (Signed)
Virtual Visit via Telephone Note   This visit type was conducted due to national recommendations for restrictions regarding the COVID-19 Pandemic (e.g. social distancing) in an effort to limit this patient's exposure and mitigate transmission in our community.  Due to her co-morbid illnesses, this patient is at least at moderate risk for complications without adequate follow up.  This format is felt to be most appropriate for this patient at this time.  The patient did not have access to video technology/had technical difficulties with video requiring transitioning to audio format only (telephone).  All issues noted in this document were discussed and addressed.  No physical exam could be performed with this format.    Evaluation Performed:  Follow-up visit  Date:  04/15/2020   ID:  Victoria Deleon, DOB [birth year], MRN [id number]  Patient Location: Home Provider Location: Office/Clinic  Location of Patient: Home Location of Provider: Telehealth Consent was obtain for visit to be over via telehealth. I verified that I am speaking with the correct person using two identifiers.  PCP:  Perlie Mayo, NP   Chief Complaint:  TSH f/u  History of Present Illness:    Victoria Deleon is a 54 y.o. female with history as stated below. She has a phone visit today to follow up on recent TSH level. We have adjusted her medication several times now without success.  I have mentioned a referral might be good idea, she agrees. She continues to have low energy, headaches and anxiety. Denies chest pain, dizziness, cough or SHOB.  The patient does not have symptoms concerning for COVID-19 infection (fever, chills, cough, or new shortness of breath).   Past Medical, Surgical, Social History, Allergies, and Medications have been Reviewed.  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 Gala Romney: Normal terminal ileum with negative biopsies, normal-appearing colon with random colon biopsies negative, internal hemorrhoids.  Next colonoscopy 10 years.   ESOPHAGOGASTRODUODENOSCOPY (EGD) WITH PROPOFOL N/A 04/24/2018   Dr. Gala Romney: Severe ulcerative reflux esophagitis, small hiatal hernia, small bowel biopsies negative for celiac.   EYE SURGERY N/A    Phreesia 03/04/2020   JOINT REPLACEMENT N/A    Phreesia 01/22/2020   MEDIAL PARTIAL KNEE REPLACEMENT Left    TOTAL KNEE REVISION Left 09/10/2019   Procedure: LEFT TOTAL KNEE REVISION;  Surgeon: Frederik Pear, MD;  Location: [facility];  Service: Orthopedics;  Laterality: Left;     Current Meds  Medication Sig   aspirin EC 81 MG tablet Take 1 tablet (81 mg total) by mouth 2 (two) times daily.  atorvastatin (LIPITOR) 80 MG tablet Take 1 tablet (80 mg total) by mouth at bedtime.   busPIRone (BUSPAR) 7.5 MG tablet Take 1 tablet (7.5 mg total) by mouth 2 (two) times daily.   clobetasol cream (TEMOVATE) 9.62 % Apply 1 application topically 2 (two) times daily. Use bid for 2 weeks then daily till seen   conjugated estrogens (PREMARIN) vaginal cream Use 0.5 gm in vagina daily for 2 weeks then 2-3 x weekly   doxepin  (SINEQUAN) 10 MG capsule Take 10 mg by mouth.   DULoxetine (CYMBALTA) 20 MG capsule Take 1 capsule (20 mg total) by mouth daily.   FIBER PO Take 1 capsule by mouth 2 (two) times daily.    gabapentin (NEURONTIN) 100 MG capsule Take 1 capsule (100 mg total) by mouth 3 (three) times daily.   levothyroxine (SYNTHROID) 150 MCG tablet Take 1 tablet (150 mcg total) by mouth every evening. Please take on an empty stomach 1 hour prior to food.   linaclotide (LINZESS) 72 MCG capsule Take 1 capsule (72 mcg total) by mouth daily.   metoprolol succinate (TOPROL-XL) 25 MG 24 hr tablet Take 1 tablet (25 mg total) by mouth daily.   Multiple Vitamin (MULTIVITAMIN WITH MINERALS) TABS tablet Take 1 tablet by mouth daily.   pantoprazole (PROTONIX) 40 MG tablet Take 1 tablet (40 mg total) by mouth daily.   sertraline (ZOLOFT) 50 MG tablet Take 1 tablet (50 mg total) by mouth daily.   tiZANidine (ZANAFLEX) 2 MG tablet Take 1 tablet (2 mg total) by mouth every 6 (six) hours as needed.     Allergies:   Codeine   ROS:   Please see the history of present illness.    All other systems reviewed and are negative.   Labs/Other Tests and Data Reviewed:    Recent Labs: 01/25/2020: ALT 16; BUN 8; Creatinine, Ser 0.92; Hemoglobin 15.1; Platelets 291; Potassium 4.7; Sodium 137 04/14/2020: TSH 31.100   Recent Lipid Panel Lab Results  Component Value Date/Time   CHOL 252 (H) 01/25/2020 10:24 AM   TRIG 141 01/25/2020 10:24 AM   HDL 55 01/25/2020 10:24 AM   CHOLHDL 4.6 (H) 01/25/2020 10:24 AM   LDLCALC 172 (H) 01/25/2020 10:24 AM    Wt Readings from Last 3 Encounters:  03/10/20 174 lb (78.9 kg)  02/11/20 173 lb (78.5 kg)  01/25/20 171 lb 12.8 oz (77.9 kg)     Objective:    Vital Signs:  There were no vitals taken for this visit.   VITAL SIGNS:  reviewed GEN:  no acute distress RESPIRATORY:  no shortness of breath in conversation  PSYCH:  normal affect    ASSESSMENT & PLAN:    1.  Hypothyroidism, unspecified type  - levothyroxine (SYNTHROID) 175 MCG tablet; Take 1 tablet (175 mcg total) by mouth every evening. Please take on an empty stomach 1 hour prior to food.  Dispense: 30 tablet; Refill: 1 - Ambulatory referral to Endocrinology    Time:   Today, I have spent 5 minutes with the patient with telehealth technology discussing the above problems.     Medication Adjustments/Labs and Tests Ordered: Current medicines are reviewed at length with the patient today.  Concerns regarding medicines are outlined above.   Tests Ordered: No orders of the defined types were placed in this encounter.   Medication Changes: No orders of the defined types were placed in this encounter.    Note: This dictation was prepared with Dragon dictation along with smaller phrase technology.  Similar sounding words can be transcribed inadequately or may not be corrected upon review. Any transcriptional errors that result from this process are unintentional.      Disposition:  Follow up Aug 2022 for CPE- fasting appt  Signed, Perlie Mayo, NP  04/15/2020 2:11 PM     [facility]

## 2020-04-15 NOTE — Patient Instructions (Signed)
  HAPPY FALL!  I appreciate the opportunity to provide you with care for your health and wellness. Today we discussed: thyroid   Follow up: Aug 2022 for CPE -fasting appt for labs same day   No labs  Referrals today to Endocrinology for thyroid.  Increase dose of medication until can be seen.   Please continue to practice social distancing to keep you, your family, and our community safe.  If you must go out, please wear a mask and practice good handwashing.  It was a pleasure to see you and I look forward to continuing to work together on your health and well-being. Please do not hesitate to call the office if you need care or have questions about your care.  Have a wonderful day and week. With Gratitude, Cherly Beach, DNP, AGNP-BC

## 2020-04-15 NOTE — Progress Notes (Signed)
Duplicate note-  This encounter was created in error - please disregard.

## 2020-04-16 ENCOUNTER — Ambulatory Visit: Payer: Medicare HMO | Admitting: Adult Health

## 2020-04-16 ENCOUNTER — Ambulatory Visit: Payer: Medicare HMO | Admitting: Nurse Practitioner

## 2020-04-16 ENCOUNTER — Encounter: Payer: Self-pay | Admitting: Adult Health

## 2020-04-16 ENCOUNTER — Other Ambulatory Visit: Payer: Self-pay

## 2020-04-16 ENCOUNTER — Encounter: Payer: Self-pay | Admitting: Nurse Practitioner

## 2020-04-16 VITALS — BP 134/79 | HR 67 | Ht 61.0 in | Wt 169.2 lb

## 2020-04-16 VITALS — BP 134/89 | HR 72 | Ht 61.0 in

## 2020-04-16 DIAGNOSIS — R399 Unspecified symptoms and signs involving the genitourinary system: Secondary | ICD-10-CM | POA: Diagnosis not present

## 2020-04-16 DIAGNOSIS — E039 Hypothyroidism, unspecified: Secondary | ICD-10-CM

## 2020-04-16 DIAGNOSIS — L900 Lichen sclerosus et atrophicus: Secondary | ICD-10-CM | POA: Diagnosis not present

## 2020-04-16 DIAGNOSIS — N952 Postmenopausal atrophic vaginitis: Secondary | ICD-10-CM

## 2020-04-16 LAB — POCT URINALYSIS DIPSTICK OB
Blood, UA: NEGATIVE
Glucose, UA: NEGATIVE
Ketones, UA: NEGATIVE
Leukocytes, UA: NEGATIVE
Nitrite, UA: NEGATIVE
POC,PROTEIN,UA: NEGATIVE

## 2020-04-16 MED ORDER — SYNTHROID 175 MCG PO TABS: 175.0000 ug | ORAL_TABLET | Freq: Every day | ORAL | 3 refills | Status: DC

## 2020-04-16 MED ORDER — SULFAMETHOXAZOLE-TRIMETHOPRIM 800-160 MG PO TABS: 1.0000 | ORAL_TABLET | Freq: Two times a day (BID) | ORAL | 0 refills | Status: DC

## 2020-04-16 NOTE — Progress Notes (Signed)
  Subjective:     Patient ID: Victoria Deleon, female   DOB: 08/02/1965, 54 y.o.   MRN: [id number]  HPI Victoria Deleon is a 54 year old white female,married, sp hysterectomy back for recheck after starting temovate. She is complaining of dark urine and feels tired and frequent urination. PCP is Cherly Beach NP  Review of Systems Dark urine Frequent urination Reviewed past medical,surgical, social and family history. Reviewed medications and allergies.     Objective:   Physical Exam BP 134/79 (BP Location: Right Arm, Patient Position: Sitting, Cuff Size: Normal)   Pulse 67   Ht 5' 1"  (1.549 m)   Wt 169 lb 3.2 oz (76.7 kg)   BMI 31.97 kg/m  urine dipstick was negative, no odor but urine is dark Skin warm and dry.Pelvic: external genitalia is pale with thin skin from labia to rectal area.Vagunal is pinker. Examination chaperoned by Glenard Haring RN    Assessment:     1. Urinary tract infection symptoms Culture urine Will rx septra ds  Meds ordered this encounter  Medications  . sulfamethoxazole-trimethoprim (BACTRIM DS) 800-160 MG tablet    Sig: Take 1 tablet by mouth 2 (two) times daily. Take 1 bid    Dispense:  14 tablet    Refill:  0    Order Specific Question:   Supervising Provider    Answer:   Elonda Husky, LUTHER H [[id number]]    2. Vaginal atrophy Continue PVC  3. Lichen sclerosus Continue temovate     Plan:     Follow up in 8 weeks

## 2020-04-16 NOTE — Patient Instructions (Signed)

## 2020-04-16 NOTE — Progress Notes (Signed)
Endocrinology Consult Note                                         04/16/2020, 2:11 PM   Victoria Deleon is a 54 y.o.-year-old female patient being seen in consultation for hypothyroidism referred by Perlie Mayo, NP.  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 Gala Romney: Normal terminal ileum with negative biopsies, normal-appearing colon with random colon biopsies negative, internal hemorrhoids.  Next colonoscopy 10 years.   ESOPHAGOGASTRODUODENOSCOPY (EGD) WITH PROPOFOL N/A 04/24/2018   Dr. Gala Romney: Severe ulcerative reflux esophagitis, small hiatal hernia, small bowel biopsies negative for celiac.   EYE SURGERY N/A    Phreesia 03/04/2020   JOINT REPLACEMENT N/A    Phreesia 01/22/2020   MEDIAL PARTIAL KNEE REPLACEMENT Left    TOTAL KNEE REVISION Left 09/10/2019   Procedure: LEFT TOTAL KNEE REVISION;  Surgeon: Frederik Pear, MD;  Location: [facility];  Service: Orthopedics;  Laterality: Left;    Social History   Socioeconomic History   Marital status: Married     Spouse name: Fritz Pickerel    Number of children: Not on file   Years of education: Not on file   Highest education level: Not on file  Occupational History    Comment: medical disability   Tobacco Use   Smoking status: Current Every Day Smoker    Packs/day: 0.25    Years: 30.00    Pack years: 7.50    Types: Cigarettes   Smokeless tobacco: Never Used  Vaping Use   Vaping Use: Never used  Substance and Sexual Activity   Alcohol use: Yes    Comment: rare-maybe 2 glasses of wine/year   Drug use: Never   Sexual activity: Not Currently    Birth control/protection: Surgical    Comment: hyst  Other Topics Concern   Not on file  Social History Narrative   Lives with husband Fritz Pickerel          Enjoys: kayaking, outside events  Diet: eats all food groups outside meat    Caffeine: tea 2 cups daily    Water: 5-6 cups daily or more      Wears seat belt   Does not use phone while driving    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, fast food safe area   Social Determinants of Health   Financial Resource Strain: Medium Risk   Difficulty of Paying Living Expenses: Somewhat hard  Food Insecurity: No Food Insecurity   Worried About Charity fundraiser in the Last Year: Never true   Arboriculturist in the Last Year: Never true  Transportation Needs: No Transportation Needs   Lack of Transportation (Medical): No   Lack of Transportation (Non-Medical): No  Physical Activity: Sufficiently Active   Days of Exercise per Week: 4 days   Minutes of Exercise per Session: 50 min  Stress: No Stress Concern Present   Feeling of Stress : Only a little  Social Connections: Socially Isolated   Frequency of Communication with Friends and Family: Never   Frequency of Social Gatherings with Friends and Family: Never   Attends Religious Services: Never   Marine scientist or Organizations: No   Attends Music therapist: Never   Marital Status: Married     Family History  Adopted: Yes  Problem Relation Age of Onset   Colon cancer Neg Hx     Outpatient Encounter Medications as of 04/16/2020  Medication Sig   atorvastatin (LIPITOR) 80 MG tablet Take 1 tablet (80 mg total) by mouth at bedtime.   busPIRone (BUSPAR) 7.5 MG tablet Take 1 tablet (7.5 mg total) by mouth 2 (two) times daily.   clobetasol cream (TEMOVATE) 1.15 % Apply 1 application topically 2 (two) times daily. Use bid for 2 weeks then daily till seen   conjugated estrogens (PREMARIN) vaginal cream Use 0.5 gm in vagina daily for 2 weeks then 2-3 x weekly   doxepin (SINEQUAN) 10 MG capsule Take 10 mg by mouth.   DULoxetine (CYMBALTA) 20 MG capsule Take 1 capsule (20 mg total) by mouth daily.   FIBER PO Take 1 capsule by mouth 2 (two) times daily.    gabapentin (NEURONTIN) 100 MG capsule Take 1 capsule (100 mg total) by mouth 3 (three) times daily.   linaclotide (LINZESS) 72 MCG capsule Take 1 capsule (72 mcg total) by mouth daily.   methocarbamol (ROBAXIN) 500 MG tablet Take 500 mg by mouth 2 (two) times daily.   metoprolol succinate (TOPROL-XL) 25 MG 24 hr tablet Take 1 tablet (25 mg total) by mouth daily.   Multiple Vitamin (MULTIVITAMIN WITH MINERALS) TABS tablet Take 1 tablet by mouth daily.   pantoprazole (PROTONIX) 40 MG tablet Take 1 tablet (40 mg total) by mouth daily.   sertraline (ZOLOFT) 50 MG tablet Take 1 tablet (50 mg total) by mouth daily.   sulfamethoxazole-trimethoprim (BACTRIM DS) 800-160 MG tablet Take 1 tablet by mouth 2 (two) times daily. Take 1 bid   tiZANidine (ZANAFLEX) 2 MG tablet Take 1 tablet (2 mg total) by mouth every 6 (six) hours as needed.   [DISCONTINUED] levothyroxine (SYNTHROID) 175 MCG tablet Take 1 tablet (175 mcg total) by mouth every evening. Please take on an empty stomach 1 hour prior to food.   SYNTHROID 175 MCG tablet Take 1 tablet (175 mcg total) by mouth daily before breakfast.   [DISCONTINUED] aspirin EC 81 MG  tablet Take 1 tablet (81 mg total) by mouth 2 (  two) times daily.   No facility-administered encounter medications on file as of 04/16/2020.    ALLERGIES: Allergies  Allergen Reactions   Codeine Anaphylaxis   VACCINATION STATUS: Immunization History  Administered Date(s) Administered   Moderna SARS-COVID-2 Vaccination 11/15/2019, 01/04/2020     HPI    Victoria Deleon  is a patient with the above medical history. she was diagnosed with hypothyroidism at approximate age of 54 years, which required subsequent initiation of Levothyroxine therapy. she was given various doses of thyroid hormone replacement over the years, currently on 175 micrograms. she reports compliance to this medication:  Taking it daily on empty stomach with water, separated by >30 minutes before breakfast and other medications, and by at least 4 hours from calcium, iron, PPIs, multivitamins.  I reviewed patient's thyroid tests:  Lab Results  Component Value Date   TSH 31.100 (H) 04/14/2020   TSH 32.800 (H) 03/10/2020   TSH 20.000 (H) 01/29/2020   TSH 17.800 (H) 01/25/2020   FREET4 0.77 (L) 01/29/2020     Pt describes: - weight loss (due to decreased appetite) - profound fatigue - depression/ anxiety - constipation - headaches   Pt denies feeling nodules in neck, hoarseness, dysphagia/odynophagia, SOB with lying down.  she does not know whether she has a family history of thyroid disorders because she was adopted. 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 She denies recent use of iodine supplements or Biotin containing supplements (she stopped taking Biotin approximately 3 months ago).  I reviewed her chart and she also has a history of crohn's disease.   ROS:  Constitutional: + weight loss, + profound fatigue, no subjective hyperthermia, no subjective hypothermia Eyes: no  blurry vision, no xerophthalmia ENT: no sore throat, no nodules palpated in throat, no dysphagia/odynophagia, no hoarseness Cardiovascular: no Chest Pain, no Shortness of Breath, no palpitations, no leg swelling Respiratory: no cough, no SOB Gastrointestinal: no Nausea/Vomiting/Diarhhea Musculoskeletal: no muscle/joint aches Skin: no rashes Neurological: + tremors, no numbness, no tingling, no dizziness Psychiatric: + depression / anxiety   OBJECTIVE:  BP 134/89 (BP Location: Right Arm, Patient Position: Sitting)    Pulse 72    Ht 5' 1"  (1.549 m)    BMI 31.97 kg/m  Wt Readings from Last 3 Encounters:  04/16/20 169 lb 3.2 oz (76.7 kg)  03/10/20 174 lb (78.9 kg)  02/11/20 173 lb (78.5 kg)   BP Readings from Last 3 Encounters:  04/16/20 134/89  04/16/20 134/79  03/10/20 111/72    Physical Exam- Limited  Constitutional:  Body mass index is 31.97 kg/m., not in acute distress, normal state of mind Eyes:  EOMI, no exophthalmos Neck: Supple Thyroid: No gross goiter Cardiovascular: RRR, no murmers, rubs, or gallops, no edema Respiratory: Adequate breathing efforts, no crackles, rales, rhonchi, or wheezing Musculoskeletal: no gross deformities, strength intact in all four extremities, no gross restriction of joint movements Skin:  no rashes, no hyperemia Neurological: + mild tremor with outstretched hands   CMP ( most recent) CMP     Component Value Date/Time   NA 137 01/25/2020 1024   K 4.7 01/25/2020 1024   CL 99 01/25/2020 1024   CO2 23 01/25/2020 1024   GLUCOSE 110 (H) 01/25/2020 1024   GLUCOSE 114 (H) 08/30/2019 0926   BUN 8 01/25/2020 1024   CREATININE 0.92 01/25/2020 1024   CALCIUM 9.7 01/25/2020 1024   PROT  6.9 01/25/2020 1024   ALBUMIN 4.9 01/25/2020 1024   AST 18 01/25/2020 1024   ALT 16 01/25/2020 1024   ALKPHOS 92 01/25/2020 1024   BILITOT 0.4 01/25/2020 1024   GFRNONAA 71 01/25/2020 1024   GFRAA 82 01/25/2020 1024     Diabetic Labs (most recent): No  results found for: HGBA1C   Lipid Panel ( most recent) Lipid Panel     Component Value Date/Time   CHOL 252 (H) 01/25/2020 1024   TRIG 141 01/25/2020 1024   HDL 55 01/25/2020 1024   CHOLHDL 4.6 (H) 01/25/2020 1024   LDLCALC 172 (H) 01/25/2020 1024   LABVLDL 25 01/25/2020 1024       Lab Results  Component Value Date   TSH 31.100 (H) 04/14/2020   TSH 32.800 (H) 03/10/2020   TSH 20.000 (H) 01/29/2020   TSH 17.800 (H) 01/25/2020   FREET4 0.77 (L) 01/29/2020       ASSESSMENT / PLAN:  1. Hypothyroidism- unspecified -Her previous TFTs are consistent with under replacement despite several increases in her levothyroxine.  She assures she is taking her medication appropriately.  According to her weight, a total hormone replacement dose should be somewhere near the 125 mcg dose (substantially lower than her current dose).   Patient with long-standing hypothyroidism, on levothyroxine therapy. On physical exam, patient does not  have gross goiter, thyroid nodules, or neck compression symptoms.  - We discussed about correct intake of levothyroxine, at fasting, with water, separated by at least 30 minutes from breakfast, and separated by more than 4 hours from calcium, iron, multivitamins, acid reflux medications (PPIs). -Patient is made aware of the fact that thyroid hormone replacement is needed for life, dose to be adjusted by periodic monitoring of thyroid function tests.  - Will check more comprehensive thyroid tests before next visit: TSH, free T4, Free T3, Thyroglobulin antibodies, TPO antibodies.  -I will also order baseline thyroid ultrasound to assess anatomy.  -It appears she may have a medication absorption issue.  Will switch to branded Synthroid at current dose 175 mcg po daily before breakfast and have her return after being on medication for approximately 6 weeks with repeat labs to assess response.    - Time spent with the patient: 45 minutes, of which >50% was spent in  obtaining information about her symptoms, reviewing her previous labs, evaluations, and treatments, counseling her about her hypothyroidism , and developing a plan to confirm the diagnosis and long term treatment as necessary. Please refer to " Patient Self Inventory" in the Media  tab for reviewed elements of pertinent patient history.  Arlyce Dice participated in the discussions, expressed understanding, and voiced agreement with the above plans.  All questions were answered to her satisfaction. she is encouraged to contact clinic should she have any questions or concerns prior to her return visit.   FOLLOW UP PLAN:  Return in about 7 weeks (around 06/04/2020) for Thyroid follow up, Previsit labs, thyroid ultrasound.  Rayetta Pigg, [facility] [address] [place], [place] [address] Phone: [phone] Fax: [phone]  04/16/2020, 2:11 PM

## 2020-04-18 LAB — URINE CULTURE

## 2020-04-22 ENCOUNTER — Ambulatory Visit (HOSPITAL_COMMUNITY)
Admission: RE | Admit: 2020-04-22 | Discharge: 2020-04-22 | Disposition: A | Discharge status: Home / Self Care | Payer: Medicare HMO | Source: Ambulatory Visit | Attending: Nurse Practitioner | Admitting: Nurse Practitioner

## 2020-04-22 ENCOUNTER — Other Ambulatory Visit: Payer: Self-pay

## 2020-04-22 ENCOUNTER — Telehealth: Payer: Self-pay | Admitting: *Deleted

## 2020-04-22 DIAGNOSIS — E039 Hypothyroidism, unspecified: Secondary | ICD-10-CM | POA: Insufficient documentation

## 2020-04-22 NOTE — Telephone Encounter (Signed)
Called and left message that I am calling with results.

## 2020-04-22 NOTE — Telephone Encounter (Signed)
Pt informed of normal urine culture.

## 2020-04-22 NOTE — Telephone Encounter (Signed)
-----   Message from Estill Dooms, NP sent at 04/22/2020  2:15 PM EST ----- Let Monya know urine did not grow out any dominant bacteria

## 2020-05-12 ENCOUNTER — Telehealth: Payer: Self-pay

## 2020-05-12 NOTE — Telephone Encounter (Signed)
Pt called stating that you recently put her on anxiety medication and she doesn't feel like it is working. She wanted to know if this could be increased? Please advise.

## 2020-05-13 DIAGNOSIS — M47816 Spondylosis without myelopathy or radiculopathy, lumbar region: Secondary | ICD-10-CM | POA: Diagnosis not present

## 2020-05-13 NOTE — Telephone Encounter (Signed)
Needs an appt to discuss. Can be phone. Thank you

## 2020-05-14 ENCOUNTER — Encounter: Payer: Self-pay | Admitting: Family Medicine

## 2020-05-14 ENCOUNTER — Telehealth (INDEPENDENT_AMBULATORY_CARE_PROVIDER_SITE_OTHER): Payer: Medicare HMO | Admitting: Family Medicine

## 2020-05-14 ENCOUNTER — Other Ambulatory Visit: Payer: Self-pay

## 2020-05-14 DIAGNOSIS — F419 Anxiety disorder, unspecified: Secondary | ICD-10-CM | POA: Diagnosis not present

## 2020-05-14 MED ORDER — BUSPIRONE HCL 7.5 MG PO TABS: 7.5000 mg | ORAL_TABLET | Freq: Three times a day (TID) | ORAL | 1 refills | Status: DC

## 2020-05-14 MED ORDER — SERTRALINE HCL 100 MG PO TABS: 100.0000 mg | ORAL_TABLET | Freq: Every day | ORAL | 2 refills | Status: DC

## 2020-05-14 NOTE — Telephone Encounter (Signed)
Pt made a phone visit with Jarrett Soho 05-14-20 with Jarrett Soho

## 2020-05-14 NOTE — Patient Instructions (Addendum)
  I appreciate the opportunity to provide you with care for your health and wellness. Today we discussed: anxiety   Follow up:  4 weeks for anxiety   No labs or referrals today  Med changes today.  Merry Christmas and Happy New Year!  Please continue to practice social distancing to keep you, your family, and our community safe.  If you must go out, please wear a mask and practice good handwashing.  It was a pleasure to see you and I look forward to continuing to work together on your health and well-being. Please do not hesitate to call the office if you need care or have questions about your care.  Have a wonderful day. With Gratitude, Cherly Beach, DNP, AGNP-BC

## 2020-05-14 NOTE — Progress Notes (Signed)
Virtual Visit via Telephone Note   This visit type was conducted due to national recommendations for restrictions regarding the COVID-19 Pandemic (e.g. social distancing) in an effort to limit this patient's exposure and mitigate transmission in our community.  Due to her co-morbid illnesses, this patient is at least at moderate risk for complications without adequate follow up.  This format is felt to be most appropriate for this patient at this time.  The patient did not have access to video technology/had technical difficulties with video requiring transitioning to audio format only (telephone).  All issues noted in this document were discussed and addressed.  No physical exam could be performed with this format.    Evaluation Performed:  Follow-up visit  Date:  05/14/2020   ID:  Victoria Deleon, DOB 08/29/1965, MRN [id number]  Patient Location: Home Provider Location: Office/Clinic  Participants: Nurse for intake and work up; Patient and Provider for Visit and Wrap up  Method of visit: Telephone  Location of Patient: Home Location of Provider: Office Consent was obtain for visit over the telephone. Services rendered by provider: Visit was performed via telephone  I verified that I am speaking with the correct person using two identifiers.  PCP:  Perlie Mayo, NP   Chief Complaint: Anxiety  History of Present Illness:    Victoria Deleon is a 54 y.o. female with history as stated below.  Reports today that she is just feeling an increase in anxiety and exceptionally over the last 5 to 6 weeks.  It was found that her TSH level was quite elevated we have adjusted her medications several times to trying to get her in to the endocrinologist was of pertinent need and she has been seen by them and has follow-up with them in January.  She continues to have low energy, headaches and anxiety.  Denies chest pain, dizziness, cough or shortness of breath.  I have mentioned a referral  for possible psychiatric/psychology need but she declines at this time and is willing to have a medication adjustment to see if that will help her feel better.  The patient does not have symptoms concerning for COVID-19 infection (fever, chills, cough, or new shortness of breath).   Past Medical, Surgical, Social History, Allergies, and Medications have been Reviewed.  Past Medical History:  Diagnosis Date  . Anxiety   . Arthritis   . Crohn disease ([place])   . Depression   . Depression    Phreesia 03/04/2020  . History of kidney stones   . Hyperlipidemia    Phreesia 03/04/2020  . Hypertension   . Hypothyroidism   . Macular degeneration   . Nonspecific abnormal electrocardiogram (ECG) (EKG) 07/27/2018  . Pain due to unicompartmental arthroplasty of knee ([place]) 09/06/2019  . PTSD (post-traumatic stress disorder)   . 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 Thyroid disease    Phreesia 03/04/2020   Past Surgical History:  Procedure Laterality Date  . ABDOMINAL HYSTERECTOMY    . APPENDECTOMY    . BIOPSY  04/24/2018   Procedure: BIOPSY;  Surgeon: Daneil Dolin, MD;  [facility];  Service: Endoscopy;;  (gastric/duodenal)  . CHOLECYSTECTOMY    . COLONOSCOPY WITH PROPOFOL N/A 04/24/2018   Dr. Gala Romney: Normal terminal ileum with negative biopsies, normal-appearing colon with random colon biopsies negative, internal hemorrhoids.  Next colonoscopy 10 years.  . ESOPHAGOGASTRODUODENOSCOPY (EGD) WITH PROPOFOL N/A 04/24/2018   Dr. Gala Romney: Severe ulcerative reflux esophagitis,  small hiatal hernia, small bowel biopsies negative for celiac.  . EYE SURGERY N/A    Phreesia 03/04/2020  . JOINT REPLACEMENT N/A    Phreesia 01/22/2020  . MEDIAL PARTIAL KNEE REPLACEMENT Left   . TOTAL KNEE REVISION Left 09/10/2019   Procedure: LEFT TOTAL KNEE REVISION;  Surgeon: Frederik Pear, MD;  Location: [facility];  Service: Orthopedics;  Laterality: Left;     Current Meds   Medication Sig  . atorvastatin (LIPITOR) 80 MG tablet Take 1 tablet (80 mg total) by mouth at bedtime.  . busPIRone (BUSPAR) 7.5 MG tablet Take 1 tablet (7.5 mg total) by mouth 2 (two) times daily.  . clobetasol cream (TEMOVATE) 9.56 % Apply 1 application topically 2 (two) times daily. Use bid for 2 weeks then daily till seen  . conjugated estrogens (PREMARIN) vaginal cream Use 0.5 gm in vagina daily for 2 weeks then 2-3 x weekly  . doxepin (SINEQUAN) 10 MG capsule Take 10 mg by mouth.  . DULoxetine (CYMBALTA) 20 MG capsule Take 1 capsule (20 mg total) by mouth daily.  Marland Kitchen FIBER PO Take 1 capsule by mouth 2 (two) times daily.   Marland Kitchen gabapentin (NEURONTIN) 100 MG capsule Take 1 capsule (100 mg total) by mouth 3 (three) times daily.  Marland Kitchen linaclotide (LINZESS) 72 MCG capsule Take 1 capsule (72 mcg total) by mouth daily.  . methocarbamol (ROBAXIN) 500 MG tablet Take 500 mg by mouth 2 (two) times daily.  . metoprolol succinate (TOPROL-XL) 25 MG 24 hr tablet Take 1 tablet (25 mg total) by mouth daily.  . Multiple Vitamin (MULTIVITAMIN WITH MINERALS) TABS tablet Take 1 tablet by mouth daily.  . pantoprazole (PROTONIX) 40 MG tablet Take 1 tablet (40 mg total) by mouth daily.  . sertraline (ZOLOFT) 50 MG tablet Take 1 tablet (50 mg total) by mouth daily.  Marland Kitchen sulfamethoxazole-trimethoprim (BACTRIM DS) 800-160 MG tablet Take 1 tablet by mouth 2 (two) times daily. Take 1 bid  . SYNTHROID 175 MCG tablet Take 1 tablet (175 mcg total) by mouth daily before breakfast.  . tiZANidine (ZANAFLEX) 2 MG tablet Take 1 tablet (2 mg total) by mouth every 6 (six) hours as needed.     Allergies:   Codeine   ROS:   Please see the history of present illness.    All other systems reviewed and are negative.   Labs/Other Tests and Data Reviewed:    Recent Labs: 01/25/2020: ALT 16; BUN 8; Creatinine, Ser 0.92; Hemoglobin 15.1; Platelets 291; Potassium 4.7; Sodium 137 04/14/2020: TSH 31.100   Recent Lipid Panel Lab Results   Component Value Date/Time   CHOL 252 (H) 01/25/2020 10:24 AM   TRIG 141 01/25/2020 10:24 AM   HDL 55 01/25/2020 10:24 AM   CHOLHDL 4.6 (H) 01/25/2020 10:24 AM   LDLCALC 172 (H) 01/25/2020 10:24 AM    Wt Readings from Last 3 Encounters:  05/14/20 160 lb (72.6 kg)  04/16/20 169 lb 3.2 oz (76.7 kg)  03/10/20 174 lb (78.9 kg)     Objective:    Vital Signs:  Ht 5' 1"  (1.549 m)   Wt 160 lb (72.6 kg)   BMI 30.23 kg/m    VITAL SIGNS:  reviewed GEN:  no acute distress RESPIRATORY:  no shortness of breath in conversation PSYCH:  flat affect and mood   Depression screen [facility] 2/9 05/14/2020 04/15/2020 03/04/2020 02/11/2020 01/25/2020  Decreased Interest 1 0 0 3 0  Down, Depressed, Hopeless 1 3 0 0 0  PHQ - 2 Score  2 3 0 3 0  Altered sleeping 3 1 - 3 -  Tired, decreased energy 3 3 - 3 -  Change in appetite 3 3 - 3 -  Feeling bad or failure about yourself  1 1 - 0 -  Trouble concentrating 1 1 - 1 -  Moving slowly or fidgety/restless 1 0 - 0 -  Suicidal thoughts 0 0 - 0 -  PHQ-9 Score 14 12 - 13 -  Difficult doing work/chores Somewhat difficult Somewhat difficult - Somewhat difficult -   GAD 7 : Generalized Anxiety Score 04/15/2020 03/04/2020 02/11/2020 01/25/2020  Nervous, Anxious, on Edge 3 3 3 3   Control/stop worrying 3 3 3 3   Worry too much - different things 3 3 3 3   Trouble relaxing 3 3 3 3   Restless 3 3 3 3   Easily annoyed or irritable 3 3 0 3  Afraid - awful might happen 1 3 0 3  Total GAD 7 Score 19 21 15 21   Anxiety Difficulty Somewhat difficult Somewhat difficult Not difficult at all Very difficult      ASSESSMENT & PLAN:    1. Anxiety  - sertraline (ZOLOFT) 100 MG tablet; Take 1 tablet (100 mg total) by mouth daily.  Dispense: 30 tablet; Refill: 2 - busPIRone (BUSPAR) 7.5 MG tablet; Take 1 tablet (7.5 mg total) by mouth 3 (three) times daily.  Dispense: 90 tablet; Refill: 1   Time:   Today, I have spent 7 minutes with the patient with telehealth technology  discussing the above problems.     Medication Adjustments/Labs and Tests Ordered: Current medicines are reviewed at length with the patient today.  Concerns regarding medicines are outlined above.   Tests Ordered: No orders of the defined types were placed in this encounter.   Medication Changes: No orders of the defined types were placed in this encounter.    Disposition:  Follow up 4 weeks Signed, Perlie Mayo, NP  05/14/2020 10:37 AM     [place]

## 2020-05-14 NOTE — Assessment & Plan Note (Signed)
Depression has worsened again some.  However she reports anxiety is a major component that she thinks is driving this.  She is willing to have an increase in Zoloft 200 mg and to increase BuSpar to 3 times daily.  I have encouraged her to think about a referral if symptoms do not improve with the improvement of her TSH level and the changes in medications.  At this time she wants to refrain until these do not help her before she goes to see another provider.  She denies having any SI or HI.

## 2020-05-15 ENCOUNTER — Other Ambulatory Visit: Payer: Self-pay | Admitting: Family Medicine

## 2020-05-15 DIAGNOSIS — F419 Anxiety disorder, unspecified: Secondary | ICD-10-CM

## 2020-05-21 DIAGNOSIS — E039 Hypothyroidism, unspecified: Secondary | ICD-10-CM | POA: Diagnosis not present

## 2020-05-22 LAB — THYROGLOBULIN ANTIBODY: Thyroglobulin Antibody: 1 IU/mL — ABNORMAL HIGH (ref 0.0–0.9)

## 2020-05-22 LAB — THYROID PEROXIDASE ANTIBODY: Thyroperoxidase Ab SerPl-aCnc: 255 IU/mL — ABNORMAL HIGH (ref 0–34)

## 2020-05-22 LAB — TSH: TSH: 33.6 u[IU]/mL — ABNORMAL HIGH (ref 0.450–4.500)

## 2020-05-22 LAB — T4, FREE: Free T4: 0.72 ng/dL — ABNORMAL LOW (ref 0.82–1.77)

## 2020-05-22 LAB — T3, FREE: T3, Free: 2.5 pg/mL (ref 2.0–4.4)

## 2020-06-04 ENCOUNTER — Ambulatory Visit: Payer: Medicare HMO | Admitting: Nurse Practitioner

## 2020-06-06 ENCOUNTER — Other Ambulatory Visit: Payer: Self-pay

## 2020-06-06 ENCOUNTER — Encounter: Payer: Self-pay | Admitting: Nurse Practitioner

## 2020-06-06 ENCOUNTER — Ambulatory Visit: Payer: Medicare HMO | Admitting: Nurse Practitioner

## 2020-06-06 VITALS — BP 127/83 | HR 62 | Ht 61.0 in | Wt 170.0 lb

## 2020-06-06 DIAGNOSIS — E038 Other specified hypothyroidism: Secondary | ICD-10-CM | POA: Diagnosis not present

## 2020-06-06 DIAGNOSIS — E063 Autoimmune thyroiditis: Secondary | ICD-10-CM

## 2020-06-06 MED ORDER — TIROSINT 150 MCG PO CAPS: 150.0000 ug | ORAL_CAPSULE | Freq: Every day | ORAL | 3 refills | Status: DC

## 2020-06-06 NOTE — Progress Notes (Signed)
Endocrinology Follow up Visit                                        06/06/2020, 10:45 AM   Victoria Deleon is a 55 y.o.-year-old female patient being seen in follow up after being seen in consultation for hypothyroidism referred by Perlie Mayo, NP.  SUBJECTIVE:  Past Medical History:  Diagnosis Date  . Anxiety   . Arthritis   . Crohn disease ([place])   . Depression   . Depression    Phreesia 03/04/2020  . History of kidney stones   . Hyperlipidemia    Phreesia 03/04/2020  . Hypertension   . Hypothyroidism   . Macular degeneration   . Nonspecific abnormal electrocardiogram (ECG) (EKG) 07/27/2018  . Pain due to unicompartmental arthroplasty of knee ([place]) 09/06/2019  . PTSD (post-traumatic stress disorder)   . 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 Thyroid disease    Phreesia 03/04/2020    Past Surgical History:  Procedure Laterality Date  . ABDOMINAL HYSTERECTOMY    . APPENDECTOMY    . BIOPSY  04/24/2018   Procedure: BIOPSY;  Surgeon: Daneil Dolin, MD;  [facility];  Service: Endoscopy;;  (gastric/duodenal)  . CHOLECYSTECTOMY    . COLONOSCOPY WITH PROPOFOL N/A 04/24/2018   Dr. Gala Romney: Normal terminal ileum with negative biopsies, normal-appearing colon with random colon biopsies negative, internal hemorrhoids.  Next colonoscopy 10 years.  . ESOPHAGOGASTRODUODENOSCOPY (EGD) WITH PROPOFOL N/A 04/24/2018   Dr. Gala Romney: Severe ulcerative reflux esophagitis, small hiatal hernia, small bowel biopsies negative for celiac.  . EYE SURGERY N/A    Phreesia 03/04/2020  . JOINT REPLACEMENT N/A    Phreesia 01/22/2020  . MEDIAL PARTIAL KNEE REPLACEMENT Left   . TOTAL KNEE REVISION Left 09/10/2019   Procedure: LEFT TOTAL KNEE REVISION;  Surgeon: Frederik Pear, MD;  Location: [facility];  Service: Orthopedics;  Laterality: Left;    Social History   Socioeconomic History  .  Marital status: Married    Spouse name: Fritz Pickerel   . Number of children: Not on file  . Years of education: Not on file  . Highest education level: Not on file  Occupational History    Comment: medical disability   Tobacco Use  . Smoking status: Current Every Day Smoker    Packs/day: 0.25    Years: 30.00    Pack years: 7.50    Types: Cigarettes  . Smokeless tobacco: Never Used  Vaping Use  . Vaping Use: Never used  Substance and Sexual Activity  . Alcohol use: Yes    Comment: rare-maybe 2 glasses of wine/year  . Drug use: Never  . Sexual activity: Not Currently    Birth control/protection: Surgical    Comment: hyst  Other Topics Concern  . 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 Difficulty of Paying Living Expenses: Somewhat hard  Food Insecurity: No Food Insecurity  . Worried About Charity fundraiser in the Last Year: Never true  . Ran Out of Food in the Last Year: Never true  Transportation Needs: No Transportation Needs  . Lack of Transportation (Medical): No  . Lack of Transportation (Non-Medical): No  Physical Activity: Sufficiently Active  . Days of Exercise per Week: 4 days  . Minutes of Exercise per Session: 50 min  Stress: No Stress Concern Present  . Feeling of Stress : Only a little  Social Connections: Socially Isolated  . Frequency of Communication with Friends and Family: Never  . Frequency of Social Gatherings with Friends and Family: Never  . Attends Religious Services: Never  . Active Member of Clubs or Organizations: No  . Attends Archivist Meetings: Never  . Marital  Status: Married    Family History  Adopted: Yes  Problem Relation Age of Onset  . Colon cancer Neg Hx     Outpatient Encounter Medications as of 06/06/2020  Medication Sig  . atorvastatin (LIPITOR) 80 MG tablet Take 1 tablet (80 mg total) by mouth at bedtime.  . busPIRone (BUSPAR) 7.5 MG tablet Take 1 tablet (7.5 mg total) by mouth 3 (three) times daily.  . clobetasol cream (TEMOVATE) 3.23 % Apply 1 application topically 2 (two) times daily. Use bid for 2 weeks then daily till seen  . doxepin (SINEQUAN) 10 MG capsule Take 10 mg by mouth.  . FIBER PO Take 1 capsule by mouth 2 (two) times daily.   Marland Kitchen gabapentin (NEURONTIN) 100 MG capsule Take 1 capsule (100 mg total) by mouth 3 (three) times daily.  Marland Kitchen linaclotide (LINZESS) 72 MCG capsule Take 1 capsule (72 mcg total) by mouth daily.  . methocarbamol (ROBAXIN) 500 MG tablet Take 500 mg by mouth 2 (two) times daily.  . metoprolol succinate (TOPROL-XL) 25 MG 24 hr tablet Take 1 tablet (25 mg total) by mouth daily.  . Multiple Vitamin (MULTIVITAMIN WITH MINERALS) TABS tablet Take 1 tablet by mouth daily.  . pantoprazole (PROTONIX) 40 MG tablet Take 1 tablet (40 mg total) by mouth daily.  . sertraline (ZOLOFT) 100 MG tablet Take 1 tablet (100 mg total) by mouth daily.  Marland Kitchen TIROSINT 150 MCG CAPS Take 1 capsule (150 mcg total) by mouth daily before breakfast.  . tiZANidine (ZANAFLEX) 2 MG tablet Take 1 tablet (2 mg total) by mouth every 6 (six) hours as needed.  . [DISCONTINUED] SYNTHROID 175 MCG tablet Take 1 tablet (175 mcg total) by mouth daily before breakfast.  . [DISCONTINUED] conjugated estrogens (PREMARIN) vaginal cream Use 0.5 gm in vagina daily for 2 weeks then 2-3 x weekly   No facility-administered encounter medications on file as of 06/06/2020.    ALLERGIES: Allergies  Allergen Reactions  . Codeine Anaphylaxis   VACCINATION STATUS: Immunization History  Administered Date(s) Administered  . Moderna Sars-Covid-2 Vaccination  11/15/2019, 01/04/2020     HPI    Victoria Deleon  is a patient with the above medical history. she was diagnosed  with hypothyroidism at approximate age of 64 years, which required subsequent initiation of Levothyroxine therapy. she was given various doses of thyroid hormone replacement over the years, currently on 175 micrograms. she reports compliance to this medication:  Taking it daily on empty stomach with water, separated by >30 minutes before breakfast and other medications, and by at least 4 hours from calcium, iron, PPIs, multivitamins.  I reviewed patient's thyroid tests:  Lab Results  Component Value Date   TSH 33.600 (H) 05/21/2020   TSH 31.100 (H) 04/14/2020   TSH 32.800 (H) 03/10/2020   TSH 20.000 (H) 01/29/2020   TSH 17.800 (H) 01/25/2020   FREET4 0.72 (L) 05/21/2020   FREET4 0.77 (L) 01/29/2020     Pt describes: - weight gain - profound fatigue - depression/ anxiety - constipation - headaches  Pt denies feeling nodules in neck, hoarseness, dysphagia/odynophagia, SOB with lying down.  she does not know whether she has a family history of thyroid disorders because she was adopted. 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 She denies recent use of iodine supplements or Biotin containing supplements and stopped eating soy products as much (she is vegan).  I reviewed her chart and she also has a history of crohn's disease.   ROS:  Constitutional: + weight gain, + profound fatigue, no subjective hyperthermia, no subjective hypothermia Eyes: no blurry vision, no xerophthalmia ENT: no sore throat, no nodules palpated in throat, no dysphagia/odynophagia, no hoarseness Cardiovascular: no Chest Pain, no Shortness of Breath, no palpitations, no leg swelling Respiratory: no cough, no SOB Gastrointestinal: no nausea/Vomiting, + intermittent  constipation and diarrhea (has history of Crohn's disease and has good days and bad days) Musculoskeletal: no muscle/joint aches Skin: no rashes Neurological: no tremors, no numbness, no tingling, no dizziness Psychiatric: + depression / anxiety (recently saw PCP for follow up on this where meds were increased)   OBJECTIVE:  BP 127/83 (BP Location: Left Arm, Patient Position: Sitting)   Pulse 62   Ht 5' 1"  (1.549 m)   Wt 170 lb (77.1 kg)   BMI 32.12 kg/m  Wt Readings from Last 3 Encounters:  06/06/20 170 lb (77.1 kg)  05/14/20 160 lb (72.6 kg)  04/16/20 169 lb 3.2 oz (76.7 kg)   BP Readings from Last 3 Encounters:  06/06/20 127/83  04/16/20 134/89  04/16/20 134/79    Physical Exam- Limited  Constitutional:  Body mass index is 32.12 kg/m., not in acute distress, normal state of mind Eyes:  EOMI, no exophthalmos Neck: Supple Cardiovascular: RRR, no murmers, rubs, or gallops, no edema Respiratory: Adequate breathing efforts, no crackles, rales, rhonchi, or wheezing Musculoskeletal: no gross deformities, strength intact in all four extremities, no gross restriction of joint movements Skin:  no rashes, no hyperemia Neurological: + mild tremor with outstretched hands   CMP ( most recent) CMP     Component Value Date/Time   NA 137 01/25/2020 1024   K 4.7 01/25/2020 1024   CL 99 01/25/2020 1024   CO2 23 01/25/2020 1024   GLUCOSE 110 (H) 01/25/2020 1024   GLUCOSE 114 (H) 08/30/2019 0926   BUN 8 01/25/2020 1024   CREATININE 0.92 01/25/2020 1024   CALCIUM 9.7 01/25/2020 1024   PROT 6.9 01/25/2020 1024   ALBUMIN 4.9 01/25/2020 1024   AST 18 01/25/2020 1024   ALT 16 01/25/2020 1024   ALKPHOS 92 01/25/2020 1024  BILITOT 0.4 01/25/2020 1024   GFRNONAA 71 01/25/2020 1024   GFRAA 82 01/25/2020 1024     Diabetic Labs (most recent): No results found for: HGBA1C   Lipid Panel ( most recent) Lipid Panel     Component Value Date/Time   CHOL 252 (H) 01/25/2020 1024    TRIG 141 01/25/2020 1024   HDL 55 01/25/2020 1024   CHOLHDL 4.6 (H) 01/25/2020 1024   LDLCALC 172 (H) 01/25/2020 1024   LABVLDL 25 01/25/2020 1024       Lab Results  Component Value Date   TSH 33.600 (H) 05/21/2020   TSH 31.100 (H) 04/14/2020   TSH 32.800 (H) 03/10/2020   TSH 20.000 (H) 01/29/2020   TSH 17.800 (H) 01/25/2020   FREET4 0.72 (L) 05/21/2020   FREET4 0.77 (L) 01/29/2020       ASSESSMENT / PLAN:  1. Hypothyroidism due to Hashimoto's thyroiditis:  -Her repeat thyroid function tests still suggest inadequate hormone replacement despite her large dose of branded Synthroid.  Her TPO and thyroglobulin antibodies were positive indicating autoimmune Hashimoto's thyroiditis as the cause for her hypothyroidism.  Her baseline thyroid ultrasound showed heterogeneous thyroid tissue without suspicious nodules further suggesting autoimmune thyroid disease.  -It appears she may have a medication absorption issue which may be related to her chronic PPI use and Crohn's disease.  Will switch to more pure brand of synthetic hormone, Tirosint at lower dose of 150 mcg (closer to her weight based total hormone replacement dose, which may help her anxiety).   - We discussed about correct intake of levothyroxine, at fasting, with water, separated by at least 30 minutes from breakfast, and separated by more than 4 hours from calcium, iron, multivitamins, acid reflux medications (PPIs). -Patient is made aware of the fact that thyroid hormone replacement is needed for life, dose to be adjusted by periodic monitoring of thyroid function tests.  I will have her return in 8 weeks with repeat labs to assess her response to change in medication.       - Time spent on this patient care encounter:  20 minutes of which 50% was spent in  counseling and the rest reviewing  her current and  previous labs / studies and medications  doses and developing a plan for long term care. Arlyce Dice   participated in the discussions, expressed understanding, and voiced agreement with the above plans.  All questions were answered to her satisfaction. she is encouraged to contact clinic should she have any questions or concerns prior to her return visit.   FOLLOW UP PLAN:  Return in about 8 weeks (around 08/01/2020) for Thyroid follow up, Previsit labs.  Rayetta Pigg, [facility] [address] [place], [place] [address] Phone: [phone] Fax: [phone]  06/06/2020, 10:45 AM

## 2020-06-06 NOTE — Patient Instructions (Signed)

## 2020-06-11 ENCOUNTER — Ambulatory Visit: Payer: Medicare HMO | Admitting: Adult Health

## 2020-07-03 DIAGNOSIS — M47816 Spondylosis without myelopathy or radiculopathy, lumbar region: Secondary | ICD-10-CM | POA: Diagnosis not present

## 2020-07-07 ENCOUNTER — Ambulatory Visit (INDEPENDENT_AMBULATORY_CARE_PROVIDER_SITE_OTHER): Payer: Medicare HMO | Admitting: Nurse Practitioner

## 2020-07-07 ENCOUNTER — Other Ambulatory Visit: Payer: Self-pay

## 2020-07-07 ENCOUNTER — Encounter: Payer: Self-pay | Admitting: Nurse Practitioner

## 2020-07-07 DIAGNOSIS — R1032 Left lower quadrant pain: Secondary | ICD-10-CM

## 2020-07-07 DIAGNOSIS — F419 Anxiety disorder, unspecified: Secondary | ICD-10-CM

## 2020-07-07 DIAGNOSIS — R109 Unspecified abdominal pain: Secondary | ICD-10-CM | POA: Insufficient documentation

## 2020-07-07 MED ORDER — SERTRALINE HCL 100 MG PO TABS: 150.0000 mg | ORAL_TABLET | Freq: Every day | ORAL | 1 refills | Status: DC

## 2020-07-07 NOTE — Patient Instructions (Addendum)
You are getting a toradol shot for pain today. In addition, we are checking labs and getting a CT scan of your abdomen.  We will follow-up in 1 week, and we will call you when we get the CT results back.

## 2020-07-07 NOTE — Assessment & Plan Note (Signed)
-  LLQ and left flank pain x 2 weeks; 5-6 currently but goes up to 10 and doubles her over at times -she has hx of kidney stones as well as Crohn's, but Feb 2020 w/u with Dr. Roseanne Kaufman office showed no active Crohn's -will get labs today

## 2020-07-07 NOTE — Assessment & Plan Note (Signed)
-  GAD-7 = 18 today; last was 19 -PHQ-9 = 6 today; down from 14 -INCREASE sertraline to 150 mg daily

## 2020-07-07 NOTE — Progress Notes (Signed)
Acute Office Visit  Subjective:    Patient ID: Victoria Deleon, female    DOB: [birth year], 55 y.o.   MRN: [id number]  Chief Complaint  Patient presents with  . Nausea    X 2 weeks intermittent   . Anxiety    Increases when pain in flank/abdominal pain starts   . Headache    X 2 weeks   . Constipation    Intermittent   . Diarrhea    HPI Patient is in today for headaches and anxiety.  Her last PHQ-9 was 14 (05/14/20) and GAD-7 was 19 (04/15/20).  She was taking sertraline 100 mg daily as well as buspirone 7.5 mg TID.    She is followed by endocrinology for hypothyroidism d/t Hashimoto's.  Her last TSH was 33.6 and T4 0.72 on 05/21/20.  She had nausea that started about 2 weeks ago as well as some abdominal pains.  She is having LLQ and left flank pain and bloating.  She gets more anxious when her pain increases. She rates her pain at 5-6/10 today, but states that she doubles over in pain at times and has 10/10 pain.  She states she has been having diarrhea as well.  Past Medical History:  Diagnosis Date  . Anxiety   . Arthritis   . Crohn disease ([place])   . Depression   . Depression    Phreesia 03/04/2020  . History of kidney stones   . Hyperlipidemia    Phreesia 03/04/2020  . Hypertension   . Hypothyroidism   . Macular degeneration   . Nonspecific abnormal electrocardiogram (ECG) (EKG) 07/27/2018  . Pain due to unicompartmental arthroplasty of knee ([place]) 09/06/2019  . PTSD (post-traumatic stress disorder)   . 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 Thyroid disease    Phreesia 03/04/2020    Past Surgical History:  Procedure Laterality Date  . ABDOMINAL HYSTERECTOMY    . APPENDECTOMY    . BIOPSY  04/24/2018   Procedure: BIOPSY;  Surgeon: Daneil Dolin, MD;  [facility];  Service: Endoscopy;;  (gastric/duodenal)  . CHOLECYSTECTOMY    . COLONOSCOPY WITH PROPOFOL N/A 04/24/2018   Dr. Gala Romney: Normal terminal ileum with negative  biopsies, normal-appearing colon with random colon biopsies negative, internal hemorrhoids.  Next colonoscopy 10 years.  . ESOPHAGOGASTRODUODENOSCOPY (EGD) WITH PROPOFOL N/A 04/24/2018   Dr. Gala Romney: Severe ulcerative reflux esophagitis, small hiatal hernia, small bowel biopsies negative for celiac.  . EYE SURGERY N/A    Phreesia 03/04/2020  . JOINT REPLACEMENT N/A    Phreesia 01/22/2020  . MEDIAL PARTIAL KNEE REPLACEMENT Left   . TOTAL KNEE REVISION Left 09/10/2019   Procedure: LEFT TOTAL KNEE REVISION;  Surgeon: Frederik Pear, MD;  Location: [facility];  Service: Orthopedics;  Laterality: Left;    Family History  Adopted: Yes  Problem Relation Age of Onset  . Colon cancer Neg Hx     Social History   Socioeconomic History  . Marital status: Married    Spouse name: Fritz Pickerel   . Number of children: Not on file  . Years of education: Not on file  . Highest education level: Not on file  Occupational History    Comment: medical disability   Tobacco Use  . Smoking status: Current Every Day Smoker    Packs/day: 0.25    Years: 30.00    Pack years: 7.50    Types: Cigarettes  . Smokeless tobacco: Never Used  Vaping Use  .  Vaping Use: Never used  Substance and Sexual Activity  . Alcohol use: Yes    Comment: rare-maybe 2 glasses of wine/year  . Drug use: Never  . Sexual activity: Not Currently    Birth control/protection: Surgical    Comment: hyst  Other Topics Concern  . 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 Difficulty of Paying Living Expenses: Somewhat hard  Food Insecurity: No Food Insecurity  . Worried  About Charity fundraiser in the Last Year: Never true  . Ran Out of Food in the Last Year: Never true  Transportation Needs: No Transportation Needs  . Lack of Transportation (Medical): No  . Lack of Transportation (Non-Medical): No  Physical Activity: Sufficiently Active  . Days of Exercise per Week: 4 days  . Minutes of Exercise per Session: 50 min  Stress: No Stress Concern Present  . Feeling of Stress : Only a little  Social Connections: Socially Isolated  . Frequency of Communication with Friends and Family: Never  . Frequency of Social Gatherings with Friends and Family: Never  . Attends Religious Services: Never  . Active Member of Clubs or Organizations: No  . Attends Archivist Meetings: Never  . Marital Status: Married  Human resources officer Violence: Not At Risk  . Fear of Current or Ex-Partner: No  . Emotionally Abused: No  . Physically Abused: No  . Sexually Abused: No    Outpatient Medications Prior to Visit  Medication Sig Dispense Refill  . atorvastatin (LIPITOR) 80 MG tablet Take 1 tablet (80 mg total) by mouth at bedtime. 90 tablet 0  . busPIRone (BUSPAR) 7.5 MG tablet Take 1 tablet (7.5 mg total) by mouth 3 (three) times daily. 90 tablet 1  . clobetasol cream (TEMOVATE) 6.26 % Apply 1 application topically 2 (two) times daily. Use bid for 2 weeks then daily till seen 30 g 1  . doxepin (SINEQUAN) 10 MG capsule Take 10 mg by mouth.    . FIBER PO Take 1 capsule by mouth 2 (two) times daily.     Marland Kitchen gabapentin (NEURONTIN) 100 MG capsule Take 1 capsule (100 mg total) by mouth 3 (three) times daily. 90 capsule 2  . linaclotide (LINZESS) 72 MCG capsule Take 1 capsule (72 mcg total) by mouth daily. 30 capsule 3  . methocarbamol (ROBAXIN) 500 MG tablet Take 500 mg by mouth 2 (two) times daily.    . metoprolol succinate (TOPROL-XL) 25 MG 24 hr tablet Take 1 tablet (25 mg total) by mouth daily. 30 tablet 3  . Multiple Vitamin (MULTIVITAMIN WITH MINERALS) TABS tablet  Take 1 tablet by mouth daily.    . pantoprazole (PROTONIX) 40 MG tablet Take 1 tablet (40 mg total) by mouth daily. 30 tablet 3  . TIROSINT 150 MCG CAPS Take 1 capsule (150 mcg total) by mouth daily before breakfast. 30 capsule 3  . tiZANidine (ZANAFLEX) 2 MG tablet Take 1 tablet (2 mg total) by  mouth every 6 (six) hours as needed. 60 tablet 0  . sertraline (ZOLOFT) 100 MG tablet Take 1 tablet (100 mg total) by mouth daily. 30 tablet 2   No facility-administered medications prior to visit.    Allergies  Allergen Reactions  . Codeine Anaphylaxis    Review of Systems  Constitutional: Negative.   Respiratory: Negative.   Cardiovascular: Negative.   Gastrointestinal: Positive for abdominal pain, constipation, diarrhea and nausea. Negative for blood in stool.  Psychiatric/Behavioral: Negative for self-injury and suicidal ideas. The patient is nervous/anxious.        Objective:    Physical Exam Constitutional:      Appearance: She is well-developed.  Cardiovascular:     Rate and Rhythm: Normal rate and regular rhythm.     Heart sounds: Normal heart sounds.  Pulmonary:     Effort: Pulmonary effort is normal.     Breath sounds: Normal breath sounds.  Abdominal:     General: Bowel sounds are normal. There is no distension.     Palpations: Abdomen is soft. There is no mass.     Tenderness: There is abdominal tenderness. There is guarding.  Musculoskeletal:        General: Normal range of motion.  Neurological:     Mental Status: She is alert.     BP 114/76   Pulse 60   Temp 98.5 F (36.9 C)   Resp 18   Ht 5' 1"  (1.549 m)   Wt 171 lb (77.6 kg)   SpO2 92%   BMI 32.31 kg/m  Wt Readings from Last 3 Encounters:  07/07/20 171 lb (77.6 kg)  06/06/20 170 lb (77.1 kg)  05/14/20 160 lb (72.6 kg)    Health Maintenance Due  Topic Date Due  . Hepatitis C Screening  Never done  . HIV Screening  Never done  . TETANUS/TDAP  Never done  . COVID-19 Vaccine (3 - Booster for  Moderna series) 07/06/2020    There are no preventive care reminders to display for this patient.   Lab Results  Component Value Date   TSH 33.600 (H) 05/21/2020   Lab Results  Component Value Date   WBC 5.5 01/25/2020   HGB 15.1 01/25/2020   HCT 43.9 01/25/2020   MCV 88 01/25/2020   PLT 291 01/25/2020   Lab Results  Component Value Date   NA 137 01/25/2020   K 4.7 01/25/2020   CO2 23 01/25/2020   GLUCOSE 110 (H) 01/25/2020   BUN 8 01/25/2020   CREATININE 0.92 01/25/2020   BILITOT 0.4 01/25/2020   ALKPHOS 92 01/25/2020   AST 18 01/25/2020   ALT 16 01/25/2020   PROT 6.9 01/25/2020   ALBUMIN 4.9 01/25/2020   CALCIUM 9.7 01/25/2020   ANIONGAP 9 08/30/2019   Lab Results  Component Value Date   CHOL 252 (H) 01/25/2020   Lab Results  Component Value Date   HDL 55 01/25/2020   Lab Results  Component Value Date   LDLCALC 172 (H) 01/25/2020   Lab Results  Component Value Date   TRIG 141 01/25/2020   Lab Results  Component Value Date   CHOLHDL 4.6 (H) 01/25/2020   No results found for: HGBA1C     Assessment & Plan:   Problem List Items Addressed This Visit      Other   Anxiety    -GAD-7 = 18 today; last was 19 -PHQ-9 = 6 today; down from 14 -INCREASE sertraline to 150 mg daily  Relevant Medications   sertraline (ZOLOFT) 100 MG tablet   Abdominal pain    -LLQ and left flank pain x 2 weeks; 5-6 currently but goes up to 10 and doubles her over at times -she has hx of kidney stones as well as Crohn's, but Feb 2020 w/u with Dr. Roseanne Kaufman office showed no active Crohn's -will get labs today      Relevant Orders   CT ABDOMEN PELVIS W WO CONTRAST   CBC with Differential/Platelet   CMP14+EGFR       Meds ordered this encounter  Medications  . sertraline (ZOLOFT) 100 MG tablet    Sig: Take 1.5 tablets (150 mg total) by mouth daily.    Dispense:  45 tablet    Refill:  1   Time spent 30 minutes  Noreene Larsson, NP

## 2020-07-08 LAB — CBC WITH DIFFERENTIAL/PLATELET
Basophils Absolute: 0.1 10*3/uL (ref 0.0–0.2)
Basos: 1 %
EOS (ABSOLUTE): 0.3 10*3/uL (ref 0.0–0.4)
Eos: 4 %
Hematocrit: 42 % (ref 34.0–46.6)
Hemoglobin: 14.9 g/dL (ref 11.1–15.9)
Immature Grans (Abs): 0 10*3/uL (ref 0.0–0.1)
Immature Granulocytes: 0 %
Lymphocytes Absolute: 2.3 10*3/uL (ref 0.7–3.1)
Lymphs: 33 %
MCH: 31.7 pg (ref 26.6–33.0)
MCHC: 35.5 g/dL (ref 31.5–35.7)
MCV: 89 fL (ref 79–97)
Monocytes Absolute: 0.9 10*3/uL (ref 0.1–0.9)
Monocytes: 13 %
Neutrophils Absolute: 3.5 10*3/uL (ref 1.4–7.0)
Neutrophils: 49 %
Platelets: 317 10*3/uL (ref 150–450)
RBC: 4.7 x10E6/uL (ref 3.77–5.28)
RDW: 13.1 % (ref 11.7–15.4)
WBC: 7.1 10*3/uL (ref 3.4–10.8)

## 2020-07-08 LAB — CMP14+EGFR
ALT: 18 IU/L (ref 0–32)
AST: 16 IU/L (ref 0–40)
Albumin/Globulin Ratio: 2 (ref 1.2–2.2)
Albumin: 4.5 g/dL (ref 3.8–4.9)
Alkaline Phosphatase: 67 IU/L (ref 44–121)
BUN/Creatinine Ratio: 12 (ref 9–23)
BUN: 12 mg/dL (ref 6–24)
Bilirubin Total: 0.3 mg/dL (ref 0.0–1.2)
CO2: 23 mmol/L (ref 20–29)
Calcium: 9.6 mg/dL (ref 8.7–10.2)
Chloride: 103 mmol/L (ref 96–106)
Creatinine, Ser: 0.97 mg/dL (ref 0.57–1.00)
GFR calc Af Amer: 76 mL/min/{1.73_m2} (ref >59)
GFR calc non Af Amer: 66 mL/min/{1.73_m2} (ref >59)
Globulin, Total: 2.2 g/dL (ref 1.5–4.5)
Glucose: 100 mg/dL — ABNORMAL HIGH (ref 65–99)
Potassium: 4.2 mmol/L (ref 3.5–5.2)
Sodium: 140 mmol/L (ref 134–144)
Total Protein: 6.7 g/dL (ref 6.0–8.5)

## 2020-07-08 NOTE — Progress Notes (Signed)
Her labs were negative. No sign of infection or elevated LFTs. We will see if anything is revealed on imaging.

## 2020-07-14 ENCOUNTER — Other Ambulatory Visit: Payer: Self-pay

## 2020-07-14 ENCOUNTER — Encounter: Payer: Self-pay | Admitting: Nurse Practitioner

## 2020-07-14 ENCOUNTER — Ambulatory Visit (INDEPENDENT_AMBULATORY_CARE_PROVIDER_SITE_OTHER): Payer: Medicare HMO | Admitting: Nurse Practitioner

## 2020-07-14 DIAGNOSIS — R1032 Left lower quadrant pain: Secondary | ICD-10-CM

## 2020-07-14 MED ORDER — CIPROFLOXACIN HCL 500 MG PO TABS: 500.0000 mg | ORAL_TABLET | Freq: Two times a day (BID) | ORAL | 0 refills | Status: DC

## 2020-07-14 MED ORDER — METRONIDAZOLE 500 MG PO TABS: 500.0000 mg | ORAL_TABLET | Freq: Two times a day (BID) | ORAL | 0 refills | Status: DC

## 2020-07-14 NOTE — Assessment & Plan Note (Addendum)
-  pain has not improved -her labs were unremarkable -will treat for diverticultis d/t LLQ pain being the worse; unsure if this is the etiology as she had hx of Crohns and kidney stones (her pain is colicky); she has GI symptoms so suspect GI etiology -recommend she contact Dr. Gala Romney, her GI doc -imaging is scheduled already

## 2020-07-14 NOTE — Progress Notes (Signed)
Acute Office Visit  Subjective:    Patient ID: Victoria Deleon, female    DOB: 06-Aug-1965, 55 y.o.   MRN: [id number]  Chief Complaint  Patient presents with  . Follow-up  . Abdominal Pain  . Anxiety    HPI Patient is in today for follow-up for abdominal pain. At the time of her las appointment she had been experiencing LLQ and left flank pain x 2 weeks; She rated the pain at 5-6 but it went up to 10 and doubled her over at times -she has hx of kidney stones as well as Crohn's, but Feb 2020 w/u with Dr. Roseanne Kaufman office showed no active Crohn's She was unable to get a STAT CT because she had other plans at the last OV. She has CT schedule din early March. She states her pain is the same as last time. She also states that she is having constipation and diarrhea. She has hx. Of Crohn's.  Past Medical History:  Diagnosis Date  . Anxiety   . Arthritis   . Crohn disease ([place])   . Depression   . Depression    Phreesia 03/04/2020  . History of kidney stones   . Hyperlipidemia    Phreesia 03/04/2020  . Hypertension   . Hypothyroidism   . Macular degeneration   . Nonspecific abnormal electrocardiogram (ECG) (EKG) 07/27/2018  . Pain due to unicompartmental arthroplasty of knee ([place]) 09/06/2019  . PTSD (post-traumatic stress disorder)   . 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 Thyroid disease    Phreesia 03/04/2020    Past Surgical History:  Procedure Laterality Date  . ABDOMINAL HYSTERECTOMY    . APPENDECTOMY    . BIOPSY  04/24/2018   Procedure: BIOPSY;  Surgeon: Daneil Dolin, MD;  Location: [facility];  Service: Endoscopy;;  (gastric/duodenal)  . CHOLECYSTECTOMY    . COLONOSCOPY WITH PROPOFOL N/A 04/24/2018   Dr. Gala Romney: Normal terminal ileum with negative biopsies, normal-appearing colon with random colon biopsies negative, internal hemorrhoids.  Next colonoscopy 10 years.  . ESOPHAGOGASTRODUODENOSCOPY (EGD) WITH PROPOFOL N/A 04/24/2018    Dr. Gala Romney: Severe ulcerative reflux esophagitis, small hiatal hernia, small bowel biopsies negative for celiac.  . EYE SURGERY N/A    Phreesia 03/04/2020  . JOINT REPLACEMENT N/A    Phreesia 01/22/2020  . MEDIAL PARTIAL KNEE REPLACEMENT Left   . TOTAL KNEE REVISION Left 09/10/2019   Procedure: LEFT TOTAL KNEE REVISION;  Surgeon: Frederik Pear, MD;  Location: [facility];  Service: Orthopedics;  Laterality: Left;    Family History  Adopted: Yes  Problem Relation Age of Onset  . Colon cancer Neg Hx     Social History   Socioeconomic History  . Marital status: Married    Spouse name: Fritz Pickerel   . Number of children: Not on file  . Years of education: Not on file  . Highest education level: Not on file  Occupational History    Comment: medical disability   Tobacco Use  . Smoking status: Current Every Day Smoker    Packs/day: 0.25    Years: 30.00    Pack years: 7.50    Types: Cigarettes  . Smokeless tobacco: Never Used  Vaping Use  . Vaping Use: Never used  Substance and Sexual Activity  . Alcohol use: Yes    Comment: rare-maybe 2 glasses of wine/year  . Drug use: Never  . Sexual activity: Not Currently    Birth control/protection: Surgical    Comment:  hyst  Other Topics Concern  . 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 Difficulty of Paying Living Expenses: Somewhat hard  Food Insecurity: No Food Insecurity  . Worried About Charity fundraiser in the Last Year: Never true  . Ran Out of Food in the Last Year: Never true  Transportation Needs: No Transportation Needs  . Lack of Transportation (Medical):  No  . Lack of Transportation (Non-Medical): No  Physical Activity: Sufficiently Active  . Days of Exercise per Week: 4 days  . Minutes of Exercise per Session: 50 min  Stress: No Stress Concern Present  . Feeling of Stress : Only a little  Social Connections: Socially Isolated  . Frequency of Communication with Friends and Family: Never  . Frequency of Social Gatherings with Friends and Family: Never  . Attends Religious Services: Never  . Active Member of Clubs or Organizations: No  . Attends Archivist Meetings: Never  . Marital Status: Married  Human resources officer Violence: Not At Risk  . Fear of Current or Ex-Partner: No  . Emotionally Abused: No  . Physically Abused: No  . Sexually Abused: No    Outpatient Medications Prior to Visit  Medication Sig Dispense Refill  . atorvastatin (LIPITOR) 80 MG tablet Take 1 tablet (80 mg total) by mouth at bedtime. 90 tablet 0  . busPIRone (BUSPAR) 7.5 MG tablet Take 1 tablet (7.5 mg total) by mouth 3 (three) times daily. 90 tablet 1  . clobetasol cream (TEMOVATE) 9.32 % Apply 1 application topically 2 (two) times daily. Use bid for 2 weeks then daily till seen 30 g 1  . doxepin (SINEQUAN) 10 MG capsule Take 10 mg by mouth.    . FIBER PO Take 1 capsule by mouth 2 (two) times daily.     Marland Kitchen gabapentin (NEURONTIN) 100 MG capsule Take 1 capsule (100 mg total) by mouth 3 (three) times daily. 90 capsule 2  . linaclotide (LINZESS) 72 MCG capsule Take 1 capsule (72 mcg total) by mouth daily. 30 capsule 3  . methocarbamol (ROBAXIN) 500 MG tablet Take 500 mg by mouth 2 (two) times daily.    . metoprolol succinate (TOPROL-XL) 25 MG 24 hr tablet Take 1 tablet (25 mg total) by mouth daily. 30 tablet 3  . Multiple Vitamin (MULTIVITAMIN WITH MINERALS) TABS tablet Take 1 tablet by mouth daily.    . pantoprazole (PROTONIX) 40 MG tablet Take 1 tablet (40 mg total) by mouth daily. 30 tablet 3  . sertraline (ZOLOFT) 100 MG tablet Take 1.5 tablets (150 mg  total) by mouth daily. 45 tablet 1  . TIROSINT 150 MCG CAPS Take 1 capsule (150 mcg total) by mouth daily before breakfast. 30 capsule 3  . tiZANidine (ZANAFLEX) 2 MG tablet Take 1 tablet (2 mg total) by mouth every 6 (six) hours as needed. 60 tablet 0   No facility-administered medications prior to visit.    Allergies  Allergen Reactions  .  Codeine Anaphylaxis    Review of Systems  Constitutional: Negative.   Respiratory: Negative.   Cardiovascular: Negative.   Gastrointestinal: Positive for abdominal pain, constipation and diarrhea. Negative for blood in stool.       Objective:    Physical Exam Constitutional:      Appearance: She is well-developed.  Abdominal:     General: Abdomen is flat.     Palpations: Abdomen is soft.     Tenderness: There is abdominal tenderness in the right lower quadrant, left upper quadrant and left lower quadrant. There is no right CVA tenderness, left CVA tenderness or rebound.     Hernia: No hernia is present.  Neurological:     Mental Status: She is alert.     BP 128/77   Pulse 63   Temp 97.7 F (36.5 C)   Resp 18   Ht 5' 1"  (1.549 m)   Wt 171 lb (77.6 kg)   SpO2 93%   BMI 32.31 kg/m  Wt Readings from Last 3 Encounters:  07/14/20 171 lb (77.6 kg)  07/07/20 171 lb (77.6 kg)  06/06/20 170 lb (77.1 kg)    Health Maintenance Due  Topic Date Due  . Hepatitis C Screening  Never done  . HIV Screening  Never done  . TETANUS/TDAP  Never done  . COVID-19 Vaccine (3 - Booster for Moderna series) 07/06/2020    There are no preventive care reminders to display for this patient.   Lab Results  Component Value Date   TSH 33.600 (H) 05/21/2020   Lab Results  Component Value Date   WBC 7.1 07/07/2020   HGB 14.9 07/07/2020   HCT 42.0 07/07/2020   MCV 89 07/07/2020   PLT 317 07/07/2020   Lab Results  Component Value Date   NA 140 07/07/2020   K 4.2 07/07/2020   CO2 23 07/07/2020   GLUCOSE 100 (H) 07/07/2020   BUN 12  07/07/2020   CREATININE 0.97 07/07/2020   BILITOT 0.3 07/07/2020   ALKPHOS 67 07/07/2020   AST 16 07/07/2020   ALT 18 07/07/2020   PROT 6.7 07/07/2020   ALBUMIN 4.5 07/07/2020   CALCIUM 9.6 07/07/2020   ANIONGAP 9 08/30/2019   Lab Results  Component Value Date   CHOL 252 (H) 01/25/2020   Lab Results  Component Value Date   HDL 55 01/25/2020   Lab Results  Component Value Date   LDLCALC 172 (H) 01/25/2020   Lab Results  Component Value Date   TRIG 141 01/25/2020   Lab Results  Component Value Date   CHOLHDL 4.6 (H) 01/25/2020   No results found for: HGBA1C     Assessment & Plan:   Problem List Items Addressed This Visit      Other   Abdominal pain    -pain has not improved -her labs were unremarkable -will treat for diverticultis d/t LLQ pain being the worse; unsure if this is the etiology as she had hx of Crohns and kidney stones (her pain is colicky); she has GI symptoms so suspect GI etiology -recommend she contact Dr. Gala Romney, her GI doc -imaging is scheduled already          Meds ordered this encounter  Medications  . ciprofloxacin (CIPRO) 500 MG tablet    Sig: Take 1 tablet (500 mg total) by mouth 2 (two) times daily.    Dispense:  14 tablet    Refill:  0  . metroNIDAZOLE (FLAGYL) 500 MG tablet    Sig:  Take 1 tablet (500 mg total) by mouth 2 (two) times daily.    Dispense:  14 tablet    Refill:  0     Noreene Larsson, NP

## 2020-07-14 NOTE — Patient Instructions (Signed)
Or your abdominal pain, we have imaging scheduled. Your labs looked great. Please follow up with Dr. Gala Romney, and if your pain gets more severe please go to the emergency department.

## 2020-07-28 ENCOUNTER — Telehealth: Payer: Self-pay

## 2020-07-28 ENCOUNTER — Other Ambulatory Visit: Payer: Self-pay

## 2020-07-28 NOTE — Telephone Encounter (Signed)
Pt called wanting a refill on her Doxepin. I do not see that you have ever filled this for her but pt says you have? Please advise.

## 2020-07-29 ENCOUNTER — Telehealth: Payer: Self-pay

## 2020-07-29 NOTE — Telephone Encounter (Signed)
errpr

## 2020-07-29 NOTE — Telephone Encounter (Signed)
Please let her know I did not refill this one. I think she gets this from another provider. It is not a medication I usually prescribe or keep patients on.

## 2020-07-29 NOTE — Telephone Encounter (Signed)
Patient return called needs to speak to nurse about Doxepin.

## 2020-07-29 NOTE — Telephone Encounter (Signed)
Left message

## 2020-07-29 NOTE — Telephone Encounter (Signed)
She was set up with Psychiatry to help manage her medications as we did some trials in the mean time to help her.  Is she not seeing them anymore? If not I would like to get her back in with them. I can refill this, but she needs to be followed and managed by an expert in the field. It looks like Legrand Como also increased her Zoloft a month back as well- is this starting to help her?

## 2020-07-29 NOTE — Telephone Encounter (Signed)
Pt says she has been on this for about a year from her previous PCP, it's the only thing that helps her sleep. She wants to know if there is something you would be willing to put her on in place of this? Please advise.

## 2020-07-30 ENCOUNTER — Ambulatory Visit (HOSPITAL_COMMUNITY)
Admission: RE | Admit: 2020-07-30 | Discharge: 2020-07-30 | Disposition: A | Discharge status: Home / Self Care | Payer: Medicare HMO | Source: Ambulatory Visit | Attending: Nurse Practitioner | Admitting: Nurse Practitioner

## 2020-07-30 ENCOUNTER — Other Ambulatory Visit: Payer: Self-pay

## 2020-07-30 ENCOUNTER — Other Ambulatory Visit: Payer: Self-pay | Admitting: Nurse Practitioner

## 2020-07-30 DIAGNOSIS — K3189 Other diseases of stomach and duodenum: Secondary | ICD-10-CM | POA: Diagnosis not present

## 2020-07-30 DIAGNOSIS — N898 Other specified noninflammatory disorders of vagina: Secondary | ICD-10-CM | POA: Diagnosis not present

## 2020-07-30 DIAGNOSIS — D1809 Hemangioma of other sites: Secondary | ICD-10-CM | POA: Diagnosis not present

## 2020-07-30 DIAGNOSIS — R1032 Left lower quadrant pain: Secondary | ICD-10-CM | POA: Insufficient documentation

## 2020-07-30 DIAGNOSIS — N281 Cyst of kidney, acquired: Secondary | ICD-10-CM | POA: Diagnosis not present

## 2020-07-30 MED ORDER — IOHEXOL 300 MG/ML  SOLN
100.0000 mL | Freq: Once | INTRAMUSCULAR | Status: AC | PRN
  Administered 2020-07-30: 100 mL via INTRAVENOUS

## 2020-07-30 MED ORDER — BISACODYL 10 MG RE SUPP: 10.0000 mg | RECTAL | 0 refills | Status: DC | PRN

## 2020-07-30 NOTE — Progress Notes (Signed)
CT scan showed no acute abnormality in abdomen or pelvis. It did show moderate volume of formed stool, so I will send in a laxative suppository and see if that helps. She should use 1 per day as needed. If she is having good BMs, then she should not take this.

## 2020-07-30 NOTE — Telephone Encounter (Signed)
Left message

## 2020-07-31 DIAGNOSIS — E039 Hypothyroidism, unspecified: Secondary | ICD-10-CM | POA: Diagnosis not present

## 2020-07-31 NOTE — Telephone Encounter (Signed)
I have refilled her medication- please ask her to ask her psych provider if they are willing to do medication management since we have adjusted a few times without improvement. If not, if she is willing we can find someone who can help [place] with this.

## 2020-07-31 NOTE — Telephone Encounter (Signed)
Pt says she is seeing psych but they are not managing her meds. Victoria Deleon did increase her Zoloft but she hasn't noticed any difference. Please advise.

## 2020-07-31 NOTE — Telephone Encounter (Signed)
Left message

## 2020-08-01 ENCOUNTER — Other Ambulatory Visit: Payer: Self-pay | Admitting: Family Medicine

## 2020-08-01 ENCOUNTER — Other Ambulatory Visit: Payer: Self-pay

## 2020-08-01 ENCOUNTER — Ambulatory Visit (INDEPENDENT_AMBULATORY_CARE_PROVIDER_SITE_OTHER): Payer: Medicare HMO | Admitting: Nurse Practitioner

## 2020-08-01 ENCOUNTER — Encounter: Payer: Self-pay | Admitting: Nurse Practitioner

## 2020-08-01 VITALS — BP 118/69 | HR 64 | Ht 61.0 in | Wt 168.8 lb

## 2020-08-01 DIAGNOSIS — E038 Other specified hypothyroidism: Secondary | ICD-10-CM

## 2020-08-01 DIAGNOSIS — E063 Autoimmune thyroiditis: Secondary | ICD-10-CM | POA: Diagnosis not present

## 2020-08-01 LAB — T3, FREE: T3, Free: 2.8 pg/mL (ref 2.0–4.4)

## 2020-08-01 LAB — TSH: TSH: 34.6 u[IU]/mL — ABNORMAL HIGH (ref 0.450–4.500)

## 2020-08-01 LAB — T4, FREE: Free T4: 0.7 ng/dL — ABNORMAL LOW (ref 0.82–1.77)

## 2020-08-01 MED ORDER — DOXEPIN HCL 10 MG PO CAPS: 10.0000 mg | ORAL_CAPSULE | Freq: Every day | ORAL | 0 refills | Status: DC

## 2020-08-01 MED ORDER — TIROSINT 175 MCG PO CAPS: 175.0000 ug | ORAL_CAPSULE | Freq: Every day | ORAL | 3 refills | Status: DC

## 2020-08-01 NOTE — Patient Instructions (Signed)

## 2020-08-01 NOTE — Telephone Encounter (Signed)
Yes, I sent to [facility]. When she calls back- please place referral to provider of her choice- just make sure they handle her medications as well. Might need to call to verify.

## 2020-08-01 NOTE — Telephone Encounter (Signed)
Pt asked if you could send this to [place]?   Her psych dr is leaving anyways so she is going to look around this weekend and let us know who she wants new referral sent in to.

## 2020-08-01 NOTE — Progress Notes (Signed)
Endocrinology Follow up Visit                                        08/01/2020, 9:40 AM   Victoria Deleon is a 55 y.o.-year-old female patient being seen in follow up after being seen in consultation for hypothyroidism referred by Perlie Mayo, NP.  SUBJECTIVE:  Past Medical History:  Diagnosis Date  . Anxiety   . Arthritis   . Crohn disease ([place])   . Depression   . Depression    Phreesia 03/04/2020  . History of kidney stones   . Hyperlipidemia    Phreesia 03/04/2020  . Hypertension   . Hypothyroidism   . Macular degeneration   . Nonspecific abnormal electrocardiogram (ECG) (EKG) 07/27/2018  . Pain due to unicompartmental arthroplasty of knee ([place]) 09/06/2019  . PTSD (post-traumatic stress disorder)   . 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 Thyroid disease    Phreesia 03/04/2020    Past Surgical History:  Procedure Laterality Date  . ABDOMINAL HYSTERECTOMY    . APPENDECTOMY    . BIOPSY  04/24/2018   Procedure: BIOPSY;  Surgeon: Daneil Dolin, MD;  [facility];  Service: Endoscopy;;  (gastric/duodenal)  . CHOLECYSTECTOMY    . COLONOSCOPY WITH PROPOFOL N/A 04/24/2018   Dr. Gala Romney: Normal terminal ileum with negative biopsies, normal-appearing colon with random colon biopsies negative, internal hemorrhoids.  Next colonoscopy 10 years.  . ESOPHAGOGASTRODUODENOSCOPY (EGD) WITH PROPOFOL N/A 04/24/2018   Dr. Gala Romney: Severe ulcerative reflux esophagitis, small hiatal hernia, small bowel biopsies negative for celiac.  . EYE SURGERY N/A    Phreesia 03/04/2020  . JOINT REPLACEMENT N/A    Phreesia 01/22/2020  . MEDIAL PARTIAL KNEE REPLACEMENT Left   . TOTAL KNEE REVISION Left 09/10/2019   Procedure: LEFT TOTAL KNEE REVISION;  Surgeon: Frederik Pear, MD;  Location: [facility];  Service: Orthopedics;  Laterality: Left;    Social History   Socioeconomic History  .  Marital status: Married    Spouse name: Victoria Deleon   . Number of children: Not on file  . Years of education: Not on file  . Highest education level: Not on file  Occupational History    Comment: medical disability   Tobacco Use  . Smoking status: Current Every Day Smoker    Packs/day: 0.25    Years: 30.00    Pack years: 7.50    Types: Cigarettes  . Smokeless tobacco: Never Used  Vaping Use  . Vaping Use: Never used  Substance and Sexual Activity  . Alcohol use: Yes    Comment: rare-maybe 2 glasses of wine/year  . Drug use: Never  . Sexual activity: Not Currently    Birth control/protection: Surgical    Comment: hyst  Other Topics Concern  . Not on file  Social History Narrative   Lives with husband Victoria Deleon          Enjoys: kayaking, outside events  Diet: eats all food groups outside meat    Caffeine: tea 2 cups daily    Water: 5-6 cups daily or more      Wears seat belt   Does not use phone while driving    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, fast food safe area   Social Determinants of Health   Financial Resource Strain: Medium Risk  . Difficulty of Paying Living Expenses: Somewhat hard  Food Insecurity: No Food Insecurity  . Worried About Charity fundraiser in the Last Year: Never true  . Ran Out of Food in the Last Year: Never true  Transportation Needs: No Transportation Needs  . Lack of Transportation (Medical): No  . Lack of Transportation (Non-Medical): No  Physical Activity: Sufficiently Active  . Days of Exercise per Week: 4 days  . Minutes of Exercise per Session: 50 min  Stress: No Stress Concern Present  . Feeling of Stress : Only a little  Social Connections: Socially Isolated  . Frequency of Communication with Friends and Family: Never  . Frequency of Social Gatherings with Friends and Family: Never  . Attends Religious Services: Never  . Active Member of Clubs or Organizations: No  . Attends Archivist Meetings: Never  . Marital  Status: Married    Family History  Adopted: Yes  Problem Relation Age of Onset  . Colon cancer Neg Hx     Outpatient Encounter Medications as of 08/01/2020  Medication Sig  . atorvastatin (LIPITOR) 80 MG tablet Take 1 tablet (80 mg total) by mouth at bedtime.  . bisacodyl (DULCOLAX) 10 MG suppository Place 1 suppository (10 mg total) rectally as needed for moderate constipation.  . busPIRone (BUSPAR) 7.5 MG tablet Take 1 tablet (7.5 mg total) by mouth 3 (three) times daily.  . ciprofloxacin (CIPRO) 500 MG tablet Take 1 tablet (500 mg total) by mouth 2 (two) times daily.  . clobetasol cream (TEMOVATE) 6.38 % Apply 1 application topically 2 (two) times daily. Use bid for 2 weeks then daily till seen  . doxepin (SINEQUAN) 10 MG capsule Take 10 mg by mouth.  . FIBER PO Take 1 capsule by mouth 2 (two) times daily.   Marland Kitchen gabapentin (NEURONTIN) 100 MG capsule Take 1 capsule (100 mg total) by mouth 3 (three) times daily.  Marland Kitchen linaclotide (LINZESS) 72 MCG capsule Take 1 capsule (72 mcg total) by mouth daily.  . methocarbamol (ROBAXIN) 500 MG tablet Take 500 mg by mouth 2 (two) times daily.  . metoprolol succinate (TOPROL-XL) 25 MG 24 hr tablet Take 1 tablet (25 mg total) by mouth daily.  . metroNIDAZOLE (FLAGYL) 500 MG tablet Take 1 tablet (500 mg total) by mouth 2 (two) times daily.  . Multiple Vitamin (MULTIVITAMIN WITH MINERALS) TABS tablet Take 1 tablet by mouth daily.  . pantoprazole (PROTONIX) 40 MG tablet Take 1 tablet (40 mg total) by mouth daily.  . sertraline (ZOLOFT) 100 MG tablet Take 1.5 tablets (150 mg total) by mouth daily.  Marland Kitchen TIROSINT 175 MCG CAPS Take 175 mcg by mouth daily before breakfast.  . tiZANidine (ZANAFLEX) 2 MG tablet Take 1 tablet (2 mg total) by mouth every 6 (six) hours as needed.  . [DISCONTINUED] TIROSINT 150 MCG CAPS Take 1 capsule (150 mcg total) by mouth daily before breakfast.   No facility-administered encounter medications on file as of 08/01/2020.     ALLERGIES: Allergies  Allergen Reactions  . Codeine Anaphylaxis   VACCINATION STATUS: Immunization History  Administered Date(s) Administered  . Moderna Sars-Covid-2 Vaccination 11/15/2019, 01/04/2020     HPI    Victoria Deleon  is a patient with the above medical history. she was diagnosed with hypothyroidism at approximate age of 42 years, which required subsequent initiation of Levothyroxine therapy. she was given various doses of thyroid hormone replacement over the years.  she reports compliance to this medication:  Taking it daily on empty stomach with water, separated by >30 minutes before breakfast and other medications, and by at least 4 hours from calcium, iron, PPIs, multivitamins.  I reviewed patient's thyroid tests:  Lab Results  Component Value Date   TSH 34.600 (H) 07/31/2020   TSH 33.600 (H) 05/21/2020   TSH 31.100 (H) 04/14/2020   TSH 32.800 (H) 03/10/2020   TSH 20.000 (H) 01/29/2020   TSH 17.800 (H) 01/25/2020   FREET4 0.70 (L) 07/31/2020   FREET4 0.72 (L) 05/21/2020   FREET4 0.77 (L) 01/29/2020     Pt describes: - weight gain - profound fatigue - depression/ anxiety - constipation - headaches  Pt denies feeling nodules in neck, hoarseness, dysphagia/odynophagia, SOB with lying down.  she does not know whether she has a family history of thyroid disorders because she was adopted. 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 She denies recent use of iodine supplements or Biotin containing supplements and stopped eating soy products as much (she is vegan).   Her TPO and thyroglobulin antibodies were positive indicating autoimmune Hashimoto's thyroiditis as the cause for her hypothyroidism.  Her baseline thyroid ultrasound showed heterogeneous thyroid tissue without suspicious nodules further suggesting autoimmune thyroid  disease.   I reviewed her chart and she also has a history of crohn's disease.   ROS:  Constitutional: stable weight, + profound fatigue, no subjective hyperthermia, no subjective hypothermia Eyes: no blurry vision, no xerophthalmia ENT: no sore throat, no nodules palpated in throat, no dysphagia/odynophagia, no hoarseness Cardiovascular: no Chest Pain, no Shortness of Breath, no palpitations, no leg swelling Respiratory: no cough, no SOB Gastrointestinal: no nausea/Vomiting, + intermittent constipation and diarrhea (has history of Crohn's disease and has good days and bad days)- recent GI study showed bowel blockage Musculoskeletal: no muscle/joint aches Skin: no rashes Neurological: no tremors, no numbness, no tingling, no dizziness Psychiatric: + depression / anxiety (hx of PTSD)    OBJECTIVE:  BP 118/69 (BP Location: Left Arm, Patient Position: Sitting)   Pulse 64   Ht 5' 1"  (1.549 m)   Wt 168 lb 12.8 oz (76.6 kg)   BMI 31.89 kg/m  Wt Readings from Last 3 Encounters:  08/01/20 168 lb 12.8 oz (76.6 kg)  07/14/20 171 lb (77.6 kg)  07/07/20 171 lb (77.6 kg)   BP Readings from Last 3 Encounters:  08/01/20 118/69  07/14/20 128/77  07/07/20 114/76      Physical Exam- Limited  Constitutional:  Body mass index is 31.89 kg/m., not in acute distress, normal state of mind Eyes:  EOMI, no exophthalmos Neck: Supple Cardiovascular: RRR, no murmers, rubs, or gallops, no edema Respiratory: Adequate breathing efforts, no crackles, rales, rhonchi, or wheezing Musculoskeletal: no gross deformities, strength intact in all four extremities, no gross restriction of joint movements Skin:  no rashes, no hyperemia Neurological: + mild tremor with outstretched hands   CMP ( most recent) CMP     Component Value Date/Time   NA 140 07/07/2020 1112  K 4.2 07/07/2020 1112   CL 103 07/07/2020 1112   CO2 23 07/07/2020 1112   GLUCOSE 100 (H) 07/07/2020 1112   GLUCOSE 114 (H)  08/30/2019 0926   BUN 12 07/07/2020 1112   CREATININE 0.97 07/07/2020 1112   CALCIUM 9.6 07/07/2020 1112   PROT 6.7 07/07/2020 1112   ALBUMIN 4.5 07/07/2020 1112   AST 16 07/07/2020 1112   ALT 18 07/07/2020 1112   ALKPHOS 67 07/07/2020 1112   BILITOT 0.3 07/07/2020 1112   GFRNONAA 66 07/07/2020 1112   GFRAA 76 07/07/2020 1112     Diabetic Labs (most recent): No results found for: HGBA1C   Lipid Panel ( most recent) Lipid Panel     Component Value Date/Time   CHOL 252 (H) 01/25/2020 1024   TRIG 141 01/25/2020 1024   HDL 55 01/25/2020 1024   CHOLHDL 4.6 (H) 01/25/2020 1024   LDLCALC 172 (H) 01/25/2020 1024   LABVLDL 25 01/25/2020 1024       Lab Results  Component Value Date   TSH 34.600 (H) 07/31/2020   TSH 33.600 (H) 05/21/2020   TSH 31.100 (H) 04/14/2020   TSH 32.800 (H) 03/10/2020   TSH 20.000 (H) 01/29/2020   TSH 17.800 (H) 01/25/2020   FREET4 0.70 (L) 07/31/2020   FREET4 0.72 (L) 05/21/2020   FREET4 0.77 (L) 01/29/2020       ASSESSMENT / PLAN:  1. Hypothyroidism due to Hashimoto's thyroiditis:  -Her previsit thyroid function tests are still consistent with under-replacement.  She is advised to increase her Tirosint to 175 mcg po daily before breakfast.   -It appears she does have a medication absorption issue which may be related to her chronic PPI use and Crohn's disease contributing to her need for higher doses of thyroid hormone.  - We discussed about correct intake of levothyroxine, at fasting, with water, separated by at least 30 minutes from breakfast, and separated by more than 4 hours from calcium, iron, multivitamins, acid reflux medications (PPIs). -Patient is made aware of the fact that thyroid hormone replacement is needed for life, dose to be adjusted by periodic monitoring of thyroid function tests.  I will have her return in 6 weeks with repeat labs to assess her response to change in medication.        - Time spent on this patient  care encounter:  20 minutes of which 50% was spent in  counseling and the rest reviewing  her current and  previous labs / studies and medications  doses and developing a plan for long term care, and documenting this care. Arlyce Dice  participated in the discussions, expressed understanding, and voiced agreement with the above plans.  All questions were answered to her satisfaction. she is encouraged to contact clinic should she have any questions or concerns prior to her return visit.    FOLLOW UP PLAN:  Return in about 6 weeks (around 09/12/2020) for Thyroid follow up, Previsit labs, Virtual visit ok.  Rayetta Pigg, [facility] [address] [place],  [address] Phone: [phone] Fax: [phone]  08/01/2020, 9:40 AM

## 2020-08-11 ENCOUNTER — Other Ambulatory Visit: Payer: Self-pay | Admitting: Family Medicine

## 2020-08-11 NOTE — Telephone Encounter (Signed)
Left message

## 2020-08-19 ENCOUNTER — Other Ambulatory Visit: Payer: Self-pay

## 2020-08-19 DIAGNOSIS — M47816 Spondylosis without myelopathy or radiculopathy, lumbar region: Secondary | ICD-10-CM | POA: Diagnosis not present

## 2020-08-19 DIAGNOSIS — E038 Other specified hypothyroidism: Secondary | ICD-10-CM

## 2020-08-19 MED ORDER — LEVOTHYROXINE SODIUM 175 MCG PO TABS: 175.0000 ug | ORAL_TABLET | Freq: Every day | ORAL | 1 refills | Status: DC

## 2020-08-21 DIAGNOSIS — F411 Generalized anxiety disorder: Secondary | ICD-10-CM | POA: Diagnosis not present

## 2020-08-27 ENCOUNTER — Other Ambulatory Visit: Payer: Self-pay

## 2020-08-27 ENCOUNTER — Encounter: Payer: Self-pay | Admitting: Internal Medicine

## 2020-08-27 ENCOUNTER — Ambulatory Visit (INDEPENDENT_AMBULATORY_CARE_PROVIDER_SITE_OTHER): Payer: Medicare HMO | Admitting: Internal Medicine

## 2020-08-27 VITALS — BP 130/78 | HR 66 | Resp 16 | Ht 61.0 in | Wt 167.1 lb

## 2020-08-27 DIAGNOSIS — Z9109 Other allergy status, other than to drugs and biological substances: Secondary | ICD-10-CM

## 2020-08-27 DIAGNOSIS — Z23 Encounter for immunization: Secondary | ICD-10-CM | POA: Diagnosis not present

## 2020-08-27 DIAGNOSIS — E038 Other specified hypothyroidism: Secondary | ICD-10-CM | POA: Diagnosis not present

## 2020-08-27 DIAGNOSIS — E063 Autoimmune thyroiditis: Secondary | ICD-10-CM | POA: Diagnosis not present

## 2020-08-27 DIAGNOSIS — F419 Anxiety disorder, unspecified: Secondary | ICD-10-CM

## 2020-08-27 MED ORDER — EPINEPHRINE 0.3 MG/0.3ML IJ SOAJ: 0.3000 mg | INTRAMUSCULAR | 2 refills | Status: AC | PRN

## 2020-08-27 NOTE — Assessment & Plan Note (Signed)
Lab Results  Component Value Date   TSH 34.600 (H) 07/31/2020   On Levothyroxine 137 mcg QD Continue to follow up with Endocrinology

## 2020-08-27 NOTE — Patient Instructions (Signed)
Please continue taking medications as prescribed.  Please maintain simple sleep hygiene. - Maintain dark and non-noisy environment in the bedroom. - Please use the bedroom for sleep and sexual activity only. - Do not use electronic devices in the bedroom. - Please take dinner at least 2 hours before bedtime. - Please avoid caffeinated products in the evening, including coffee, soft drinks. - Please try to maintain the regular sleep-wake cycle - Go to bed and wake up at the same time.

## 2020-08-27 NOTE — Progress Notes (Signed)
Established Patient Office Visit  Subjective:  Patient ID: Victoria Deleon, female    DOB: [birth year]  Age: 55 y.o. MRN: [id number]  CC:  Chief Complaint  Patient presents with  . Memory Loss    Pt has been having bad memory loss for about a month nothing has changed in the month however spouse has also noticed memory loss also needs refill on epi pen    HPI Victoria Deleon presents for follow up of anxiety and memory concerns.  She has been forgetting tasks at home. She states that she forgot her vehicle in the parking lot, which made her concerned about memory loss. Her husband has also realized that she is as organized as she used to be. Her MMSE was 28/30 today. Of note, she has h/o insomnia, which is not well controlled. She takes Doxepin for it. She sleeps about 5-6 hours/day.  Past Medical History:  Diagnosis Date  . Anxiety   . Arthritis   . Crohn disease ([place])   . Depression   . Depression    Phreesia 03/04/2020  . History of kidney stones   . Hyperlipidemia    Phreesia 03/04/2020  . Hypertension   . Hypothyroidism   . Macular degeneration   . Nonspecific abnormal electrocardiogram (ECG) (EKG) 07/27/2018  . Pain due to unicompartmental arthroplasty of knee ([place]) 09/06/2019  . PTSD (post-traumatic stress disorder)   . 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 Thyroid disease    Phreesia 03/04/2020    Past Surgical History:  Procedure Laterality Date  . ABDOMINAL HYSTERECTOMY    . APPENDECTOMY    . BIOPSY  04/24/2018   Procedure: BIOPSY;  Surgeon: Daneil Dolin, MD;  [facility];  Service: Endoscopy;;  (gastric/duodenal)  . CHOLECYSTECTOMY    . COLONOSCOPY WITH PROPOFOL N/A 04/24/2018   Dr. Gala Romney: Normal terminal ileum with negative biopsies, normal-appearing colon with random colon biopsies negative, internal hemorrhoids.  Next colonoscopy 10 years.  . ESOPHAGOGASTRODUODENOSCOPY (EGD) WITH PROPOFOL N/A 04/24/2018    Dr. Gala Romney: Severe ulcerative reflux esophagitis, small hiatal hernia, small bowel biopsies negative for celiac.  . EYE SURGERY N/A    Phreesia 03/04/2020  . JOINT REPLACEMENT N/A    Phreesia 01/22/2020  . MEDIAL PARTIAL KNEE REPLACEMENT Left   . TOTAL KNEE REVISION Left 09/10/2019   Procedure: LEFT TOTAL KNEE REVISION;  Surgeon: Frederik Pear, MD;  Location: [facility];  Service: Orthopedics;  Laterality: Left;    Family History  Adopted: Yes  Problem Relation Age of Onset  . Colon cancer Neg Hx     Social History   Socioeconomic History  . Marital status: Married    Spouse name: Fritz Pickerel   . Number of children: Not on file  . Years of education: Not on file  . Highest education level: Not on file  Occupational History    Comment: medical disability   Tobacco Use  . Smoking status: Current Every Day Smoker    Packs/day: 0.25    Years: 30.00    Pack years: 7.50    Types: Cigarettes  . Smokeless tobacco: Never Used  Vaping Use  . Vaping Use: Never used  Substance and Sexual Activity  . Alcohol use: Yes    Comment: rare-maybe 2 glasses of wine/year  . Drug use: Never  . Sexual activity: Not Currently    Birth control/protection: Surgical    Comment: hyst  Other Topics Concern  . Not on file  Social History Narrative   Lives with husband Fritz Pickerel          Enjoys: kayaking, outside events       Diet: eats all food groups outside meat    Caffeine: tea 2 cups daily    Water: 5-6 cups daily or more      Wears seat belt   Does not use phone while driving    Arboriculturist safe area   Social Determinants of Health   Financial Resource Strain: Medium Risk  . Difficulty of Paying Living Expenses: Somewhat hard  Food Insecurity: No Food Insecurity  . Worried About Charity fundraiser in the Last Year: Never true  . Ran Out of Food in the Last Year: Never true  Transportation Needs: No Transportation Needs  . Lack of Transportation (Medical):  No  . Lack of Transportation (Non-Medical): No  Physical Activity: Sufficiently Active  . Days of Exercise per Week: 4 days  . Minutes of Exercise per Session: 50 min  Stress: No Stress Concern Present  . Feeling of Stress : Only a little  Social Connections: Socially Isolated  . Frequency of Communication with Friends and Family: Never  . Frequency of Social Gatherings with Friends and Family: Never  . Attends Religious Services: Never  . Active Member of Clubs or Organizations: No  . Attends Archivist Meetings: Never  . Marital Status: Married  Human resources officer Violence: Not At Risk  . Fear of Current or Ex-Partner: No  . Emotionally Abused: No  . Physically Abused: No  . Sexually Abused: No    Outpatient Medications Prior to Visit  Medication Sig Dispense Refill  . atorvastatin (LIPITOR) 80 MG tablet Take 1 tablet (80 mg total) by mouth at bedtime. 90 tablet 0  . bisacodyl (DULCOLAX) 10 MG suppository Place 1 suppository (10 mg total) rectally as needed for moderate constipation. 12 suppository 0  . busPIRone (BUSPAR) 7.5 MG tablet Take 1 tablet (7.5 mg total) by mouth 3 (three) times daily. 90 tablet 1  . clobetasol cream (TEMOVATE) 3.08 % Apply 1 application topically 2 (two) times daily. Use bid for 2 weeks then daily till seen 30 g 1  . doxepin (SINEQUAN) 10 MG capsule Take 1 capsule (10 mg total) by mouth at bedtime. 90 capsule 0  . FIBER PO Take 1 capsule by mouth 2 (two) times daily.     Marland Kitchen gabapentin (NEURONTIN) 100 MG capsule Take 1 capsule (100 mg total) by mouth 3 (three) times daily. 90 capsule 2  . levothyroxine (EUTHYROX) 175 MCG tablet Take 1 tablet (175 mcg total) by mouth daily before breakfast. 30 tablet 1  . LINZESS 72 MCG capsule TAKE 1 CAPSULE BY MOUTH ONCE DAILY. 30 capsule 0  . methocarbamol (ROBAXIN) 500 MG tablet Take 500 mg by mouth 2 (two) times daily.    . metoprolol succinate (TOPROL-XL) 25 MG 24 hr tablet Take 1 tablet (25 mg total) by mouth  daily. 30 tablet 3  . Multiple Vitamin (MULTIVITAMIN WITH MINERALS) TABS tablet Take 1 tablet by mouth daily.    . pantoprazole (PROTONIX) 40 MG tablet TAKE 1 TABLET BY MOUTH ONCE DAILY. 30 tablet 0  . sertraline (ZOLOFT) 100 MG tablet Take 1.5 tablets (150 mg total) by mouth daily. 45 tablet 1  . tiZANidine (ZANAFLEX) 2 MG tablet Take 1 tablet (2 mg total) by mouth every 6 (six) hours as needed. 60 tablet 0  .  ciprofloxacin (CIPRO) 500 MG tablet Take 1 tablet (500 mg total) by mouth 2 (two) times daily. 14 tablet 0  . metroNIDAZOLE (FLAGYL) 500 MG tablet Take 1 tablet (500 mg total) by mouth 2 (two) times daily. 14 tablet 0   No facility-administered medications prior to visit.    Allergies  Allergen Reactions  . Codeine Anaphylaxis    ROS Review of Systems  Constitutional: Negative for chills and fever.  HENT: Negative for congestion, sinus pressure, sinus pain and sore throat.   Eyes: Negative for pain and discharge.  Respiratory: Negative for cough and shortness of breath.   Cardiovascular: Negative for chest pain and palpitations.  Gastrointestinal: Negative for abdominal pain, constipation, diarrhea, nausea and vomiting.  Endocrine: Negative for polydipsia and polyuria.  Genitourinary: Negative for dysuria and hematuria.  Musculoskeletal: Negative for neck pain and neck stiffness.  Skin: Negative for rash.  Neurological: Negative for dizziness and weakness.  Psychiatric/Behavioral: Positive for sleep disturbance. Negative for agitation and behavioral problems.      Objective:    Physical Exam Vitals reviewed.  Constitutional:      General: She is not in acute distress.    Appearance: She is not diaphoretic.  HENT:     Head: Normocephalic and atraumatic.     Nose: Nose normal. No congestion.     Mouth/Throat:     Mouth: Mucous membranes are moist.     Pharynx: No posterior oropharyngeal erythema.  Eyes:     General: No scleral icterus.    Extraocular Movements:  Extraocular movements intact.     Pupils: Pupils are equal, round, and reactive to light.  Cardiovascular:     Rate and Rhythm: Normal rate and regular rhythm.     Pulses: Normal pulses.     Heart sounds: Normal heart sounds. No murmur heard.   Pulmonary:     Breath sounds: Normal breath sounds. No wheezing or rales.  Abdominal:     Palpations: Abdomen is soft.     Tenderness: There is no abdominal tenderness.  Musculoskeletal:     Cervical back: Neck supple. No tenderness.     Right lower leg: No edema.     Left lower leg: No edema.  Skin:    General: Skin is warm.     Findings: No rash.  Neurological:     General: No focal deficit present.     Mental Status: She is alert and oriented to person, place, and time.  Psychiatric:        Mood and Affect: Mood normal.        Behavior: Behavior normal.     BP 130/78 (BP Location: Right Arm, Patient Position: Sitting, Cuff Size: Normal)   Pulse 66   Resp 16   Ht 5' 1"  (1.549 m)   Wt 167 lb 1.9 oz (75.8 kg)   SpO2 96%   BMI 31.58 kg/m  Wt Readings from Last 3 Encounters:  08/27/20 167 lb 1.9 oz (75.8 kg)  08/01/20 168 lb 12.8 oz (76.6 kg)  07/14/20 171 lb (77.6 kg)     Health Maintenance Due  Topic Date Due  . Hepatitis C Screening  Never done  . HIV Screening  Never done    There are no preventive care reminders to display for this patient.  Lab Results  Component Value Date   TSH 34.600 (H) 07/31/2020   Lab Results  Component Value Date   WBC 7.1 07/07/2020   HGB 14.9 07/07/2020   HCT 42.0 07/07/2020  MCV 89 07/07/2020   PLT 317 07/07/2020   Lab Results  Component Value Date   NA 140 07/07/2020   K 4.2 07/07/2020   CO2 23 07/07/2020   GLUCOSE 100 (H) 07/07/2020   BUN 12 07/07/2020   CREATININE 0.97 07/07/2020   BILITOT 0.3 07/07/2020   ALKPHOS 67 07/07/2020   AST 16 07/07/2020   ALT 18 07/07/2020   PROT 6.7 07/07/2020   ALBUMIN 4.5 07/07/2020   CALCIUM 9.6 07/07/2020   ANIONGAP 9 08/30/2019    Lab Results  Component Value Date   CHOL 252 (H) 01/25/2020   Lab Results  Component Value Date   HDL 55 01/25/2020   Lab Results  Component Value Date   LDLCALC 172 (H) 01/25/2020   Lab Results  Component Value Date   TRIG 141 01/25/2020   Lab Results  Component Value Date   CHOLHDL 4.6 (H) 01/25/2020   No results found for: HGBA1C    Assessment & Plan:   Problem List Items Addressed This Visit      Endocrine   Hypothyroidism    Lab Results  Component Value Date   TSH 34.600 (H) 07/31/2020   On Levothyroxine 137 mcg QD Continue to follow up with Endocrinology        Other   Anxiety    On Zoloft 150 mg QD Doxepin for insomnia       Other Visit Diagnoses    Need for viral immunization    -  Primary   Relevant Orders   Tdap vaccine greater than or equal to 7yo IM (Completed)   Environmental allergies       Relevant Medications   EPINEPHrine 0.3 mg/0.3 mL IJ SOAJ injection      Meds ordered this encounter  Medications  . EPINEPHrine 0.3 mg/0.3 mL IJ SOAJ injection    Sig: Inject 0.3 mg into the muscle as needed for anaphylaxis.    Dispense:  1 each    Refill:  2    Follow-up: Return if symptoms worsen or fail to improve.    Lindell Spar, MD

## 2020-08-27 NOTE — Assessment & Plan Note (Addendum)
On Zoloft 150 mg QD Doxepin for insomnia

## 2020-08-28 DIAGNOSIS — F411 Generalized anxiety disorder: Secondary | ICD-10-CM | POA: Diagnosis not present

## 2020-09-02 DIAGNOSIS — M47816 Spondylosis without myelopathy or radiculopathy, lumbar region: Secondary | ICD-10-CM | POA: Diagnosis not present

## 2020-09-09 DIAGNOSIS — E038 Other specified hypothyroidism: Secondary | ICD-10-CM | POA: Diagnosis not present

## 2020-09-09 DIAGNOSIS — E063 Autoimmune thyroiditis: Secondary | ICD-10-CM | POA: Diagnosis not present

## 2020-09-10 LAB — T4, FREE: Free T4: 1.05 ng/dL (ref 0.82–1.77)

## 2020-09-10 LAB — TSH: TSH: 12.5 u[IU]/mL — ABNORMAL HIGH (ref 0.450–4.500)

## 2020-09-15 ENCOUNTER — Other Ambulatory Visit: Payer: Self-pay

## 2020-09-15 ENCOUNTER — Telehealth (INDEPENDENT_AMBULATORY_CARE_PROVIDER_SITE_OTHER): Payer: Medicare HMO | Admitting: Nurse Practitioner

## 2020-09-15 ENCOUNTER — Encounter: Payer: Self-pay | Admitting: Nurse Practitioner

## 2020-09-15 DIAGNOSIS — E038 Other specified hypothyroidism: Secondary | ICD-10-CM

## 2020-09-15 DIAGNOSIS — E063 Autoimmune thyroiditis: Secondary | ICD-10-CM | POA: Diagnosis not present

## 2020-09-15 MED ORDER — LEVOTHYROXINE SODIUM 200 MCG PO TABS: 200.0000 ug | ORAL_TABLET | Freq: Every day | ORAL | 3 refills | Status: DC

## 2020-09-15 NOTE — Patient Instructions (Signed)

## 2020-09-15 NOTE — Progress Notes (Signed)
Endocrinology Follow up Visit                                        09/15/2020, 8:35 AM    TELEHEALTH VISIT: The patient is being engaged in telehealth visit due to COVID-19.  This type of visit limits physical examination significantly, and thus is not preferable over face-to-face encounters.  I connected with  Claudine Stallings on 09/15/20 by a video enabled telemedicine application and verified that I am speaking with the correct person using two identifiers.   I discussed the limitations of evaluation and management by telemedicine. The patient expressed understanding and agreed to proceed.    The participants involved in this visit include: Brita Romp, NP located at [facility] and [place]  located at their personal residence listed.      Victoria Deleon is a 55 y.o.-year-old female patient being seen in follow up after being seen in consultation for hypothyroidism referred by Perlie Mayo, NP.  SUBJECTIVE:  Past Medical History:  Diagnosis Date  . Anxiety   . Arthritis   . Crohn disease ([place])   . Depression   . Depression    Phreesia 03/04/2020  . History of kidney stones   . Hyperlipidemia    Phreesia 03/04/2020  . Hypertension   . Hypothyroidism   . Macular degeneration   . Nonspecific abnormal electrocardiogram (ECG) (EKG) 07/27/2018  . Pain due to unicompartmental arthroplasty of knee ([place]) 09/06/2019  . PTSD (post-traumatic stress disorder)   . 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 Thyroid disease    Phreesia 03/04/2020    Past Surgical History:  Procedure Laterality Date  . ABDOMINAL HYSTERECTOMY    . APPENDECTOMY    . BIOPSY  04/24/2018   Procedure: BIOPSY;  Surgeon: Daneil Dolin, MD;  [facility];  Service: Endoscopy;;  (gastric/duodenal)  . CHOLECYSTECTOMY    . COLONOSCOPY WITH PROPOFOL N/A  04/24/2018   Dr. Gala Romney: Normal terminal ileum with negative biopsies, normal-appearing colon with random colon biopsies negative, internal hemorrhoids.  Next colonoscopy 10 years.  . ESOPHAGOGASTRODUODENOSCOPY (EGD) WITH PROPOFOL N/A 04/24/2018   Dr. Gala Romney: Severe ulcerative reflux esophagitis, small hiatal hernia, small bowel biopsies negative for celiac.  . EYE SURGERY N/A    Phreesia 03/04/2020  . JOINT REPLACEMENT N/A    Phreesia 01/22/2020  . MEDIAL PARTIAL KNEE REPLACEMENT Left   . TOTAL KNEE REVISION Left 09/10/2019   Procedure: LEFT TOTAL KNEE REVISION;  Surgeon: Frederik Pear, MD;  Location: [facility];  Service: Orthopedics;  Laterality: Left;    Social History   Socioeconomic History  . Marital status: Married    Spouse name: Victoria Deleon   . Number of children: Not on file  . Years of education: Not on file  . Highest education level: Not on file  Occupational History    Comment: medical disability   Tobacco Use  . Smoking status: Current Every Day Smoker  Packs/day: 0.25    Years: 30.00    Pack years: 7.50    Types: Cigarettes  . Smokeless tobacco: Never Used  Vaping Use  . Vaping Use: Never used  Substance and Sexual Activity  . Alcohol use: Yes    Comment: rare-maybe 2 glasses of wine/year  . Drug use: Never  . Sexual activity: Not Currently    Birth control/protection: Surgical    Comment: hyst  Other Topics Concern  . Not on file  Social History Narrative   Lives with husband Victoria Deleon          Enjoys: kayaking, outside events       Diet: eats all food groups outside meat    Caffeine: tea 2 cups daily    Water: 5-6 cups daily or more      Wears seat belt   Does not use phone while driving    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, fast food safe area   Social Determinants of Health   Financial Resource Strain: Medium Risk  . Difficulty of Paying Living Expenses: Somewhat hard  Food Insecurity: No Food Insecurity  . Worried About Charity fundraiser in the  Last Year: Never true  . Ran Out of Food in the Last Year: Never true  Transportation Needs: No Transportation Needs  . Lack of Transportation (Medical): No  . Lack of Transportation (Non-Medical): No  Physical Activity: Sufficiently Active  . Days of Exercise per Week: 4 days  . Minutes of Exercise per Session: 50 min  Stress: No Stress Concern Present  . Feeling of Stress : Only a little  Social Connections: Socially Isolated  . Frequency of Communication with Friends and Family: Never  . Frequency of Social Gatherings with Friends and Family: Never  . Attends Religious Services: Never  . Active Member of Clubs or Organizations: No  . Attends Archivist Meetings: Never  . Marital Status: Married    Family History  Adopted: Yes  Problem Relation Age of Onset  . Colon cancer Neg Hx     Outpatient Encounter Medications as of 09/15/2020  Medication Sig  . atorvastatin (LIPITOR) 80 MG tablet Take 1 tablet (80 mg total) by mouth at bedtime.  . bisacodyl (DULCOLAX) 10 MG suppository Place 1 suppository (10 mg total) rectally as needed for moderate constipation.  . busPIRone (BUSPAR) 7.5 MG tablet Take 1 tablet (7.5 mg total) by mouth 3 (three) times daily.  Marland Kitchen doxepin (SINEQUAN) 25 MG capsule TAKE 1 CAPSULE BY MOUTHHONCE AT BEDTIME.  Marland Kitchen EPINEPHrine 0.3 mg/0.3 mL IJ SOAJ injection Inject 0.3 mg into the muscle as needed for anaphylaxis.  Marland Kitchen FIBER PO Take 1 capsule by mouth 2 (two) times daily.   Marland Kitchen gabapentin (NEURONTIN) 300 MG capsule TAKE 1 CAPSULE BY MOUTHCTHREE TIMES A DAY.  Marland Kitchen LINZESS 72 MCG capsule TAKE 1 CAPSULE BY MOUTH ONCE DAILY.  . methocarbamol (ROBAXIN) 500 MG tablet Take 500 mg by mouth 2 (two) times daily.  . metoprolol succinate (TOPROL-XL) 25 MG 24 hr tablet Take 1 tablet (25 mg total) by mouth daily.  . Multiple Vitamin (MULTIVITAMIN WITH MINERALS) TABS tablet Take 1 tablet by mouth daily.  . pantoprazole (PROTONIX) 40 MG tablet TAKE 1 TABLET BY MOUTH ONCE DAILY.   Marland Kitchen sertraline (ZOLOFT) 100 MG tablet Take 1.5 tablets (150 mg total) by mouth daily.  Marland Kitchen tiZANidine (ZANAFLEX) 2 MG tablet Take 1 tablet (2 mg total) by mouth every 6 (six) hours as needed.  . [  DISCONTINUED] doxepin (SINEQUAN) 10 MG capsule Take 1 capsule (10 mg total) by mouth at bedtime.  . [DISCONTINUED] gabapentin (NEURONTIN) 100 MG capsule Take 1 capsule (100 mg total) by mouth 3 (three) times daily.  . [DISCONTINUED] levothyroxine (EUTHYROX) 175 MCG tablet Take 1 tablet (175 mcg total) by mouth daily before breakfast.  . levothyroxine (EUTHYROX) 200 MCG tablet Take 1 tablet (200 mcg total) by mouth daily before breakfast.  . [DISCONTINUED] clobetasol cream (TEMOVATE) 0.48 % Apply 1 application topically 2 (two) times daily. Use bid for 2 weeks then daily till seen   No facility-administered encounter medications on file as of 09/15/2020.    ALLERGIES: Allergies  Allergen Reactions  . Codeine Anaphylaxis   VACCINATION STATUS: Immunization History  Administered Date(s) Administered  . Moderna Sars-Covid-2 Vaccination 11/15/2019, 01/04/2020  . Tdap 08/27/2020     HPI    Victoria Deleon  is a patient with the above medical history. she was diagnosed with hypothyroidism at approximate age of 33 years, which required subsequent initiation of Levothyroxine therapy. she was given various doses of thyroid hormone replacement over the years.  she reports compliance to this medication:  Taking it daily on empty stomach with water, separated by >30 minutes before breakfast and other medications, and by at least 4 hours from calcium, iron, PPIs, multivitamins.  I reviewed patient's thyroid tests:  Lab Results  Component Value Date   TSH 12.500 (H) 09/09/2020   TSH 34.600 (H) 07/31/2020   TSH 33.600 (H) 05/21/2020   TSH 31.100 (H) 04/14/2020   TSH 32.800 (H) 03/10/2020   TSH 20.000 (H) 01/29/2020   TSH 17.800 (H) 01/25/2020   FREET4 1.05 09/09/2020   FREET4 0.70 (L) 07/31/2020    FREET4 0.72 (L) 05/21/2020   FREET4 0.77 (L) 01/29/2020     Pt describes: - weight gain - profound fatigue - depression/ anxiety - constipation - headaches  Pt denies feeling nodules in neck, hoarseness, dysphagia/odynophagia, SOB with lying down.  she does not know whether she has a family history of thyroid disorders because she was adopted. 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 She denies recent use of iodine supplements or Biotin containing supplements and stopped eating soy products as much (she is vegan).   Her TPO and thyroglobulin antibodies were positive indicating autoimmune Hashimoto's thyroiditis as the cause for her hypothyroidism.  Her baseline thyroid ultrasound showed heterogeneous thyroid tissue without suspicious nodules further suggesting autoimmune thyroid disease.   I reviewed her chart and she also has a history of crohn's disease.   ROS:  Constitutional: stable weight, + profound fatigue (improving some- has good and bad days), no subjective hyperthermia, no subjective hypothermia Eyes: no blurry vision, no xerophthalmia ENT: no sore throat, no nodules palpated in throat, no dysphagia/odynophagia, no hoarseness Cardiovascular: no Chest Pain, no Shortness of Breath, no palpitations, no leg swelling Respiratory: no cough, no SOB Gastrointestinal: no nausea/Vomiting, + intermittent constipation and diarrhea (has history of Crohn's disease and has good days and bad days)- recent GI study showed bowel blockage Musculoskeletal: no muscle/joint aches Skin: no rashes Neurological: no tremors, no numbness, no tingling, no dizziness Psychiatric: + depression / anxiety (hx of PTSD)    OBJECTIVE:  There were no vitals taken for this visit. Wt Readings from Last 3 Encounters:  08/27/20 167 lb 1.9 oz (75.8 kg)  08/01/20 168 lb 12.8 oz (76.6  kg)  07/14/20 171 lb (77.6 kg)   BP Readings from Last 3 Encounters:  08/27/20 130/78  08/01/20 118/69  07/14/20 128/77      Physical Exam- Telehealth- significantly limited due to nature of visit  Constitutional: There is no height or weight on file to calculate BMI. , not in acute distress, normal state of mind Respiratory: Adequate breathing efforts   CMP ( most recent) CMP     Component Value Date/Time   NA 140 07/07/2020 1112   K 4.2 07/07/2020 1112   CL 103 07/07/2020 1112   CO2 23 07/07/2020 1112   GLUCOSE 100 (H) 07/07/2020 1112   GLUCOSE 114 (H) 08/30/2019 0926   BUN 12 07/07/2020 1112   CREATININE 0.97 07/07/2020 1112   CALCIUM 9.6 07/07/2020 1112   PROT 6.7 07/07/2020 1112   ALBUMIN 4.5 07/07/2020 1112   AST 16 07/07/2020 1112   ALT 18 07/07/2020 1112   ALKPHOS 67 07/07/2020 1112   BILITOT 0.3 07/07/2020 1112   GFRNONAA 66 07/07/2020 1112   GFRAA 76 07/07/2020 1112     Diabetic Labs (most recent): No results found for: HGBA1C   Lipid Panel ( most recent) Lipid Panel     Component Value Date/Time   CHOL 252 (H) 01/25/2020 1024   TRIG 141 01/25/2020 1024   HDL 55 01/25/2020 1024   CHOLHDL 4.6 (H) 01/25/2020 1024   LDLCALC 172 (H) 01/25/2020 1024   LABVLDL 25 01/25/2020 1024       Lab Results  Component Value Date   TSH 12.500 (H) 09/09/2020   TSH 34.600 (H) 07/31/2020   TSH 33.600 (H) 05/21/2020   TSH 31.100 (H) 04/14/2020   TSH 32.800 (H) 03/10/2020   TSH 20.000 (H) 01/29/2020   TSH 17.800 (H) 01/25/2020   FREET4 1.05 09/09/2020   FREET4 0.70 (L) 07/31/2020   FREET4 0.72 (L) 05/21/2020   FREET4 0.77 (L) 01/29/2020       ASSESSMENT / PLAN:  1. Hypothyroidism due to Hashimoto's thyroiditis:  -Her previsit thyroid function tests are still consistent with under-replacement but have improved.  She is advised to increase her Tirosint to 200 mcg po daily before breakfast.  She is having trouble getting Tirosint approved by her  insurance company.  May need to change back to Levothyroxine if needed but we since we are seeing improvement on the current medication, I would prefer to stick with it, if possible.  -It appears she does have a medication absorption issue which may be related to her chronic PPI use and Crohn's disease contributing to her need for higher doses of thyroid hormone.  - We discussed about correct intake of levothyroxine, at fasting, with water, separated by at least 30 minutes from breakfast, and separated by more than 4 hours from calcium, iron, multivitamins, acid reflux medications (PPIs). -Patient is made aware of the fact that thyroid hormone replacement is needed for life, dose to be adjusted by periodic monitoring of thyroid function tests.        I spent 20 minutes dedicated to the care of this patient on the date of this encounter to include pre-visit review of records, face-to-face time with the patient, and post visit ordering of  testing. Arlyce Dice  participated in the discussions, expressed understanding, and voiced agreement with the above plans.  All questions were answered to her satisfaction. she is encouraged to contact clinic should she have any questions or concerns prior to her return visit.    FOLLOW UP  PLAN:  Return in about 2 months (around 11/15/2020) for Thyroid follow up, Previsit labs, Virtual visit ok.  Rayetta Pigg, [facility] [address] [place], [place] [address] Phone: [phone] Fax: [phone]  09/15/2020, 8:35 AM

## 2020-09-17 DIAGNOSIS — M47816 Spondylosis without myelopathy or radiculopathy, lumbar region: Secondary | ICD-10-CM | POA: Diagnosis not present

## 2020-09-25 ENCOUNTER — Telehealth: Payer: Self-pay

## 2020-09-25 NOTE — Telephone Encounter (Signed)
Victoria Deleon enter referral if he saw her last month or Victoria this need to be an appt?

## 2020-09-25 NOTE — Telephone Encounter (Signed)
They can do phone appt to discuss what neuro advised

## 2020-09-25 NOTE — Telephone Encounter (Signed)
This was the Ortho referring her to [facility]

## 2020-09-25 NOTE — Telephone Encounter (Signed)
Pt is calling to advise that she needs a referral to Pain Management per the [facility].  Dr Merlene Laughter office

## 2020-09-25 NOTE — Telephone Encounter (Signed)
See below message

## 2020-09-26 NOTE — Telephone Encounter (Signed)
Needs appt before referral can be placed

## 2020-09-29 ENCOUNTER — Ambulatory Visit (INDEPENDENT_AMBULATORY_CARE_PROVIDER_SITE_OTHER): Payer: Medicare HMO | Admitting: Internal Medicine

## 2020-09-29 ENCOUNTER — Encounter: Payer: Self-pay | Admitting: Internal Medicine

## 2020-09-29 ENCOUNTER — Other Ambulatory Visit: Payer: Self-pay

## 2020-09-29 DIAGNOSIS — M47816 Spondylosis without myelopathy or radiculopathy, lumbar region: Secondary | ICD-10-CM | POA: Diagnosis not present

## 2020-09-29 DIAGNOSIS — G894 Chronic pain syndrome: Secondary | ICD-10-CM

## 2020-09-29 NOTE — Progress Notes (Addendum)
Virtual Visit via Telephone Note   This visit type was conducted due to national recommendations for restrictions regarding the COVID-19 Pandemic (e.g. social distancing) in an effort to limit this patient's exposure and mitigate transmission in our community.  Due to her co-morbid illnesses, this patient is at least at moderate risk for complications without adequate follow up.  This format is felt to be most appropriate for this patient at this time.  The patient did not have access to video technology/had technical difficulties with video requiring transitioning to audio format only (telephone).  All issues noted in this document were discussed and addressed.  No physical exam could be performed with this format.  Evaluation Performed:  Follow-up visit  Date:  09/29/2020   ID:  Victoria Deleon, DOB September 06, 1965, MRN [id number]  Patient Location: Home Provider Location: Office/Clinic  Participants: Patient Location of Patient: Home Location of Provider: Telehealth Consent was obtain for visit to be over via telehealth. I verified that I am speaking with the correct person using two identifiers.  PCP:  Perlie Mayo, NP   Chief Complaint:  Back pain  History of Present Illness:    Victoria Deleon is a 55 y.o. female who has a televisit for c/o chronic back pain, which is constant, worse with movement. She visited her Spine specialist and had been receiving injections in the back as well as nerve blocks. She was advised to discuss about pain management with PCP by her Spine specialist. She has had discussion about surgical intervention as well, but she states that she would not benefit much from surgery according to Spine specialist. She prefers to be referred to local pain clinic in [place] than going to [place].  The patient does not have symptoms concerning for COVID-19 infection (fever, chills, cough, or new shortness of breath).   Past Medical, Surgical, Social  History, Allergies, and Medications have been Reviewed.  Past Medical History:  Diagnosis Date  . Anxiety   . Arthritis   . Crohn disease ([place])   . Depression   . Depression    Phreesia 03/04/2020  . History of kidney stones   . Hyperlipidemia    Phreesia 03/04/2020  . Hypertension   . Hypothyroidism   . Macular degeneration   . Nonspecific abnormal electrocardiogram (ECG) (EKG) 07/27/2018  . Pain due to unicompartmental arthroplasty of knee ([place]) 09/06/2019  . PTSD (post-traumatic stress disorder)   . 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 Thyroid disease    Phreesia 03/04/2020   Past Surgical History:  Procedure Laterality Date  . ABDOMINAL HYSTERECTOMY    . APPENDECTOMY    . BIOPSY  04/24/2018   Procedure: BIOPSY;  Surgeon: Daneil Dolin, MD;  [facility];  Service: Endoscopy;;  (gastric/duodenal)  . CHOLECYSTECTOMY    . COLONOSCOPY WITH PROPOFOL N/A 04/24/2018   Dr. Gala Romney: Normal terminal ileum with negative biopsies, normal-appearing colon with random colon biopsies negative, internal hemorrhoids.  Next colonoscopy 10 years.  . ESOPHAGOGASTRODUODENOSCOPY (EGD) WITH PROPOFOL N/A 04/24/2018   Dr. Gala Romney: Severe ulcerative reflux esophagitis, small hiatal hernia, small bowel biopsies negative for celiac.  . EYE SURGERY N/A    Phreesia 03/04/2020  . JOINT REPLACEMENT N/A    Phreesia 01/22/2020  . MEDIAL PARTIAL KNEE REPLACEMENT Left   . TOTAL KNEE REVISION Left 09/10/2019   Procedure: LEFT TOTAL KNEE REVISION;  Surgeon: Frederik Pear, MD;  Location: [facility];  Service: Orthopedics;  Laterality: Left;     Current Meds  Medication Sig  . atorvastatin (LIPITOR) 80 MG tablet Take 1 tablet (80 mg total) by mouth at bedtime.  . bisacodyl (DULCOLAX) 10 MG suppository Place 1 suppository (10 mg total) rectally as needed for moderate constipation.  . busPIRone (BUSPAR) 7.5 MG tablet Take 1 tablet (7.5 mg total) by mouth 3 (three) times daily.   Marland Kitchen doxepin (SINEQUAN) 25 MG capsule TAKE 1 CAPSULE BY MOUTHHONCE AT BEDTIME.  Marland Kitchen EPINEPHrine 0.3 mg/0.3 mL IJ SOAJ injection Inject 0.3 mg into the muscle as needed for anaphylaxis.  Marland Kitchen FIBER PO Take 1 capsule by mouth 2 (two) times daily.   Marland Kitchen gabapentin (NEURONTIN) 300 MG capsule TAKE 1 CAPSULE BY MOUTHCTHREE TIMES A DAY.  Marland Kitchen levothyroxine (EUTHYROX) 200 MCG tablet Take 1 tablet (200 mcg total) by mouth daily before breakfast.  . LINZESS 72 MCG capsule TAKE 1 CAPSULE BY MOUTH ONCE DAILY.  . methocarbamol (ROBAXIN) 500 MG tablet Take 500 mg by mouth 2 (two) times daily.  . metoprolol succinate (TOPROL-XL) 25 MG 24 hr tablet Take 1 tablet (25 mg total) by mouth daily.  . Multiple Vitamin (MULTIVITAMIN WITH MINERALS) TABS tablet Take 1 tablet by mouth daily.  . pantoprazole (PROTONIX) 40 MG tablet TAKE 1 TABLET BY MOUTH ONCE DAILY.  Marland Kitchen sertraline (ZOLOFT) 100 MG tablet Take 1.5 tablets (150 mg total) by mouth daily.  Marland Kitchen tiZANidine (ZANAFLEX) 2 MG tablet Take 1 tablet (2 mg total) by mouth every 6 (six) hours as needed.     Allergies:   Codeine   ROS:   Please see the history of present illness.     All other systems reviewed and are negative.   Labs/Other Tests and Data Reviewed:    Recent Labs: 07/07/2020: ALT 18; BUN 12; Creatinine, Ser 0.97; Hemoglobin 14.9; Platelets 317; Potassium 4.2; Sodium 140 09/09/2020: TSH 12.500   Recent Lipid Panel Lab Results  Component Value Date/Time   CHOL 252 (H) 01/25/2020 10:24 AM   TRIG 141 01/25/2020 10:24 AM   HDL 55 01/25/2020 10:24 AM   CHOLHDL 4.6 (H) 01/25/2020 10:24 AM   LDLCALC 172 (H) 01/25/2020 10:24 AM    Wt Readings from Last 3 Encounters:  08/27/20 167 lb 1.9 oz (75.8 kg)  08/01/20 168 lb 12.8 oz (76.6 kg)  07/14/20 171 lb (77.6 kg)      ASSESSMENT & PLAN:    Lumbar spondylosis Has had Spine surgery evaluation Has received intraarticular steroid and nerve blocks in the past On Gabapentin 300 mg TID Zanaflex PRN Will refer  to Pain clinic  Chronic pain syndrome Due to lumbar spondylosis Referred to pain clinic  Time:   Today, I have spent 7 minutes reviewing the chart, including problem list, medications, and with the patient with telehealth technology discussing the above problems.   Medication Adjustments/Labs and Tests Ordered: Current medicines are reviewed at length with the patient today.  Concerns regarding medicines are outlined above.   Tests Ordered: Orders Placed This Encounter  Procedures  . Ambulatory referral to Pain Clinic    Medication Changes: No orders of the defined types were placed in this encounter.    Note: This dictation was prepared with Dragon dictation along with smaller phrase technology. Similar sounding words can be transcribed inadequately or may not be corrected upon review. Any transcriptional errors that result from this process are unintentional.      Disposition:  Follow up  Signed, Lindell Spar, MD  09/29/2020 10:34 AM  [facility]

## 2020-09-29 NOTE — Patient Instructions (Signed)
You are being referred to [facility] for pain management.  Please continue to take medications as prescribed for now.

## 2020-09-29 NOTE — Assessment & Plan Note (Signed)
Has had Spine surgery evaluation Has received intraarticular steroid and nerve blocks in the past On Gabapentin 300 mg TID Zanaflex PRN Will refer to Pain clinic

## 2020-09-29 NOTE — Telephone Encounter (Signed)
LVM to call and schedule an appointment

## 2020-10-15 ENCOUNTER — Other Ambulatory Visit: Payer: Self-pay

## 2020-10-15 ENCOUNTER — Ambulatory Visit (INDEPENDENT_AMBULATORY_CARE_PROVIDER_SITE_OTHER): Payer: Medicare HMO

## 2020-10-15 DIAGNOSIS — Z0000 Encounter for general adult medical examination without abnormal findings: Secondary | ICD-10-CM

## 2020-10-15 NOTE — Progress Notes (Signed)
Subjective:   Victoria Deleon is a 55 y.o. female who presents for an Initial Medicare Annual Wellness Visit.  I connected with Jazae Gandolfi today by telephone and verified that I am speaking with the correct person using two identifiers. Location patient: home Location provider: work Persons participating in the virtual visit: patient, provider.   I discussed the limitations, risks, security and privacy concerns of performing an evaluation and management service by telephone and the availability of in person appointments. I also discussed with the patient that there may be a patient responsible charge related to this service. The patient expressed understanding and verbally consented to this telephonic visit.    Interactive audio and video telecommunications were attempted between this provider and patient, however failed, due to patient having technical difficulties OR patient did not have access to video capability.  We continued and completed visit with audio only.      Review of Systems    N/A        Objective:    Today's Vitals   10/15/20 0906  PainSc: 7    There is no height or weight on file to calculate BMI.  Advanced Directives 10/15/2020 09/26/2019 09/10/2019 08/30/2019 04/24/2018 04/18/2018  Does Patient Have a Medical Advance Directive? No No No No No No  Would patient like information on creating a medical advance directive? No - Patient declined No - Patient declined No - Patient declined - No - Patient declined No - Patient declined    Current Medications (verified) Outpatient Encounter Medications as of 10/15/2020  Medication Sig  . atorvastatin (LIPITOR) 80 MG tablet Take 1 tablet (80 mg total) by mouth at bedtime.  . bisacodyl (DULCOLAX) 10 MG suppository Place 1 suppository (10 mg total) rectally as needed for moderate constipation.  . busPIRone (BUSPAR) 7.5 MG tablet Take 1 tablet (7.5 mg total) by mouth 3 (three) times daily.  Marland Kitchen doxepin (SINEQUAN) 25 MG  capsule TAKE 1 CAPSULE BY MOUTHHONCE AT BEDTIME.  Marland Kitchen FIBER PO Take 1 capsule by mouth 2 (two) times daily.   Marland Kitchen gabapentin (NEURONTIN) 300 MG capsule TAKE 1 CAPSULE BY MOUTHCTHREE TIMES A DAY.  Marland Kitchen levothyroxine (EUTHYROX) 200 MCG tablet Take 1 tablet (200 mcg total) by mouth daily before breakfast.  . LINZESS 72 MCG capsule TAKE 1 CAPSULE BY MOUTH ONCE DAILY.  . methocarbamol (ROBAXIN) 500 MG tablet Take 500 mg by mouth 2 (two) times daily.  . metoprolol succinate (TOPROL-XL) 25 MG 24 hr tablet Take 1 tablet (25 mg total) by mouth daily.  . Multiple Vitamin (MULTIVITAMIN WITH MINERALS) TABS tablet Take 1 tablet by mouth daily.  . pantoprazole (PROTONIX) 40 MG tablet TAKE 1 TABLET BY MOUTH ONCE DAILY.  Marland Kitchen sertraline (ZOLOFT) 100 MG tablet Take 1.5 tablets (150 mg total) by mouth daily.  Marland Kitchen tiZANidine (ZANAFLEX) 2 MG tablet Take 1 tablet (2 mg total) by mouth every 6 (six) hours as needed.  Marland Kitchen EPINEPHrine 0.3 mg/0.3 mL IJ SOAJ injection Inject 0.3 mg into the muscle as needed for anaphylaxis. (Patient not taking: Reported on 10/15/2020)   No facility-administered encounter medications on file as of 10/15/2020.    Allergies (verified) Codeine   History: Past Medical History:  Diagnosis Date  . Anxiety   . Arthritis   . Crohn disease ([place])   . Depression   . Depression    Phreesia 03/04/2020  . History of kidney stones   . Hyperlipidemia    [facility]reesia 03/04/2020  . Hypertension   . Hypothyroidism   .  Macular degeneration   . Nonspecific abnormal electrocardiogram (ECG) (EKG) 07/27/2018  . Pain due to unicompartmental arthroplasty of knee ([place]) 09/06/2019  . PTSD (post-traumatic stress disorder)   . 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 Thyroid disease    [facility]reesia 03/04/2020   Past Surgical History:  Procedure Laterality Date  . ABDOMINAL HYSTERECTOMY    . APPENDECTOMY    . BIOPSY  04/24/2018   Procedure: BIOPSY;  Surgeon: Daneil Dolin, MD;  Location: [facility];  Service: Endoscopy;;  (gastric/duodenal)  . CHOLECYSTECTOMY    . COLONOSCOPY WITH PROPOFOL N/A 04/24/2018   Dr. Gala Romney: Normal terminal ileum with negative biopsies, normal-appearing colon with random colon biopsies negative, internal hemorrhoids.  Next colonoscopy 10 years.  . ESOPHAGOGASTRODUODENOSCOPY (EGD) WITH PROPOFOL N/A 04/24/2018   Dr. Gala Romney: Severe ulcerative reflux esophagitis, small hiatal hernia, small bowel biopsies negative for celiac.  . EYE SURGERY N/A    Phreesia 03/04/2020  . JOINT REPLACEMENT N/A    Phreesia 01/22/2020  . MEDIAL PARTIAL KNEE REPLACEMENT Left   . TOTAL KNEE REVISION Left 09/10/2019   Procedure: LEFT TOTAL KNEE REVISION;  Surgeon: Frederik Pear, MD;  Location: [facility];  Service: Orthopedics;  Laterality: Left;   Family History  Adopted: Yes  Problem Relation Age of Onset  . Colon cancer Neg Hx    Social History   Socioeconomic History  . Marital status: Married    Spouse name: Fritz Pickerel   . Number of children: Not on file  . Years of education: Not on file  . Highest education level: Not on file  Occupational History    Comment: medical disability   Tobacco Use  . Smoking status: Current Every Day Smoker    Packs/day: 0.25    Years: 30.00    Pack years: 7.50    Types: Cigarettes  . Smokeless tobacco: Never Used  Vaping Use  . Vaping Use: Never used  Substance and Sexual Activity  . Alcohol use: Yes    Comment: rare-maybe 2 glasses of wine/year  . Drug use: Never  . Sexual activity: Not Currently    Birth control/protection: Surgical    Comment: hyst  Other Topics Concern  . Not on file  Social History Narrative   Lives with husband Fritz Pickerel          Enjoys: kayaking, outside events       Diet: eats all food groups outside meat    Caffeine: tea 2 cups daily    Water: 5-6 cups daily or more      Wears seat belt   Does not use phone while driving    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, fast food safe area   Social  Determinants of Health   Financial Resource Strain: High Risk  . Difficulty of Paying Living Expenses: Hard  Food Insecurity: Food Insecurity Present  . Worried About Charity fundraiser in the Last Year: Sometimes true  . Ran Out of Food in the Last Year: Sometimes true  Transportation Needs: No Transportation Needs  . Lack of Transportation (Medical): No  . Lack of Transportation (Non-Medical): No  Physical Activity: Inactive  . Days of Exercise per Week: 0 days  . Minutes of Exercise per Session: 0 min  Stress: Stress Concern Present  . Feeling of Stress : To some extent  Social Connections: Moderately Isolated  . Frequency of Communication with Friends and Family: Once a week  . Frequency  of Social Gatherings with Friends and Family: More than three times a week  . Attends Religious Services: Never  . Active Member of Clubs or Organizations: No  . Attends Archivist Meetings: Never  . Marital Status: Married    Tobacco Counseling Ready to quit: Not Answered Counseling given: Not Answered   Clinical Intake:  Pre-visit preparation completed: Yes  Pain : 0-10 Pain Score: 7  Pain Type: Chronic pain Pain Location: Back Pain Orientation: Lower Pain Descriptors / Indicators: Constant Pain Onset: More than a month ago Pain Frequency: Constant     Nutritional Risks: Nausea/ vomitting/ diarrhea (Diarrhea) Diabetes: No  How often do you need to have someone help you when you read instructions, pamphlets, or other written materials from your doctor or pharmacy?: 1 - Never  Diabetic?No  Interpreter Needed?: No  Information entered by :: [place] of Daily Living In your present state of health, do you have any difficulty performing the following activities: 10/15/2020  Hearing? N  Vision? N  Difficulty concentrating or making decisions? Y  Comment has some issues concentrating  Walking or climbing stairs? Y  Dressing or bathing? N  Doing  errands, shopping? Y  Comment can drive but has anxiety so does not drive  Preparing Food and eating ? N  Using the Toilet? N  In the past six months, have you accidently leaked urine? N  Do you have problems with loss of bowel control? N  Managing your Medications? N  Managing your Finances? N  Housekeeping or managing your Housekeeping? N  Some recent data might be hidden    Patient Care Team: Perlie Mayo, NP as PCP - General (Family Medicine) Satira Sark, MD as PCP - Cardiology (Cardiology) Gala Romney Cristopher Estimable, MD as Consulting Physician (Gastroenterology)  Indicate any recent Medical Services you may have received from other than [facility] providers in the past year (date may be approximate).     Assessment:   This is a routine wellness examination for Miryah.  Hearing/Vision screen  Hearing Screening   125Hz  250Hz  500Hz  1000Hz  2000Hz  3000Hz  4000Hz  6000Hz  8000Hz   Right ear:           Left ear:           Vision Screening Comments: Patient gets eyes examined every 3 months due to macular degeneration. Currently wears glasses  Dietary issues and exercise activities discussed: Current Exercise Habits: The patient does not participate in regular exercise at present, Exercise limited by: None identified  Goals Addressed            This Visit's Progress   . Patient Stated       I would like to maintain my current state health.      Depression Screen PHQ 2/9 Scores 10/15/2020 09/29/2020 08/27/2020 07/14/2020 07/07/2020 05/14/2020 04/15/2020  PHQ - 2 Score 0 0 0 1 2 2 3   PHQ- 9 Score - - - 9 6 14 12     Fall Risk Fall Risk  10/15/2020 09/29/2020 08/27/2020 07/14/2020 07/07/2020  Falls in the past year? 0 0 0 0 0  Number falls in past yr: 0 0 0 0 0  Injury with Fall? 0 0 0 0 0  Risk for fall due to : No Fall Risks No Fall Risks No Fall Risks No Fall Risks No Fall Risks  Follow up Falls evaluation completed;Falls prevention discussed Falls evaluation completed Falls evaluation  completed Falls evaluation completed Falls evaluation completed    FALL  RISK PREVENTION PERTAINING TO THE HOME:  Any stairs in or around the home? Yes  If so, are there any without handrails? No  Home free of loose throw rugs in walkways, pet beds, electrical cords, etc? Yes  Adequate lighting in your home to reduce risk of falls? Yes   ASSISTIVE DEVICES UTILIZED TO PREVENT FALLS:  Life alert? No  Use of a cane, walker or w/c? No  Grab bars in the bathroom? Yes  Shower chair or bench in shower? No  Elevated toilet seat or a handicapped toilet? No    Cognitive Function:     Normal cognitive status assessed by direct observation by this Nurse Health Advisor. No abnormalities found.      Immunizations Immunization History  Administered Date(s) Administered  . Moderna Sars-Covid-2 Vaccination 11/15/2019, 01/04/2020  . Tdap 08/27/2020    TDAP status: Up to date  Flu Vaccine status: Declined, Education has been provided regarding the importance of this vaccine but patient still declined. Advised may receive this vaccine at local pharmacy or Health Dept. Aware to provide a copy of the vaccination record if obtained from local pharmacy or Health Dept. Verbalized acceptance and understanding.  Pneumococcal vaccine status: Up to date  Covid-19 vaccine status: Completed vaccines  Qualifies for Shingles Vaccine? Yes   Zostavax completed No   Shingrix Completed?: No.    Education has been provided regarding the importance of this vaccine. Patient has been advised to call insurance company to determine out of pocket expense if they have not yet received this vaccine. Advised may also receive vaccine at local pharmacy or Health Dept. Verbalized acceptance and understanding.  Screening Tests Health Maintenance  Topic Date Due  . HIV Screening  Never done  . Hepatitis C Screening  Never done  . INFLUENZA VACCINE  12/29/2020  . MAMMOGRAM  01/31/2022  . COLONOSCOPY (Pts 45-22yr  Insurance coverage will need to be confirmed)  04/24/2028  . TETANUS/TDAP  08/28/2030  . HPV VACCINES  Aged Out  . PAP SMEAR-Modifier  Discontinued  . COVID-19 Vaccine  Discontinued    Health Maintenance  Health Maintenance Due  Topic Date Due  . HIV Screening  Never done  . Hepatitis C Screening  Never done    Colorectal cancer screening: Type of screening: Colonoscopy. Completed 04/24/2018. Repeat every 10 years  Mammogram status: Completed 02/01/2020. Repeat every year  Bone Density Status: not due until age 56 Lung Cancer Screening: (Low Dose CT Chest recommended if Age 433-80years, 30 pack-year currently smoking OR have quit w/in 15years.) does not qualify.   Lung Cancer Screening Referral: N/A   Additional Screening:  Hepatitis C Screening: does qualify;   Vision Screening: Recommended annual ophthalmology exams for early detection of glaucoma and other disorders of the eye. Is the patient up to date with their annual eye exam?  Yes  Who is the provider or what is the name of the office in which the patient attends annual eye exams? [facility] in [place] If pt is not established with a provider, would they like to be referred to a provider to establish care? No .   Dental Screening: Recommended annual dental exams for proper oral hygiene  Community Resource Referral / Chronic Care Management: CRR required this visit?  No   CCM required this visit?  No      Plan:     I have personally reviewed and noted the following in the patient's chart:   . Medical and social history .  Use of alcohol, tobacco or illicit drugs  . Current medications and supplements including opioid prescriptions. Patient is not currently taking opioid prescriptions. . Functional ability and status . Nutritional status . Physical activity . Advanced directives . List of other physicians . Hospitalizations, surgeries, and ER visits in previous 12 months . Vitals . Screenings to include  cognitive, depression, and falls . Referrals and appointments  In addition, I have reviewed and discussed with patient certain preventive protocols, quality metrics, and best practice recommendations. A written personalized care plan for preventive services as well as general preventive health recommendations were provided to patient.     Ofilia Neas, LPN   09/14/5299   Nurse Notes: None

## 2020-10-15 NOTE — Patient Instructions (Signed)
Victoria Deleon , Thank you for taking time to come for your Medicare Wellness Visit. I appreciate your ongoing commitment to your health goals. Please review the following plan we discussed and let me know if I can assist you in the future.   Screening recommendations/referrals: Colonoscopy: Up to date, next due 04/24/2028  Mammogram: Up to date, next due 01/31/2021 Bone Density: Not due until age 56 Recommended yearly ophthalmology/optometry visit for glaucoma screening and checkup Recommended yearly dental visit for hygiene and checkup  Vaccinations: Influenza vaccine: Patient declined  Pneumococcal vaccine: Not due until age 53 Tdap vaccine: Up to date, next due 08/27/2020 Shingles vaccine: Currently due for shingrix, if you would like to receive we recommend that you do so at your local pharmacy    Advanced directives: Advance directive discussed with you today. Even though you declined this today please call our office should you change your mind and we can give you the proper paperwork for you to fill out.   Conditions/risks identified: None   Next appointment: 10/21/2021 @ 9:00 am  Preventive Care 40-64 Years, Female Preventive care refers to lifestyle choices and visits with your health care provider that can promote health and wellness. What does preventive care include?  A yearly physical exam. This is also called an annual well check.  Dental exams once or twice a year.  Routine eye exams. Ask your health care provider how often you should have your eyes checked.  Personal lifestyle choices, including:  Daily care of your teeth and gums.  Regular physical activity.  Eating a healthy diet.  Avoiding tobacco and drug use.  Limiting alcohol use.  Practicing safe sex.  Taking low-dose aspirin daily starting at age 79.  Taking vitamin and mineral supplements as recommended by your health care provider. What happens during an annual well check? The services and  screenings done by your health care provider during your annual well check will depend on your age, overall health, lifestyle risk factors, and family history of disease. Counseling  Your health care provider may ask you questions about your:  Alcohol use.  Tobacco use.  Drug use.  Emotional well-being.  Home and relationship well-being.  Sexual activity.  Eating habits.  Work and work Statistician.  Method of birth control.  Menstrual cycle.  Pregnancy history. Screening  You may have the following tests or measurements:  Height, weight, and BMI.  Blood pressure.  Lipid and cholesterol levels. These may be checked every 5 years, or more frequently if you are over 48 years old.  Skin check.  Lung cancer screening. You may have this screening every year starting at age 35 if you have a 30-pack-year history of smoking and currently smoke or have quit within the past 15 years.  Fecal occult blood test (FOBT) of the stool. You may have this test every year starting at age 44.  Flexible sigmoidoscopy or colonoscopy. You may have a sigmoidoscopy every 5 years or a colonoscopy every 10 years starting at age 81.  Hepatitis C blood test.  Hepatitis B blood test.  Sexually transmitted disease (STD) testing.  Diabetes screening. This is done by checking your blood sugar (glucose) after you have not eaten for a while (fasting). You may have this done every 1-3 years.  Mammogram. This may be done every 1-2 years. Talk to your health care provider about when you should start having regular mammograms. This may depend on whether you have a family history of breast cancer.  BRCA-related  cancer screening. This may be done if you have a family history of breast, ovarian, tubal, or peritoneal cancers.  Pelvic exam and Pap test. This may be done every 3 years starting at age 21. Starting at age 30, this may be done every 5 years if you have a Pap test in combination with an HPV  test.  Bone density scan. This is done to screen for osteoporosis. You may have this scan if you are at high risk for osteoporosis. Discuss your test results, treatment options, and if necessary, the need for more tests with your health care provider. Vaccines  Your health care provider may recommend certain vaccines, such as:  Influenza vaccine. This is recommended every year.  Tetanus, diphtheria, and acellular pertussis (Tdap, Td) vaccine. You may need a Td booster every 10 years.  Zoster vaccine. You may need this after age 60.  Pneumococcal 13-valent conjugate (PCV13) vaccine. You may need this if you have certain conditions and were not previously vaccinated.  Pneumococcal polysaccharide (PPSV23) vaccine. You may need one or two doses if you smoke cigarettes or if you have certain conditions. Talk to your health care provider about which screenings and vaccines you need and how often you need them. This information is not intended to replace advice given to you by your health care provider. Make sure you discuss any questions you have with your health care provider. Document Released: 06/13/2015 Document Revised: 02/04/2016 Document Reviewed: 03/18/2015 Elsevier Interactive Patient Education  2017 [employer]    Fall Prevention in the Home Falls can cause injuries. They can happen to people of all ages. There are many things you can do to make your home safe and to help prevent falls. What can I do on the outside of my home?  Regularly fix the edges of walkways and driveways and fix any cracks.  Remove anything that might make you trip as you walk through a door, such as a raised step or threshold.  Trim any bushes or trees on the path to your home.  Use bright outdoor lighting.  Clear any walking paths of anything that might make someone trip, such as rocks or tools.  Regularly check to see if handrails are loose or broken. Make sure that both sides of any steps have  handrails.  Any raised decks and porches should have guardrails on the edges.  Have any leaves, snow, or ice cleared regularly.  Use sand or salt on walking paths during winter.  Clean up any spills in your garage right away. This includes oil or grease spills. What can I do in the bathroom?  Use night lights.  Install grab bars by the toilet and in the tub and shower. Do not use towel bars as grab bars.  Use non-skid mats or decals in the tub or shower.  If you need to sit down in the shower, use a plastic, non-slip stool.  Keep the floor dry. Clean up any water that spills on the floor as soon as it happens.  Remove soap buildup in the tub or shower regularly.  Attach bath mats securely with double-sided non-slip rug tape.  Do not have throw rugs and other things on the floor that can make you trip. What can I do in the bedroom?  Use night lights.  Make sure that you have a light by your bed that is easy to reach.  Do not use any sheets or blankets that are too big for your bed. They should   not hang down onto the floor.  Have a firm chair that has side arms. You can use this for support while you get dressed.  Do not have throw rugs and other things on the floor that can make you trip. What can I do in the kitchen?  Clean up any spills right away.  Avoid walking on wet floors.  Keep items that you use a lot in easy-to-reach places.  If you need to reach something above you, use a strong step stool that has a grab bar.  Keep electrical cords out of the way.  Do not use floor polish or wax that makes floors slippery. If you must use wax, use non-skid floor wax.  Do not have throw rugs and other things on the floor that can make you trip. What can I do with my stairs?  Do not leave any items on the stairs.  Make sure that there are handrails on both sides of the stairs and use them. Fix handrails that are broken or loose. Make sure that handrails are as long as  the stairways.  Check any carpeting to make sure that it is firmly attached to the stairs. Fix any carpet that is loose or worn.  Avoid having throw rugs at the top or bottom of the stairs. If you do have throw rugs, attach them to the floor with carpet tape.  Make sure that you have a light switch at the top of the stairs and the bottom of the stairs. If you do not have them, ask someone to add them for you. What else can I do to help prevent falls?  Wear shoes that:  Do not have high heels.  Have rubber bottoms.  Are comfortable and fit you well.  Are closed at the toe. Do not wear sandals.  If you use a stepladder:  Make sure that it is fully opened. Do not climb a closed stepladder.  Make sure that both sides of the stepladder are locked into place.  Ask someone to hold it for you, if possible.  Clearly mark and make sure that you can see:  Any grab bars or handrails.  First and last steps.  Where the edge of each step is.  Use tools that help you move around (mobility aids) if they are needed. These include:  Canes.  Walkers.  Scooters.  Crutches.  Turn on the lights when you go into a dark area. Replace any light bulbs as soon as they burn out.  Set up your furniture so you have a clear path. Avoid moving your furniture around.  If any of your floors are uneven, fix them.  If there are any pets around you, be aware of where they are.  Review your medicines with your doctor. Some medicines can make you feel dizzy. This can increase your chance of falling. Ask your doctor what other things that you can do to help prevent falls. This information is not intended to replace advice given to you by your health care provider. Make sure you discuss any questions you have with your health care provider. Document Released: 03/13/2009 Document Revised: 10/23/2015 Document Reviewed: 06/21/2014 Elsevier Interactive Patient Education  2017 [employer].

## 2020-10-16 DIAGNOSIS — F411 Generalized anxiety disorder: Secondary | ICD-10-CM | POA: Diagnosis not present

## 2020-10-20 ENCOUNTER — Telehealth: Payer: Self-pay

## 2020-10-20 NOTE — Telephone Encounter (Signed)
PT called wanting to know about Cholestorel draw

## 2020-10-20 NOTE — Telephone Encounter (Signed)
Left message

## 2020-10-21 ENCOUNTER — Other Ambulatory Visit: Payer: Self-pay | Admitting: Internal Medicine

## 2020-10-21 DIAGNOSIS — I10 Essential (primary) hypertension: Secondary | ICD-10-CM

## 2020-10-21 DIAGNOSIS — E038 Other specified hypothyroidism: Secondary | ICD-10-CM

## 2020-10-21 DIAGNOSIS — E782 Mixed hyperlipidemia: Secondary | ICD-10-CM

## 2020-10-21 NOTE — Telephone Encounter (Signed)
Left message

## 2020-10-21 NOTE — Telephone Encounter (Signed)
Fasting blood tests ordered. Please advise her to get them done in mid June and schedule Annual Physical in the following week for her. Thank you.

## 2020-10-21 NOTE — Telephone Encounter (Signed)
Pt has not had her cholesterol checked in awhile and she is requesting to have this done. Is this ok for her to get? She does not have any future appts at this time. Please advise.

## 2020-10-21 NOTE — Telephone Encounter (Signed)
Please call the pt back  To advise

## 2020-10-21 NOTE — Telephone Encounter (Signed)
Pt informed

## 2020-10-28 ENCOUNTER — Other Ambulatory Visit: Payer: Self-pay | Admitting: Family Medicine

## 2020-10-30 DIAGNOSIS — I10 Essential (primary) hypertension: Secondary | ICD-10-CM | POA: Diagnosis not present

## 2020-10-30 DIAGNOSIS — E063 Autoimmune thyroiditis: Secondary | ICD-10-CM | POA: Diagnosis not present

## 2020-10-30 DIAGNOSIS — E782 Mixed hyperlipidemia: Secondary | ICD-10-CM | POA: Diagnosis not present

## 2020-10-30 DIAGNOSIS — E038 Other specified hypothyroidism: Secondary | ICD-10-CM | POA: Diagnosis not present

## 2020-10-31 ENCOUNTER — Other Ambulatory Visit: Payer: Self-pay | Admitting: Internal Medicine

## 2020-10-31 LAB — LIPID PANEL
Chol/HDL Ratio: 6.2 ratio — ABNORMAL HIGH (ref 0.0–4.4)
Cholesterol, Total: 348 mg/dL — ABNORMAL HIGH (ref 100–199)
HDL: 56 mg/dL (ref >39)
LDL Chol Calc (NIH): 262 mg/dL — ABNORMAL HIGH (ref 0–99)
Triglycerides: 154 mg/dL — ABNORMAL HIGH (ref 0–149)
VLDL Cholesterol Cal: 30 mg/dL (ref 5–40)

## 2020-10-31 LAB — CMP14+EGFR
ALT: 16 IU/L (ref 0–32)
AST: 17 IU/L (ref 0–40)
Albumin/Globulin Ratio: 2.9 — ABNORMAL HIGH (ref 1.2–2.2)
Albumin: 5 g/dL — ABNORMAL HIGH (ref 3.8–4.9)
Alkaline Phosphatase: 73 IU/L (ref 44–121)
BUN/Creatinine Ratio: 8 — ABNORMAL LOW (ref 9–23)
BUN: 7 mg/dL (ref 6–24)
Bilirubin Total: 0.4 mg/dL (ref 0.0–1.2)
CO2: 22 mmol/L (ref 20–29)
Calcium: 9.7 mg/dL (ref 8.7–10.2)
Chloride: 99 mmol/L (ref 96–106)
Creatinine, Ser: 0.86 mg/dL (ref 0.57–1.00)
Globulin, Total: 1.7 g/dL (ref 1.5–4.5)
Glucose: 100 mg/dL — ABNORMAL HIGH (ref 65–99)
Potassium: 4.3 mmol/L (ref 3.5–5.2)
Sodium: 138 mmol/L (ref 134–144)
Total Protein: 6.7 g/dL (ref 6.0–8.5)
eGFR: 80 mL/min/{1.73_m2} (ref >59)

## 2020-10-31 LAB — CBC WITH DIFFERENTIAL/PLATELET
Basophils Absolute: 0.1 10*3/uL (ref 0.0–0.2)
Basos: 1 %
EOS (ABSOLUTE): 0.2 10*3/uL (ref 0.0–0.4)
Eos: 4 %
Hematocrit: 44.9 % (ref 34.0–46.6)
Hemoglobin: 15.3 g/dL (ref 11.1–15.9)
Immature Grans (Abs): 0 10*3/uL (ref 0.0–0.1)
Immature Granulocytes: 0 %
Lymphocytes Absolute: 1.4 10*3/uL (ref 0.7–3.1)
Lymphs: 24 %
MCH: 30.5 pg (ref 26.6–33.0)
MCHC: 34.1 g/dL (ref 31.5–35.7)
MCV: 90 fL (ref 79–97)
Monocytes Absolute: 0.6 10*3/uL (ref 0.1–0.9)
Monocytes: 10 %
Neutrophils Absolute: 3.5 10*3/uL (ref 1.4–7.0)
Neutrophils: 61 %
Platelets: 304 10*3/uL (ref 150–450)
RBC: 5.01 x10E6/uL (ref 3.77–5.28)
RDW: 13.5 % (ref 11.7–15.4)
WBC: 5.9 10*3/uL (ref 3.4–10.8)

## 2020-10-31 LAB — TSH+FREE T4
Free T4: 0.84 ng/dL (ref 0.82–1.77)
TSH: 23.8 u[IU]/mL — ABNORMAL HIGH (ref 0.450–4.500)

## 2020-11-03 IMAGING — MG DIGITAL SCREENING BILAT W/ TOMO W/ CAD
8 series · 8 of 24 positions shown · non-contrast
Comparison: None.

CLINICAL DATA: Screening.

EXAM:
DIGITAL SCREENING BILATERAL MAMMOGRAM WITH TOMO AND CAD

[R MLO synth-2D]
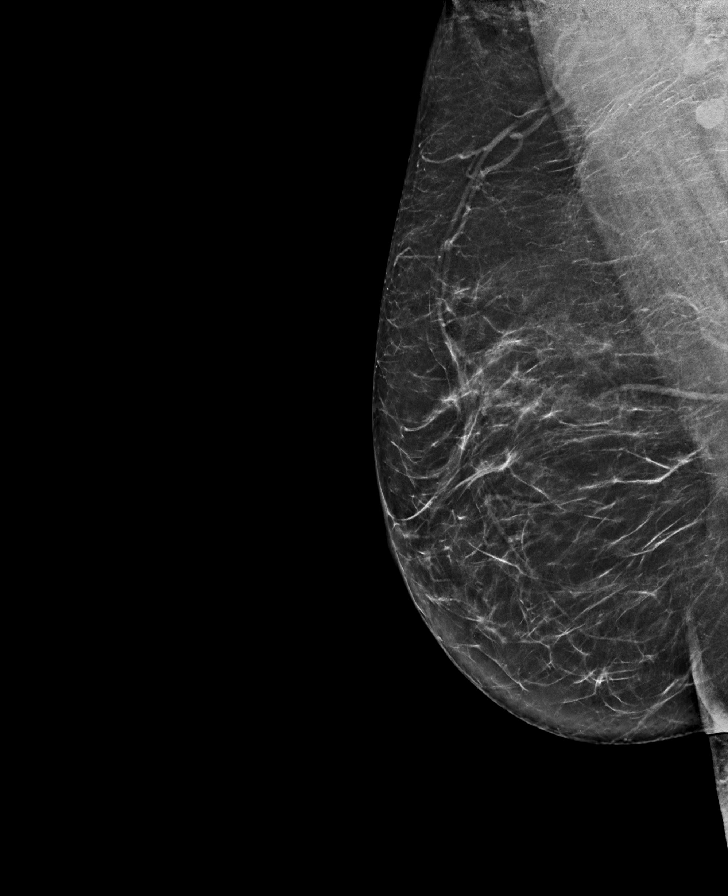

[L MLO synth-2D]
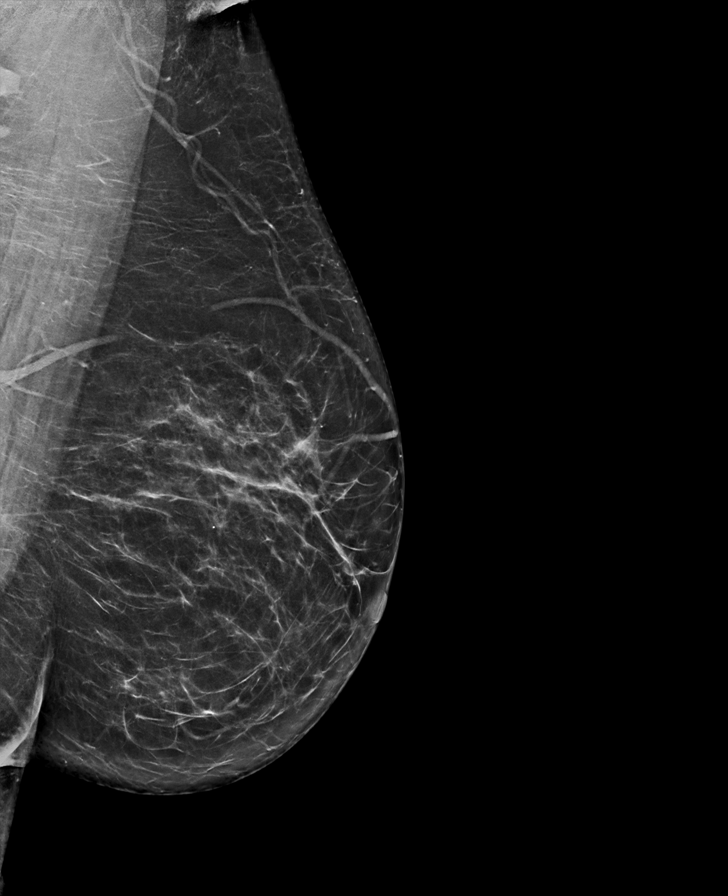

[R CC synth-2D]
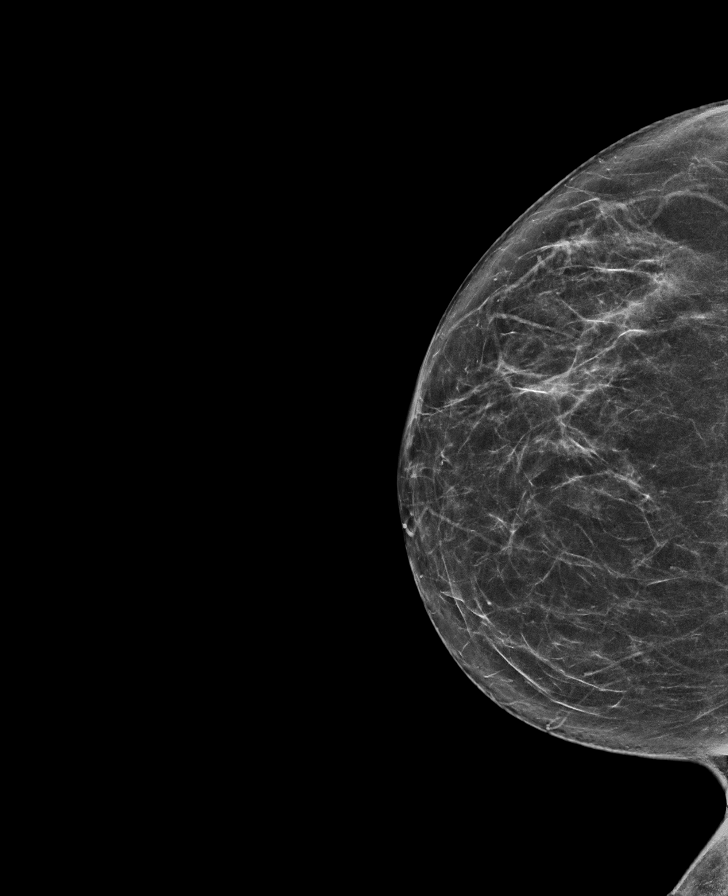

[L CC synth-2D]
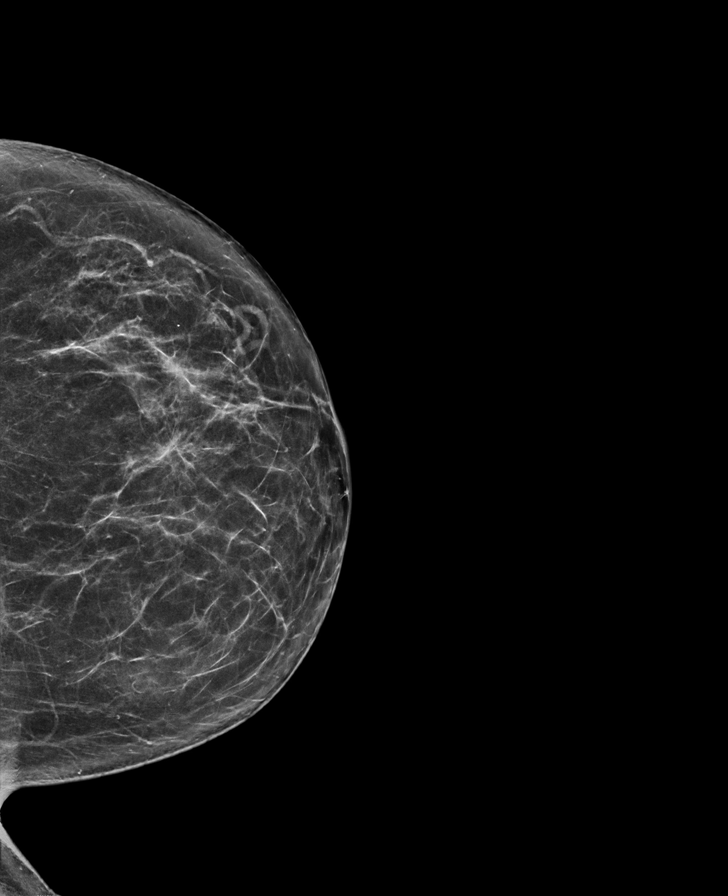

[L MLO tomo · tomo slice 39/76.0]
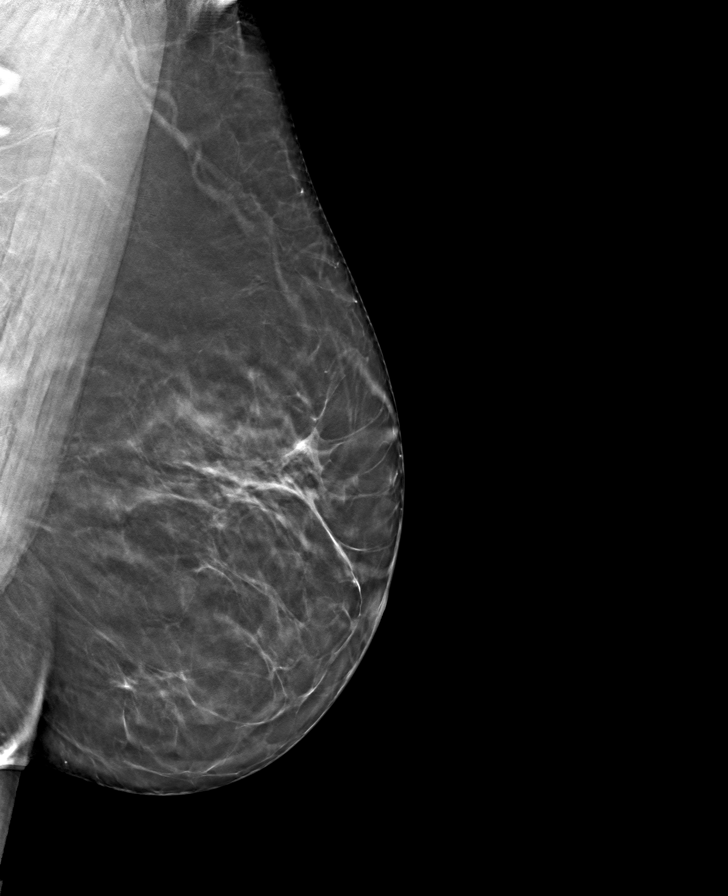

[R CC tomo · tomo slice 36/71.0]
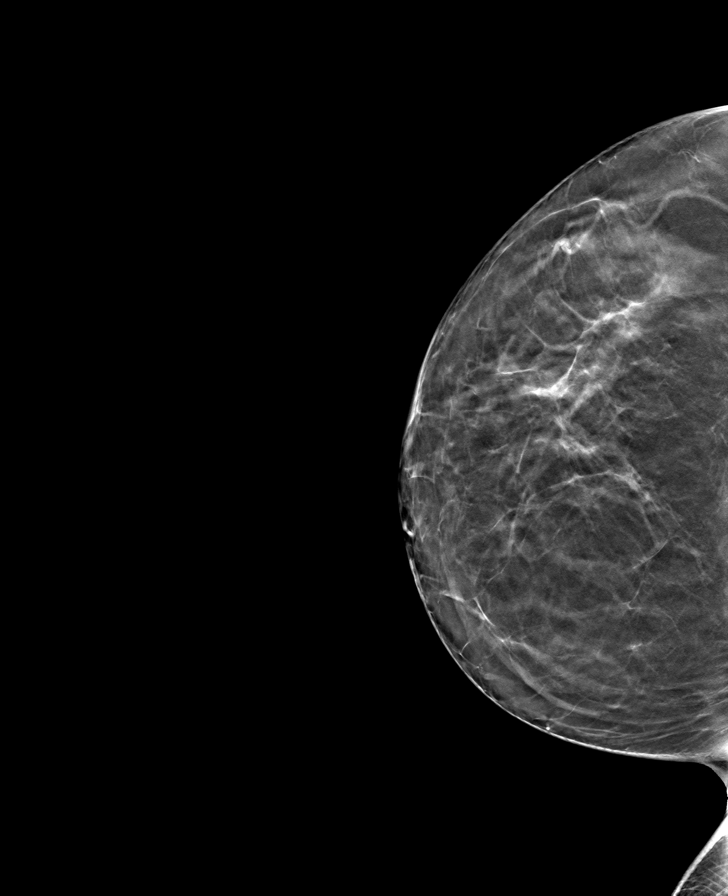

[L CC tomo · tomo slice 37/72.0]
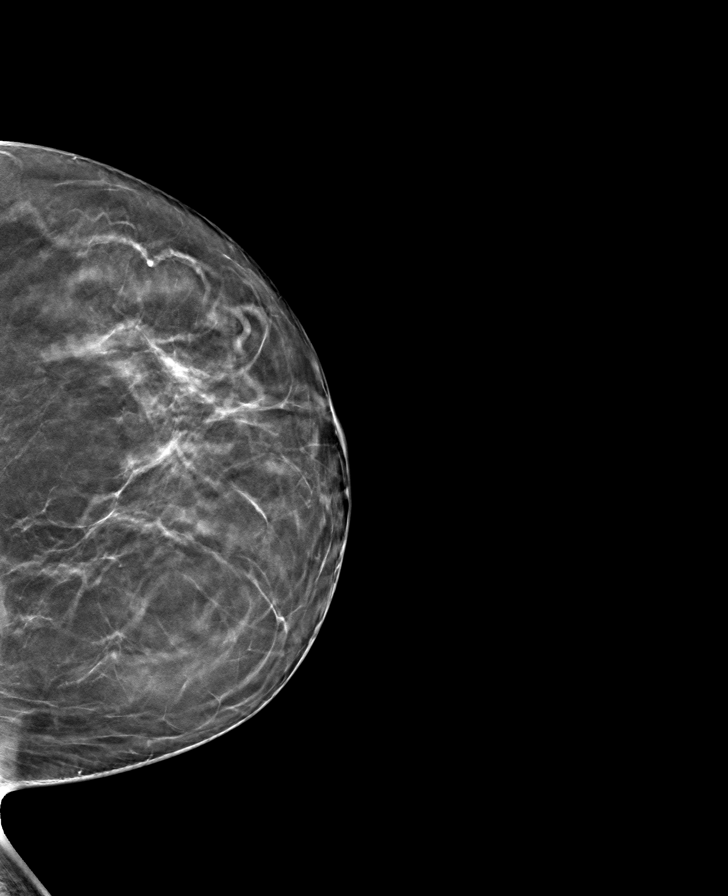

[R MLO tomo · tomo slice 37/74.0]
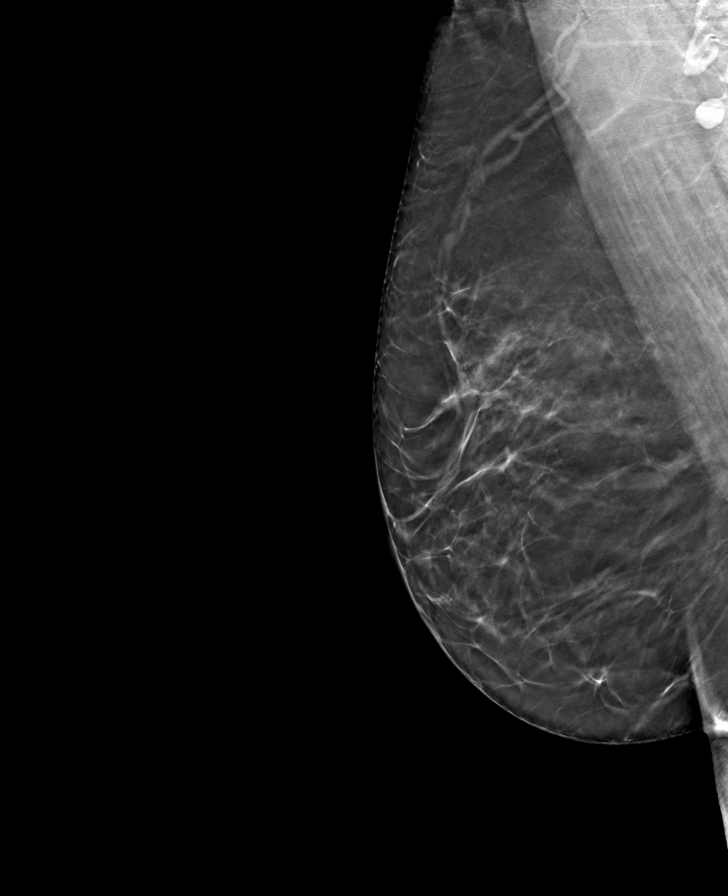

[8 of 24 positions shown; findings below may reference images not displayed]

ACR Breast Density Category b: There are scattered areas of
fibroglandular density.
FINDINGS: There are no findings suspicious for malignancy. Images were
processed with CAD.
IMPRESSION: No mammographic evidence of malignancy. A result letter of this
screening mammogram will be mailed directly to the patient.

RECOMMENDATION:
Screening mammogram in one year. (Code:Y5-G-EJ6)

BI-RADS CATEGORY  1: Negative.

## 2020-11-05 ENCOUNTER — Encounter: Payer: Self-pay | Admitting: *Deleted

## 2020-11-10 ENCOUNTER — Ambulatory Visit (INDEPENDENT_AMBULATORY_CARE_PROVIDER_SITE_OTHER): Payer: Medicare HMO | Admitting: Internal Medicine

## 2020-11-10 ENCOUNTER — Other Ambulatory Visit: Payer: Self-pay

## 2020-11-10 ENCOUNTER — Encounter: Payer: Self-pay | Admitting: Internal Medicine

## 2020-11-10 VITALS — BP 147/83 | HR 69 | Temp 98.4°F | Resp 16 | Ht 61.0 in | Wt 166.4 lb

## 2020-11-10 DIAGNOSIS — E038 Other specified hypothyroidism: Secondary | ICD-10-CM

## 2020-11-10 DIAGNOSIS — K219 Gastro-esophageal reflux disease without esophagitis: Secondary | ICD-10-CM | POA: Diagnosis not present

## 2020-11-10 DIAGNOSIS — E782 Mixed hyperlipidemia: Secondary | ICD-10-CM | POA: Diagnosis not present

## 2020-11-10 DIAGNOSIS — F419 Anxiety disorder, unspecified: Secondary | ICD-10-CM

## 2020-11-10 DIAGNOSIS — E063 Autoimmune thyroiditis: Secondary | ICD-10-CM

## 2020-11-10 DIAGNOSIS — I10 Essential (primary) hypertension: Secondary | ICD-10-CM

## 2020-11-10 MED ORDER — LOSARTAN POTASSIUM 50 MG PO TABS: 50.0000 mg | ORAL_TABLET | Freq: Every day | ORAL | 0 refills | Status: DC

## 2020-11-10 MED ORDER — ATORVASTATIN CALCIUM 80 MG PO TABS: 80.0000 mg | ORAL_TABLET | Freq: Every day | ORAL | 1 refills | Status: DC

## 2020-11-10 NOTE — Assessment & Plan Note (Signed)
On Protonix

## 2020-11-10 NOTE — Progress Notes (Signed)
Established Patient Office Visit  Subjective:  Patient ID: Victoria Deleon, female    DOB: [birth year]  Age: 55 y.o. MRN: [id number]  CC:  Chief Complaint  Patient presents with   Follow-up    Follow up pt had thyroid labs drawn this am also would like to discuss last labs that were drawn feels very fatigued all the time     HPI Victoria Deleon presents for follow up of her chronic medical conditions.  Hypothyroidism: Her TSH is still elevated despite her being on Levothyroxine 200 mcg QD. She takes it in morning on empty stomach. She has been feeling tired. She has intermittent constipation and diarrhea. She is going to follow up with GI to discuss about IBS treatment.  HTN: Her BP was elevated in the office today. She is on Metoprolol, but it is not clear whether she is taking it or not. She denies any chest pain, dyspnea or palpitations.  HLD: Her cholesterol is elevated despite taking Atorvastatin.  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 Gala Romney: Normal terminal ileum with negative biopsies, normal-appearing colon with random colon biopsies negative, internal  hemorrhoids.  Next colonoscopy 10 years.   ESOPHAGOGASTRODUODENOSCOPY (EGD) WITH PROPOFOL N/A 04/24/2018   Dr. Gala Romney: Severe ulcerative reflux esophagitis, small hiatal hernia, small bowel biopsies negative for celiac.   EYE SURGERY N/A    [employer] 03/04/2020   JOINT REPLACEMENT N/A    [employer] 01/22/2020   MEDIAL PARTIAL KNEE REPLACEMENT Left    TOTAL KNEE REVISION Left 09/10/2019   Procedure: LEFT TOTAL KNEE REVISION;  Surgeon: Frederik Pear, MD;  Location: [facility];  Service: Orthopedics;  Laterality: Left;    Family History  Adopted: Yes  Problem Relation Age of Onset   Colon cancer Neg Hx     Social History   Socioeconomic History   Marital status: Married    Spouse name: Fritz Pickerel    Number of children: Not on file   Years of education: Not on file   Highest education level: Not on file  Occupational History    Comment: medical disability   Tobacco Use   Smoking status: Every Day    Packs/day: 0.25    Years: 30.00    Pack years: 7.50    Types: Cigarettes   Smokeless tobacco: Never  Vaping Use   Vaping Use: Never used  Substance and Sexual Activity   Alcohol use: Yes    Comment: rare-maybe 2 glasses of wine/year   Drug use: Never   Sexual activity: Not Currently    Birth control/protection:  Surgical    Comment: hyst  Other Topics Concern   Not on file  Social History Narrative   Lives with husband Fritz Pickerel          Enjoys: kayaking, outside events       Diet: eats all food groups outside meat    Caffeine: tea 2 cups daily    Water: 5-6 cups daily or more      Wears seat belt   Does not use phone while driving    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, fast food safe area   Social Determinants of Health   Financial Resource Strain: High Risk   Difficulty of Paying Living Expenses: Hard  Food Insecurity: Landscape architect Present   Worried About Charity fundraiser in the Last Year: Sometimes true   Arboriculturist in the Last Year: Sometimes true   Transportation Needs: No Transportation Needs   Lack of Transportation (Medical): No   Lack of Transportation (Non-Medical): No  Physical Activity: Inactive   Days of Exercise per Week: 0 days   Minutes of Exercise per Session: 0 min  Stress: Stress Concern Present   Feeling of Stress : To some extent  Social Connections: Moderately Isolated   Frequency of Communication with Friends and Family: Once a week   Frequency of Social Gatherings with Friends and Family: More than three times a week   Attends Religious Services: Never   Marine scientist or Organizations: No   Attends Music therapist: Never   Marital Status: Married  Human resources officer Violence: Not At Risk   Fear of Current or Ex-Partner: No   Emotionally Abused: No   Physically Abused: No   Sexually Abused: No    Outpatient Medications Prior to Visit  Medication Sig Dispense Refill   bisacodyl (DULCOLAX) 10 MG suppository Place 1 suppository (10 mg total) rectally as needed for moderate constipation. 12 suppository 0   busPIRone (BUSPAR) 7.5 MG tablet Take 1 tablet (7.5 mg total) by mouth 3 (three) times daily. 90 tablet 1   doxepin (SINEQUAN) 25 MG capsule TAKE 1 CAPSULE BY MOUTHHONCE AT BEDTIME.     EPINEPHrine 0.3 mg/0.3 mL IJ SOAJ injection Inject 0.3 mg into the muscle as needed for anaphylaxis. 1 each 2   FIBER PO Take 1 capsule by mouth 2 (two) times daily.      gabapentin (NEURONTIN) 300 MG capsule TAKE 1 CAPSULE BY MOUTHCTHREE TIMES A DAY.     levothyroxine (EUTHYROX) 200 MCG tablet Take 1 tablet (200 mcg total) by mouth daily before breakfast. 90 tablet 3   LINZESS 72 MCG capsule TAKE 1 CAPSULE BY MOUTH ONCE DAILY. 30 capsule 0   methocarbamol (ROBAXIN) 500 MG tablet Take 500 mg by mouth 2 (two) times daily.     Multiple Vitamin (MULTIVITAMIN WITH MINERALS) TABS tablet Take 1 tablet by mouth daily.     pantoprazole (PROTONIX) 40 MG tablet TAKE 1 TABLET BY MOUTH ONCE DAILY. 30 tablet 0    sertraline (ZOLOFT) 100 MG tablet Take 1.5 tablets (150 mg total) by mouth daily. 45 tablet 1   tiZANidine (ZANAFLEX) 2 MG tablet Take 1 tablet (2 mg total) by mouth every 6 (six) hours as needed. 60 tablet 0   atorvastatin (LIPITOR) 80 MG tablet Take 1 tablet (80 mg total) by mouth at bedtime. 90 tablet 0   metoprolol succinate (TOPROL-XL) 25 MG 24 hr tablet Take 1 tablet (25 mg total) by mouth  daily. 30 tablet 3   No facility-administered medications prior to visit.    Allergies  Allergen Reactions   Codeine Anaphylaxis    ROS Review of Systems  Constitutional:  Positive for fatigue. Negative for chills and fever.  HENT:  Negative for congestion, sinus pressure, sinus pain and sore throat.   Eyes:  Negative for pain and discharge.  Respiratory:  Negative for cough and shortness of breath.   Cardiovascular:  Negative for chest pain and palpitations.  Gastrointestinal:  Negative for abdominal pain, constipation, diarrhea, nausea and vomiting.  Endocrine: Negative for polydipsia and polyuria.  Genitourinary:  Negative for dysuria and hematuria.  Musculoskeletal:  Negative for neck pain and neck stiffness.  Skin:  Negative for rash.  Neurological:  Negative for dizziness and weakness.  Psychiatric/Behavioral:  Positive for sleep disturbance. Negative for agitation and behavioral problems.      Objective:    Physical Exam Vitals reviewed.  Constitutional:      General: She is not in acute distress.    Appearance: She is not diaphoretic.  HENT:     Head: Normocephalic and atraumatic.     Nose: Nose normal. No congestion.     Mouth/Throat:     Mouth: Mucous membranes are moist.     Pharynx: No posterior oropharyngeal erythema.  Eyes:     General: No scleral icterus.    Extraocular Movements: Extraocular movements intact.  Cardiovascular:     Rate and Rhythm: Normal rate and regular rhythm.     Pulses: Normal pulses.     Heart sounds: Normal heart sounds. No murmur  heard. Pulmonary:     Breath sounds: Normal breath sounds. No wheezing or rales.  Abdominal:     Palpations: Abdomen is soft.     Tenderness: There is no abdominal tenderness.  Musculoskeletal:     Cervical back: Neck supple. No tenderness.     Right lower leg: No edema.     Left lower leg: No edema.  Skin:    General: Skin is warm.     Findings: No rash.  Neurological:     General: No focal deficit present.     Mental Status: She is alert and oriented to person, place, and time.  Psychiatric:        Mood and Affect: Mood normal.        Behavior: Behavior normal.    BP (!) 147/83 (BP Location: Left Arm, Patient Position: Sitting, Cuff Size: Normal)   Pulse 69   Temp 98.4 F (36.9 C) (Oral)   Resp 16   Ht 5' 1"  (1.549 m)   Wt 166 lb 6.4 oz (75.5 kg)   SpO2 96%   BMI 31.44 kg/m  Wt Readings from Last 3 Encounters:  11/10/20 166 lb 6.4 oz (75.5 kg)  08/27/20 167 lb 1.9 oz (75.8 kg)  08/01/20 168 lb 12.8 oz (76.6 kg)     Health Maintenance Due  Topic Date Due   Pneumococcal Vaccine 57-61 Years old (1 - PCV) Never done   HIV Screening  Never done   Hepatitis C Screening  Never done   Zoster Vaccines- Shingrix (1 of 2) Never done    There are no preventive care reminders to display for this patient.  Lab Results  Component Value Date   TSH 23.800 (H) 10/30/2020   Lab Results  Component Value Date   WBC 5.9 10/30/2020   HGB 15.3 10/30/2020   HCT 44.9 10/30/2020   MCV 90 10/30/2020  PLT 304 10/30/2020   Lab Results  Component Value Date   NA 138 10/30/2020   K 4.3 10/30/2020   CO2 22 10/30/2020   GLUCOSE 100 (H) 10/30/2020   BUN 7 10/30/2020   CREATININE 0.86 10/30/2020   BILITOT 0.4 10/30/2020   ALKPHOS 73 10/30/2020   AST 17 10/30/2020   ALT 16 10/30/2020   PROT 6.7 10/30/2020   ALBUMIN 5.0 (H) 10/30/2020   CALCIUM 9.7 10/30/2020   ANIONGAP 9 08/30/2019   EGFR 80 10/30/2020   Lab Results  Component Value Date   CHOL 348 (H) 10/30/2020   Lab  Results  Component Value Date   HDL 56 10/30/2020   Lab Results  Component Value Date   LDLCALC 262 (H) 10/30/2020   Lab Results  Component Value Date   TRIG 154 (H) 10/30/2020   Lab Results  Component Value Date   CHOLHDL 6.2 (H) 10/30/2020   No results found for: HGBA1C    Assessment & Plan:   Problem List Items Addressed This Visit       Cardiovascular and Mediastinum   Essential hypertension - Primary    BP Readings from Last 1 Encounters:  11/10/20 (!) 147/83  uncontrolled with Metoprolol Changed to Losartan 50 mg QD Counseled for compliance with the medications Advised DASH diet and moderate exercise/walking, at least 150 mins/week        Relevant Medications   atorvastatin (LIPITOR) 80 MG tablet   losartan (COZAAR) 50 MG tablet     Digestive   GERD (gastroesophageal reflux disease)    On Protonix         Endocrine   Hypothyroidism    Lab Results  Component Value Date   TSH 23.800 (H) 10/30/2020  On Levothyroxine 200 mcg QD Has underlying GI problem, which could be affecting Levothyroxine absorption - advised to discuss with GI Continue to follow up with Endocrinology         Other   Anxiety    On Zoloft 150 mg QD Doxepin for insomnia       Hyperlipidemia    On Atorvastatin 80 mg QD Lipid profile reviewed - likely due to uncontrolled hypothyroidism       Relevant Medications   atorvastatin (LIPITOR) 80 MG tablet   losartan (COZAAR) 50 MG tablet    Meds ordered this encounter  Medications   atorvastatin (LIPITOR) 80 MG tablet    Sig: Take 1 tablet (80 mg total) by mouth at bedtime.    Dispense:  90 tablet    Refill:  1   losartan (COZAAR) 50 MG tablet    Sig: Take 1 tablet (50 mg total) by mouth daily.    Dispense:  90 tablet    Refill:  0    Follow-up: Return in about 4 months (around 03/12/2021) for HTN.    Lindell Spar, MD

## 2020-11-10 NOTE — Assessment & Plan Note (Signed)
Lab Results  Component Value Date   TSH 23.800 (H) 10/30/2020   On Levothyroxine 200 mcg QD Has underlying GI problem, which could be affecting Levothyroxine absorption - advised to discuss with GI Continue to follow up with Endocrinology

## 2020-11-10 NOTE — Assessment & Plan Note (Signed)
BP Readings from Last 1 Encounters:  11/10/20 (!) 147/83   uncontrolled with Metoprolol Changed to Losartan 50 mg QD Counseled for compliance with the medications Advised DASH diet and moderate exercise/walking, at least 150 mins/week

## 2020-11-10 NOTE — Assessment & Plan Note (Signed)
On Zoloft 150 mg QD Doxepin for insomnia

## 2020-11-10 NOTE — Assessment & Plan Note (Signed)
On Atorvastatin 80 mg QD Lipid profile reviewed - likely due to uncontrolled hypothyroidism

## 2020-11-10 NOTE — Patient Instructions (Signed)
Please stop taking Metoprolol and start taking Losartan 50 mg once daily.  Please continue to follow DASH diet and perform moderate exercise/walking at least 150 mins/week.  Please follow up with GI to discuss about absorption issues.

## 2020-11-11 LAB — T4, FREE: Free T4: 0.6 ng/dL — ABNORMAL LOW (ref 0.82–1.77)

## 2020-11-11 LAB — TSH: TSH: 41.2 u[IU]/mL — ABNORMAL HIGH (ref 0.450–4.500)

## 2020-11-17 ENCOUNTER — Telehealth (INDEPENDENT_AMBULATORY_CARE_PROVIDER_SITE_OTHER): Payer: Medicare HMO | Admitting: Nurse Practitioner

## 2020-11-17 ENCOUNTER — Telehealth: Payer: Self-pay

## 2020-11-17 ENCOUNTER — Encounter: Payer: Self-pay | Admitting: Nurse Practitioner

## 2020-11-17 DIAGNOSIS — E038 Other specified hypothyroidism: Secondary | ICD-10-CM | POA: Diagnosis not present

## 2020-11-17 DIAGNOSIS — E063 Autoimmune thyroiditis: Secondary | ICD-10-CM | POA: Diagnosis not present

## 2020-11-17 MED ORDER — LEVOTHYROXINE SODIUM 125 MCG/ML PO SOLN: 125.0000 ug | Freq: Every day | ORAL | 3 refills | Status: DC

## 2020-11-17 NOTE — Telephone Encounter (Signed)
Clarified w/ CA that this is for the solution.

## 2020-11-17 NOTE — Patient Instructions (Signed)

## 2020-11-17 NOTE — Progress Notes (Signed)
Endocrinology Follow up Visit                                        11/17/2020, 8:30 AM     TELEHEALTH VISIT: The patient is being engaged in telehealth visit due to COVID-19.  This type of visit limits physical examination significantly, and thus is not preferable over face-to-face encounters.  I connected with  Victoria Deleon on 11/17/20 by a video enabled telemedicine application and verified that I am speaking with the correct person using two identifiers.   I discussed the limitations of evaluation and management by telemedicine. The patient expressed understanding and agreed to proceed.    The participants involved in this visit include: Brita Romp, NP located at [facility] and Nastacia Raybuck  located at their personal residence listed.       Victoria Deleon is a 55 y.o.-year-old female patient being seen in follow up after being seen in consultation for hypothyroidism referred by Perlie Mayo, NP.  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  Gala Romney: Normal terminal ileum with negative biopsies, normal-appearing colon with random colon biopsies negative, internal hemorrhoids.  Next colonoscopy 10 years.   ESOPHAGOGASTRODUODENOSCOPY (EGD) WITH PROPOFOL N/A 04/24/2018   Dr. Gala Romney: Severe ulcerative reflux esophagitis, small hiatal hernia, small bowel biopsies negative for celiac.   EYE SURGERY N/A    Phreesia 03/04/2020   JOINT REPLACEMENT N/A    Phreesia 01/22/2020   MEDIAL PARTIAL KNEE REPLACEMENT Left    TOTAL KNEE REVISION Left 09/10/2019   Procedure: LEFT TOTAL KNEE REVISION;  Surgeon: Frederik Pear, MD;  Location: [facility];  Service: Orthopedics;  Laterality: Left;    Social History   Socioeconomic History   Marital status: Married    Spouse name: Fritz Pickerel    Number of children: Not on file   Years of education: Not on file   Highest education level: Not on file  Occupational History    Comment: medical disability   Tobacco Use   Smoking status: Every Day  Packs/day: 0.25    Years: 30.00    Pack years: 7.50    Types: Cigarettes   Smokeless tobacco: Never  Vaping Use   Vaping Use: Never used  Substance and Sexual Activity   Alcohol use: Yes    Comment: rare-maybe 2 glasses of wine/year   Drug use: Never   Sexual activity: Not Currently    Birth control/protection: Surgical    Comment: hyst  Other Topics Concern   Not on file  Social History Narrative   Lives with husband Fritz Pickerel          Enjoys: kayaking, outside events       Diet: eats all food groups outside meat    Caffeine: tea 2 cups daily    Water: 5-6 cups daily or more      Wears seat belt   Does not use phone while driving    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, fast food safe area   Social Determinants of Health   Financial Resource Strain: High Risk   Difficulty of Paying Living Expenses: Hard  Food Insecurity: Landscape architect Present   Worried About Charity fundraiser in the Last Year: Sometimes true   Arboriculturist in the Last  Year: Sometimes true  Transportation Needs: No Transportation Needs   Lack of Transportation (Medical): No   Lack of Transportation (Non-Medical): No  Physical Activity: Inactive   Days of Exercise per Week: 0 days   Minutes of Exercise per Session: 0 min  Stress: Stress Concern Present   Feeling of Stress : To some extent  Social Connections: Moderately Isolated   Frequency of Communication with Friends and Family: Once a week   Frequency of Social Gatherings with Friends and Family: More than three times a week   Attends Religious Services: Never   Marine scientist or Organizations: No   Attends Music therapist: Never   Marital Status: Married    Family History  Adopted: Yes  Problem Relation Age of Onset   Colon cancer Neg Hx     Outpatient Encounter Medications as of 11/17/2020  Medication Sig   Levothyroxine Sodium 125 MCG/ML SOLN Take 125 mcg by mouth daily before breakfast.   atorvastatin (LIPITOR) 80 MG tablet Take 1 tablet (80 mg total) by mouth at bedtime.   bisacodyl (DULCOLAX) 10 MG suppository Place 1 suppository (10 mg total) rectally as needed for moderate constipation.   busPIRone (BUSPAR) 7.5 MG tablet Take 1 tablet (7.5 mg total) by mouth 3 (three) times daily.   doxepin (SINEQUAN) 25 MG capsule TAKE 1 CAPSULE BY MOUTHHONCE AT BEDTIME.   EPINEPHrine 0.3 mg/0.3 mL IJ SOAJ injection Inject 0.3 mg into the muscle as needed for anaphylaxis.   FIBER PO Take 1 capsule by mouth 2 (two) times daily.    gabapentin (NEURONTIN) 300 MG capsule TAKE 1 CAPSULE BY MOUTHCTHREE TIMES A DAY.   LINZESS 72 MCG capsule TAKE 1 CAPSULE BY MOUTH ONCE DAILY.   losartan (COZAAR) 50 MG tablet Take 1 tablet (50 mg total) by mouth daily.   methocarbamol (ROBAXIN) 500 MG tablet Take 500 mg by mouth 2 (two) times daily.   Multiple Vitamin (MULTIVITAMIN WITH MINERALS) TABS tablet Take 1 tablet by mouth daily.   pantoprazole (PROTONIX) 40 MG tablet TAKE 1 TABLET BY MOUTH  ONCE DAILY.   sertraline (ZOLOFT) 100 MG tablet Take 1.5 tablets (150 mg total) by mouth daily.   tiZANidine (ZANAFLEX) 2 MG  tablet Take 1 tablet (2 mg total) by mouth every 6 (six) hours as needed.   [DISCONTINUED] levothyroxine (EUTHYROX) 200 MCG tablet Take 1 tablet (200 mcg total) by mouth daily before breakfast.   No facility-administered encounter medications on file as of 11/17/2020.    ALLERGIES: Allergies  Allergen Reactions   Codeine Anaphylaxis   VACCINATION STATUS: Immunization History  Administered Date(s) Administered   Moderna Sars-Covid-2 Vaccination 11/15/2019, 01/04/2020   Tdap 08/27/2020     HPI    Victoria Deleon  is a patient with the above medical history. she was diagnosed with hypothyroidism at approximate age of 81 years, which required subsequent initiation of Levothyroxine therapy. she was given various doses of thyroid hormone replacement over the years.  she reports compliance to this medication:  Taking it daily on empty stomach with water, separated by >30 minutes before breakfast and other medications, and by at least 4 hours from calcium, iron, PPIs, multivitamins.  I reviewed patient's thyroid tests:  Lab Results  Component Value Date   TSH 41.200 (H) 11/10/2020   TSH 23.800 (H) 10/30/2020   TSH 12.500 (H) 09/09/2020   TSH 34.600 (H) 07/31/2020   TSH 33.600 (H) 05/21/2020   TSH 31.100 (H) 04/14/2020   TSH 32.800 (H) 03/10/2020   TSH 20.000 (H) 01/29/2020   TSH 17.800 (H) 01/25/2020   FREET4 0.60 (L) 11/10/2020   FREET4 0.84 10/30/2020   FREET4 1.05 09/09/2020   FREET4 0.70 (L) 07/31/2020   FREET4 0.72 (L) 05/21/2020   FREET4 0.77 (L) 01/29/2020     Pt describes: - weight gain - profound fatigue - depression/ anxiety - constipation - headaches  Pt denies feeling nodules in neck, hoarseness, dysphagia/odynophagia, SOB with lying down.  she does not know whether she has a family history of thyroid disorders because she was  adopted. 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 She denies recent use of iodine supplements or Biotin containing supplements and stopped eating soy products as much (she is vegan).   Her TPO and thyroglobulin antibodies were positive indicating autoimmune Hashimoto's thyroiditis as the cause for her hypothyroidism.  Her baseline thyroid ultrasound showed heterogeneous thyroid tissue without suspicious nodules further suggesting autoimmune thyroid disease.   I reviewed her chart and she also has a history of Crohn's disease.   ROS:  Constitutional: stable weight, + profound fatigue (having more bad days recntly), no subjective hyperthermia, no subjective hypothermia Eyes: no blurry vision, no xerophthalmia ENT: no sore throat, no nodules palpated in throat, no dysphagia/odynophagia, no hoarseness Cardiovascular: no Chest Pain, no Shortness of Breath, no palpitations, no leg swelling Respiratory: no cough, no SOB Gastrointestinal: no nausea/Vomiting, + intermittent constipation and diarrhea (has history of Crohn's disease and has good days and bad days)- recent GI study showed bowel blockage Musculoskeletal: no muscle/joint aches Skin: no rashes Neurological: no tremors, no numbness, no tingling, no dizziness Psychiatric: + depression / anxiety (hx of PTSD)    OBJECTIVE:  There were no vitals taken for this visit. Wt Readings from Last 3 Encounters:  11/10/20 166 lb 6.4 oz (75.5 kg)  08/27/20 167 lb 1.9 oz (75.8 kg)  08/01/20 168 lb 12.8 oz (76.6 kg)   BP Readings from Last 3 Encounters:  11/10/20 (!) 147/83  08/27/20 130/78  08/01/20 118/69    Physical Exam- Telehealth- significantly limited due to nature of visit  Constitutional: There  is no height or weight on file to calculate BMI. , not in acute distress, normal state of mind Respiratory: Adequate  breathing efforts   CMP ( most recent) CMP     Component Value Date/Time   NA 138 10/30/2020 0951   K 4.3 10/30/2020 0951   CL 99 10/30/2020 0951   CO2 22 10/30/2020 0951   GLUCOSE 100 (H) 10/30/2020 0951   GLUCOSE 114 (H) 08/30/2019 0926   BUN 7 10/30/2020 0951   CREATININE 0.86 10/30/2020 0951   CALCIUM 9.7 10/30/2020 0951   PROT 6.7 10/30/2020 0951   ALBUMIN 5.0 (H) 10/30/2020 0951   AST 17 10/30/2020 0951   ALT 16 10/30/2020 0951   ALKPHOS 73 10/30/2020 0951   BILITOT 0.4 10/30/2020 0951   GFRNONAA 66 07/07/2020 1112   GFRAA 76 07/07/2020 1112     Diabetic Labs (most recent): No results found for: HGBA1C   Lipid Panel ( most recent) Lipid Panel     Component Value Date/Time   CHOL 348 (H) 10/30/2020 0951   TRIG 154 (H) 10/30/2020 0951   HDL 56 10/30/2020 0951   CHOLHDL 6.2 (H) 10/30/2020 0951   LDLCALC 262 (H) 10/30/2020 0951   LABVLDL 30 10/30/2020 0951       Lab Results  Component Value Date   TSH 41.200 (H) 11/10/2020   TSH 23.800 (H) 10/30/2020   TSH 12.500 (H) 09/09/2020   TSH 34.600 (H) 07/31/2020   TSH 33.600 (H) 05/21/2020   TSH 31.100 (H) 04/14/2020   TSH 32.800 (H) 03/10/2020   TSH 20.000 (H) 01/29/2020   TSH 17.800 (H) 01/25/2020   FREET4 0.60 (L) 11/10/2020   FREET4 0.84 10/30/2020   FREET4 1.05 09/09/2020   FREET4 0.70 (L) 07/31/2020   FREET4 0.72 (L) 05/21/2020   FREET4 0.77 (L) 01/29/2020       ASSESSMENT / PLAN:  1. Hypothyroidism due to Hashimoto's thyroiditis:  -Unfortunately her thyroid function tests have worsened yet again, likely due to absorption problems associated with her Crohns disease.  She continues to have good and bad days, more bad days recently.  I discussed changing her formulation to liquid form to allow for quicker and more consistent absorption and she is willing to give it a try.  I sent in Rx for Levothyroxine 125 mcg oral solution to her pharmacy.   -It appears she does have a medication absorption  issue which may be related to her chronic PPI use and Crohn's disease contributing to her need for higher doses of thyroid hormone.  - We discussed about correct intake of levothyroxine, at fasting, with water, separated by at least 30 minutes from breakfast, and separated by more than 4 hours from calcium, iron, multivitamins, acid reflux medications (PPIs). -Patient is made aware of the fact that thyroid hormone replacement is needed for life, dose to be adjusted by periodic monitoring of thyroid function tests.          I spent 20 minutes dedicated to the care of this patient on the date of this encounter to include pre-visit review of records, face-to-face time with the patient, and post visit ordering of  testing.   Arlyce Dice  participated in the discussions, expressed understanding, and voiced agreement with the above plans.  All questions were answered to her satisfaction. she is encouraged to contact clinic should she have any questions or concerns prior to her return visit.    FOLLOW UP PLAN:  Return in about 2 months (around 01/17/2021)  for Thyroid follow up, Previsit labs.  Rayetta Pigg, [facility] [address] [place], [place] [address] Phone: [phone] Fax: [phone]  11/17/2020, 8:30 AM

## 2020-11-17 NOTE — Telephone Encounter (Signed)
Beth at [facility] left a VM to clarify if this patient is suppose to be taking Oral Tablets or Solution for her Levothyroxine. She usually takes tablets but a RX was sent in for Solution. Please call Beth at [phone]

## 2020-11-19 DIAGNOSIS — F17200 Nicotine dependence, unspecified, uncomplicated: Secondary | ICD-10-CM | POA: Diagnosis not present

## 2020-11-19 DIAGNOSIS — M5459 Other low back pain: Secondary | ICD-10-CM | POA: Diagnosis not present

## 2020-11-19 DIAGNOSIS — F4312 Post-traumatic stress disorder, chronic: Secondary | ICD-10-CM | POA: Diagnosis not present

## 2020-11-19 DIAGNOSIS — F321 Major depressive disorder, single episode, moderate: Secondary | ICD-10-CM | POA: Diagnosis not present

## 2020-11-19 DIAGNOSIS — G894 Chronic pain syndrome: Secondary | ICD-10-CM | POA: Diagnosis not present

## 2020-11-19 DIAGNOSIS — M545 Low back pain, unspecified: Secondary | ICD-10-CM | POA: Diagnosis not present

## 2020-11-25 DIAGNOSIS — M5459 Other low back pain: Secondary | ICD-10-CM | POA: Diagnosis not present

## 2020-11-25 DIAGNOSIS — E559 Vitamin D deficiency, unspecified: Secondary | ICD-10-CM | POA: Diagnosis not present

## 2020-11-27 DIAGNOSIS — F411 Generalized anxiety disorder: Secondary | ICD-10-CM | POA: Diagnosis not present

## 2020-12-10 ENCOUNTER — Other Ambulatory Visit: Payer: Self-pay | Admitting: Nurse Practitioner

## 2020-12-16 DIAGNOSIS — F321 Major depressive disorder, single episode, moderate: Secondary | ICD-10-CM | POA: Diagnosis not present

## 2020-12-16 DIAGNOSIS — F17200 Nicotine dependence, unspecified, uncomplicated: Secondary | ICD-10-CM | POA: Diagnosis not present

## 2020-12-16 DIAGNOSIS — F4312 Post-traumatic stress disorder, chronic: Secondary | ICD-10-CM | POA: Diagnosis not present

## 2020-12-16 DIAGNOSIS — M5459 Other low back pain: Secondary | ICD-10-CM | POA: Diagnosis not present

## 2020-12-17 ENCOUNTER — Other Ambulatory Visit (INDEPENDENT_AMBULATORY_CARE_PROVIDER_SITE_OTHER): Payer: Self-pay

## 2020-12-17 ENCOUNTER — Telehealth: Payer: Self-pay

## 2020-12-17 MED ORDER — LINACLOTIDE 72 MCG PO CAPS: 72.0000 ug | ORAL_CAPSULE | Freq: Every day | ORAL | 0 refills | Status: DC

## 2020-12-17 NOTE — Telephone Encounter (Signed)
Rx sent 

## 2020-12-17 NOTE — Telephone Encounter (Signed)
Patient called need med refill  LINZESS 72 MCG capsule   Pharmacy:[facility]

## 2020-12-31 DIAGNOSIS — E063 Autoimmune thyroiditis: Secondary | ICD-10-CM | POA: Diagnosis not present

## 2020-12-31 DIAGNOSIS — E038 Other specified hypothyroidism: Secondary | ICD-10-CM | POA: Diagnosis not present

## 2021-01-01 LAB — TSH: TSH: 41.7 u[IU]/mL — ABNORMAL HIGH (ref 0.450–4.500)

## 2021-01-01 LAB — T4, FREE: Free T4: 0.59 ng/dL — ABNORMAL LOW (ref 0.82–1.77)

## 2021-01-07 DIAGNOSIS — F17200 Nicotine dependence, unspecified, uncomplicated: Secondary | ICD-10-CM | POA: Diagnosis not present

## 2021-01-07 DIAGNOSIS — M5459 Other low back pain: Secondary | ICD-10-CM | POA: Diagnosis not present

## 2021-01-07 DIAGNOSIS — F4312 Post-traumatic stress disorder, chronic: Secondary | ICD-10-CM | POA: Diagnosis not present

## 2021-01-07 DIAGNOSIS — F321 Major depressive disorder, single episode, moderate: Secondary | ICD-10-CM | POA: Diagnosis not present

## 2021-01-19 ENCOUNTER — Ambulatory Visit: Payer: Medicare HMO | Admitting: Nurse Practitioner

## 2021-01-19 ENCOUNTER — Other Ambulatory Visit: Payer: Self-pay

## 2021-01-19 ENCOUNTER — Other Ambulatory Visit: Payer: Self-pay | Admitting: Nurse Practitioner

## 2021-01-19 ENCOUNTER — Encounter: Payer: Self-pay | Admitting: Nurse Practitioner

## 2021-01-19 VITALS — BP 127/77 | HR 54 | Ht 61.0 in | Wt 169.0 lb

## 2021-01-19 DIAGNOSIS — E063 Autoimmune thyroiditis: Secondary | ICD-10-CM | POA: Diagnosis not present

## 2021-01-19 DIAGNOSIS — E038 Other specified hypothyroidism: Secondary | ICD-10-CM

## 2021-01-19 MED ORDER — LEVOTHYROXINE SODIUM 150 MCG/ML PO SOLN: 150.0000 ug | Freq: Every day | ORAL | 3 refills | Status: DC

## 2021-01-19 NOTE — Patient Instructions (Signed)

## 2021-01-19 NOTE — Progress Notes (Signed)
Endocrinology Follow up Visit                                        01/19/2021, 9:47 AM   Victoria Deleon is a 55 y.o.-year-old female patient being seen in follow up after being seen in consultation for hypothyroidism referred by Noreene Larsson, NP.  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 Gala Romney: Normal terminal ileum with negative biopsies, normal-appearing colon with random colon biopsies negative, internal hemorrhoids.  Next colonoscopy 10 years.   ESOPHAGOGASTRODUODENOSCOPY (EGD) WITH PROPOFOL N/A 04/24/2018   Dr. Gala Romney: Severe ulcerative reflux esophagitis, small hiatal hernia, small bowel biopsies negative for celiac.   EYE SURGERY N/A    Phreesia 03/04/2020   JOINT REPLACEMENT N/A    Phreesia 01/22/2020   MEDIAL PARTIAL KNEE REPLACEMENT Left    TOTAL KNEE REVISION Left 09/10/2019   Procedure: LEFT TOTAL KNEE REVISION;  Surgeon: Frederik Pear, MD;  Location: [facility];  Service: Orthopedics;  Laterality: Left;    Social History   Socioeconomic History   Marital status: Married     Spouse name: Fritz Pickerel    Number of children: Not on file   Years of education: Not on file   Highest education level: Not on file  Occupational History    Comment: medical disability   Tobacco Use   Smoking status: Every Day    Packs/day: 0.25    Years: 30.00    Pack years: 7.50    Types: Cigarettes   Smokeless tobacco: Never  Vaping Use   Vaping Use: Never used  Substance and Sexual Activity   Alcohol use: Yes    Comment: rare-maybe 2 glasses of wine/year   Drug use: Never   Sexual activity: Not Currently    Birth control/protection: Surgical    Comment: hyst  Other Topics Concern   Not on file  Social History Narrative   Lives with husband Fritz Pickerel          Enjoys: kayaking, outside events       Diet: eats all  food groups outside meat    Caffeine: tea 2 cups daily    Water: 5-6 cups daily or more      Wears seat belt   Does not use phone while driving    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, fast food safe area   Social Determinants of Health   Financial Resource Strain: High Risk   Difficulty of Paying Living Expenses: Hard  Food Insecurity: Food Insecurity Present   Worried About Charity fundraiser in the Last Year: Sometimes true   Arboriculturist in the Last Year: Sometimes true  Transportation Needs: No Transportation Needs   Lack of Transportation (Medical): No   Lack of Transportation (Non-Medical): No  Physical Activity: Inactive   Days of Exercise per Week: 0 days   Minutes of Exercise per Session: 0 min  Stress: Stress Concern Present   Feeling of Stress : To some extent  Social Connections: Moderately Isolated   Frequency of Communication with Friends and Family: Once a week   Frequency of Social Gatherings with Friends and Family: More than three times a week   Attends Religious Services: Never   Marine scientist or Organizations: No   Attends Music therapist: Never   Marital Status: Married    Family History  Adopted: Yes   Problem Relation Age of Onset   Colon cancer Neg Hx     Outpatient Encounter Medications as of 01/19/2021  Medication Sig   Levothyroxine Sodium 150 MCG/ML SOLN Take 150 mcg by mouth daily before breakfast.   atorvastatin (LIPITOR) 80 MG tablet Take 1 tablet (80 mg total) by mouth at bedtime.   bisacodyl (DULCOLAX) 10 MG suppository Place 1 suppository (10 mg total) rectally as needed for moderate constipation.   busPIRone (BUSPAR) 7.5 MG tablet Take 1 tablet (7.5 mg total) by mouth 3 (three) times daily.   doxepin (SINEQUAN) 25 MG capsule TAKE 1 CAPSULE BY MOUTHHONCE AT BEDTIME.   EPINEPHrine 0.3 mg/0.3 mL IJ SOAJ injection Inject 0.3 mg into the muscle as needed for anaphylaxis.   FIBER PO Take 1 capsule by mouth 2 (two) times daily.    gabapentin (NEURONTIN) 300 MG capsule TAKE 1 CAPSULE BY MOUTHCTHREE TIMES A DAY.   linaclotide (LINZESS) 72 MCG capsule Take 1 capsule (72 mcg total) by mouth daily.   losartan (COZAAR) 50 MG tablet Take 1 tablet (50 mg total) by mouth daily.   methocarbamol (ROBAXIN) 500 MG tablet Take 500 mg by mouth 2 (two) times daily.   Multiple Vitamin (MULTIVITAMIN WITH MINERALS) TABS tablet Take 1 tablet by mouth daily.   pantoprazole (PROTONIX) 40 MG tablet TAKE 1 TABLET BY MOUTH ONCE DAILY.   sertraline (ZOLOFT) 100 MG tablet Take 1.5 tablets (150 mg total) by mouth daily.   tiZANidine (ZANAFLEX) 2 MG tablet Take 1 tablet (2 mg total) by mouth every 6 (six) hours as needed.   [DISCONTINUED] Levothyroxine Sodium 125 MCG/ML SOLN Take 125 mcg by mouth daily before breakfast.   No facility-administered encounter medications on file as of 01/19/2021.    ALLERGIES: Allergies  Allergen Reactions   Codeine Anaphylaxis   VACCINATION STATUS: Immunization History  Administered Date(s) Administered   Moderna Sars-Covid-2 Vaccination 11/15/2019, 01/04/2020   Tdap 08/27/2020     HPI    Victoria Deleon  is a patient with the above medical history. she was  diagnosed with hypothyroidism at approximate age of 82 years, which required subsequent initiation of  Levothyroxine therapy. she was given various doses of thyroid hormone replacement over the years.  she reports compliance to this medication:  Taking it daily on empty stomach with water, separated by >30 minutes before breakfast and other medications, and by at least 4 hours from calcium, iron, PPIs, multivitamins.  I reviewed patient's thyroid tests:  Lab Results  Component Value Date   TSH 41.700 (H) 12/31/2020   TSH 41.200 (H) 11/10/2020   TSH 23.800 (H) 10/30/2020   TSH 12.500 (H) 09/09/2020   TSH 34.600 (H) 07/31/2020   TSH 33.600 (H) 05/21/2020   TSH 31.100 (H) 04/14/2020   TSH 32.800 (H) 03/10/2020   TSH 20.000 (H) 01/29/2020   TSH 17.800 (H) 01/25/2020   FREET4 0.59 (L) 12/31/2020   FREET4 0.60 (L) 11/10/2020   FREET4 0.84 10/30/2020   FREET4 1.05 09/09/2020   FREET4 0.70 (L) 07/31/2020   FREET4 0.72 (L) 05/21/2020   FREET4 0.77 (L) 01/29/2020     Pt describes: - profound fatigue - depression/ anxiety - constipation  Pt denies feeling nodules in neck, hoarseness, dysphagia/odynophagia, SOB with lying down.  she does not know whether she has a family history of thyroid disorders because she was adopted. 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 She denies recent use of iodine supplements or Biotin containing supplements and stopped eating soy products as much (she is vegan).  Her TPO and thyroglobulin antibodies were positive indicating autoimmune Hashimoto's thyroiditis as the cause for her hypothyroidism.  Her baseline thyroid ultrasound showed heterogeneous thyroid tissue without suspicious nodules further suggesting autoimmune thyroid disease.   I reviewed her chart and she also has a history of Crohn's disease.   ROS:  Constitutional:  stable weight, + profound fatigue (has good and bad days), no subjective hyperthermia, no subjective hypothermia Eyes: no blurry vision, no xerophthalmia ENT: no sore throat, no nodules palpated in throat, no dysphagia/odynophagia, no hoarseness Cardiovascular: no Chest Pain, no Shortness of Breath, no palpitations, no leg swelling Respiratory: no cough, no SOB Gastrointestinal: no nausea/Vomiting, + intermittent constipation and diarrhea (has history of Crohn's disease and has good days and bad days)- recent GI study showed bowel blockage- has another follow up with GI next week. Musculoskeletal: no muscle/joint aches Skin: no rashes Neurological: no tremors, no numbness, no tingling, no dizziness Psychiatric: + depression / anxiety (hx of PTSD)    OBJECTIVE:  BP 127/77   Pulse (!) 54   Ht 5' 1"  (1.549 m)   Wt 169 lb (76.7 kg)   BMI 31.93 kg/m  Wt Readings from Last 3 Encounters:  01/19/21 169 lb (76.7 kg)  11/10/20 166 lb 6.4 oz (75.5 kg)  08/27/20 167 lb 1.9 oz (75.8 kg)   BP Readings from Last 3 Encounters:  01/19/21 127/77  11/10/20 (!) 147/83  08/27/20 130/78     Physical Exam- Limited  Constitutional:  Body mass index is 31.93 kg/m. , not in acute distress, normal state of mind Eyes:  EOMI, no exophthalmos Neck: Supple Cardiovascular: RRR, no murmurs, rubs, or gallops, no edema Respiratory: Adequate breathing efforts, no crackles, rales, rhonchi, or wheezing Musculoskeletal: no gross deformities, strength intact in all four extremities, no gross restriction of joint movements Skin:  no rashes, no hyperemia Neurological: no tremor with outstretched hands   CMP ( most recent) CMP     Component Value Date/Time   NA 138 10/30/2020 0951  K 4.3 10/30/2020 0951   CL 99 10/30/2020 0951   CO2 22 10/30/2020 0951   GLUCOSE 100 (H) 10/30/2020 0951   GLUCOSE 114 (H) 08/30/2019 0926   BUN 7 10/30/2020 0951   CREATININE 0.86 10/30/2020 0951   CALCIUM 9.7 10/30/2020  0951   PROT 6.7 10/30/2020 0951   ALBUMIN 5.0 (H) 10/30/2020 0951   AST 17 10/30/2020 0951   ALT 16 10/30/2020 0951   ALKPHOS 73 10/30/2020 0951   BILITOT 0.4 10/30/2020 0951   GFRNONAA 66 07/07/2020 1112   GFRAA 76 07/07/2020 1112     Diabetic Labs (most recent): No results found for: HGBA1C   Lipid Panel ( most recent) Lipid Panel     Component Value Date/Time   CHOL 348 (H) 10/30/2020 0951   TRIG 154 (H) 10/30/2020 0951   HDL 56 10/30/2020 0951   CHOLHDL 6.2 (H) 10/30/2020 0951   LDLCALC 262 (H) 10/30/2020 0951   LABVLDL 30 10/30/2020 0951       Lab Results  Component Value Date   TSH 41.700 (H) 12/31/2020   TSH 41.200 (H) 11/10/2020   TSH 23.800 (H) 10/30/2020   TSH 12.500 (H) 09/09/2020   TSH 34.600 (H) 07/31/2020   TSH 33.600 (H) 05/21/2020   TSH 31.100 (H) 04/14/2020   TSH 32.800 (H) 03/10/2020   TSH 20.000 (H) 01/29/2020   TSH 17.800 (H) 01/25/2020   FREET4 0.59 (L) 12/31/2020   FREET4 0.60 (L) 11/10/2020   FREET4 0.84 10/30/2020   FREET4 1.05 09/09/2020   FREET4 0.70 (L) 07/31/2020   FREET4 0.72 (L) 05/21/2020   FREET4 0.77 (L) 01/29/2020       ASSESSMENT / PLAN:  1. Hypothyroidism due to Hashimoto's thyroiditis:  -It appears she does have a medication absorption issue which may be related to her chronic PPI use and Crohn's disease contributing to her need for higher doses of thyroid hormone.  Her previsit thyroid function tests are stable but show she is not getting enough thyroid hormone.  I discussed and increased her dose of Levothyroxine solution to 150 mcg po daily before breakfast.  She is currently working with her insurance company to help cover the cost of this medication.  Because it is a compound medication, she is being charged $50 per month which is too much for her.  - We discussed about correct intake of levothyroxine, at fasting, with water, separated by at least 30 minutes from breakfast, and separated by more than 4 hours from  calcium, iron, multivitamins, acid reflux medications (PPIs). -Patient is made aware of the fact that thyroid hormone replacement is needed for life, dose to be adjusted by periodic monitoring of thyroid function tests.      I spent 22 minutes in the care of the patient today including review of labs from Thyroid Function, CMP, and other relevant labs ; imaging/biopsy records (current and previous including abstractions from other facilities); face-to-face time discussing  her lab results and symptoms, medications doses, her options of short and long term treatment based on the latest standards of care / guidelines;   and documenting the encounter.  Victoria Deleon  participated in the discussions, expressed understanding, and voiced agreement with the above plans.  All questions were answered to her satisfaction. she is encouraged to contact clinic should she have any questions or concerns prior to her return visit.    FOLLOW UP PLAN:  Return in about 3 months (around 04/21/2021) for Thyroid follow up, Previsit labs.  Victoria Deleon  [facility]: [facility] [address] [place], [place] [address] Phone: [phone] Fax: [phone]  01/19/2021, 9:47 AM

## 2021-01-28 ENCOUNTER — Ambulatory Visit: Payer: Medicare HMO | Admitting: Gastroenterology

## 2021-01-28 ENCOUNTER — Other Ambulatory Visit: Payer: Self-pay

## 2021-01-28 ENCOUNTER — Encounter: Payer: Self-pay | Admitting: Gastroenterology

## 2021-01-28 VITALS — BP 149/91 | HR 63 | Temp 97.1°F | Ht 61.0 in | Wt 171.2 lb

## 2021-01-28 DIAGNOSIS — K5000 Crohn's disease of small intestine without complications: Secondary | ICD-10-CM

## 2021-01-28 DIAGNOSIS — K5900 Constipation, unspecified: Secondary | ICD-10-CM | POA: Diagnosis not present

## 2021-01-28 DIAGNOSIS — K219 Gastro-esophageal reflux disease without esophagitis: Secondary | ICD-10-CM | POA: Diagnosis not present

## 2021-01-28 NOTE — Progress Notes (Signed)
Primary Care Physician: Noreene Larsson, NP  Primary Gastroenterologist:  Garfield Cornea, MD   Chief Complaint  Patient presents with   Abdominal Pain    Lower abd few times a week   Nausea   Constipation    Can't have BM for about 3 days. Takes linzess daily now    HPI: Victoria Deleon is a 55 y.o. female here for follow-up.  She was last seen in October 2020.  History of small bowel Crohn's disease diagnosed in [place].  Previously afraid to take mesalamine after nurse advised her not to use it.  She reports that she changed her diet, used aloe vera juice, had repeat colonoscopy with improvement of small bowel mucosa.  EGD locally completed 04/24/2018 which found a rather severe ulcerative reflux esophagitis, small hiatal hernia, otherwise normal.  Biopsies from duodenal mucosa without significant histopathological changes.  Recommended begin pantoprazole 40 mg daily.  Colonoscopy completed the same day, normal TI,entire examined colon normal, internal hemorrhoids, status post ileal biopsy which found mucosa without significant histopathological changes and colon without significant histopathological changes.  Recommended repeat colonoscopy in 10 years.  Amitiza 24 mcg daily with food made her nauseated.  Trulance and Linzess helpful but expensive.  CT abdomen pelvis with and without contrast March 2022 for left lower quadrant and left flank pain.  Unremarkable except for moderate volume of formed stool throughout the colon.  Today: Having a lot of bowel concerns. Linzess not as effective. Decreased oral intake due to bloating. Every few days will have good BMs, almost empties out.  Some mild lower abdominal pain off and on.  Usually a few times per week.  Vegetarian, eats lots of fiber.  Drinks plenty of water.  Believes her thyroid is playing a part in her bowels.  Cannot seem to find the right dose.  Her TSH continues to climb.  Cholesterol significantly elevated as well.  Complains of nausea.  No vomiting.  Heartburn well controlled.  No blood in the stool or melena.         Current Outpatient Medications  Medication Sig Dispense Refill   atorvastatin (LIPITOR) 80 MG tablet Take 1 tablet (80 mg total) by mouth at bedtime. 90 tablet 1   bisacodyl (DULCOLAX) 10 MG suppository Place 1 suppository (10 mg total) rectally as needed for moderate constipation. 12 suppository 0   busPIRone (BUSPAR) 7.5 MG tablet Take 1 tablet (7.5 mg total) by mouth 3 (three) times daily. 90 tablet 1   doxepin (SINEQUAN) 25 MG capsule TAKE 1 CAPSULE BY MOUTHHONCE AT BEDTIME.     EPINEPHrine 0.3 mg/0.3 mL IJ SOAJ injection Inject 0.3 mg into the muscle as needed for anaphylaxis. 1 each 2   FIBER PO Take 1 capsule by mouth 2 (two) times daily.      gabapentin (NEURONTIN) 300 MG capsule TAKE 1 CAPSULE BY MOUTHCTHREE TIMES A DAY.     Levothyroxine Sodium 150 MCG/ML SOLN Take 150 mcg by mouth daily before breakfast. 90 mL 3   linaclotide (LINZESS) 72 MCG capsule Take 1 capsule (72 mcg total) by mouth daily. 30 capsule 0   losartan (COZAAR) 50 MG tablet Take 1 tablet (50 mg total) by mouth daily. 90 tablet 0   methocarbamol (ROBAXIN) 500 MG tablet Take 500 mg by mouth 2 (two) times daily.     Multiple Vitamin (MULTIVITAMIN WITH MINERALS) TABS tablet Take 1 tablet by mouth daily.     pantoprazole (PROTONIX) 40 MG tablet  TAKE 1 TABLET BY MOUTH ONCE DAILY. 30 tablet 0   sertraline (ZOLOFT) 100 MG tablet Take 1.5 tablets (150 mg total) by mouth daily. 45 tablet 1   tiZANidine (ZANAFLEX) 2 MG tablet Take 1 tablet (2 mg total) by mouth every 6 (six) hours as needed. 60 tablet 0   No current facility-administered medications for this visit.    Allergies as of 01/28/2021 - Review Complete 01/28/2021  Allergen Reaction Noted   Codeine Anaphylaxis 06/02/2017    ROS:  General: Negative for anorexia, weight loss, fever, chills, +fatigue, weakness. ENT: Negative for hoarseness, difficulty  swallowing , nasal congestion. CV: Negative for chest pain, angina, palpitations, dyspnea on exertion, peripheral edema.  Respiratory: Negative for dyspnea at rest, dyspnea on exertion, cough, sputum, wheezing.  GI: See history of present illness. GU:  Negative for dysuria, hematuria, urinary incontinence, urinary frequency, nocturnal urination.  Endo: Negative for unusual weight change.    Physical Examination:   BP (!) 149/91   Pulse 63   Temp (!) 97.1 F (36.2 C)   Ht 5' 1"  (1.549 m)   Wt 171 lb 3.2 oz (77.7 kg)   BMI 32.35 kg/m   General: Well-nourished, well-developed in no acute distress.  Eyes: No icterus. Mouth: masked.  Abdomen: Bowel sounds are normal, nondistended, no hepatosplenomegaly or masses, no abdominal bruits or hernia , no rebound or guarding.  Mild lower abdominal tenderness Extremities: No lower extremity edema. No clubbing or deformities. Neuro: Alert and oriented x 4   Skin: Warm and dry, no jaundice.   Psych: Alert and cooperative, normal mood and affect.  Labs:  Lab Results  Component Value Date   TSH 41.700 (H) 12/31/2020   Lab Results  Component Value Date   CREATININE 0.86 10/30/2020   BUN 7 10/30/2020   NA 138 10/30/2020   K 4.3 10/30/2020   CL 99 10/30/2020   CO2 22 10/30/2020   Lab Results  Component Value Date   ALT 16 10/30/2020   AST 17 10/30/2020   ALKPHOS 73 10/30/2020   BILITOT 0.4 10/30/2020   Lab Results  Component Value Date   WBC 5.9 10/30/2020   HGB 15.3 10/30/2020   HCT 44.9 10/30/2020   MCV 90 10/30/2020   PLT 304 10/30/2020   Total cholesterol 348, triglycerides 154, HDL 56, LDL 262.  Imaging Studies: No results found.   Assessment:  Constipation: Poorly controlled.  Likely needs increased dose of Linzess.  Thyroid dysfunction likely contributing.  Some mild lower abdominal pain likely related to constipation.  Doubt Crohn's flare.  GERD: Well-controlled on pantoprazole.  Crohn's disease: She provides  history of small bowel Crohn's disease diagnosed in 2006 in [place].  In 2019 she had a ileocolonoscopy which was unremarkable.  Patient is not on medication.  Continues to do well.  Plan: Increase Linzess 145 mcg daily.  Samples provided.  She will try for 1 week and if she does not find effective, then she can go up to 290 mcg daily.  She will call for prescription if she decides which is the appropriate dose for her. Continue pantoprazole 40 mg daily. Return to the office in 6 months.

## 2021-01-28 NOTE — Patient Instructions (Signed)
Increase Linzess to 145 mcg daily for constipation. Samples provided. Take for one week to see if this dose is more effective. If you are still having issues with constipation, then increase to 240mg daily.  Call in 2 weeks and let me know if bowels moving better and if abdominal pain improved.  Return office visit in 6 months.

## 2021-01-30 DIAGNOSIS — M5416 Radiculopathy, lumbar region: Secondary | ICD-10-CM | POA: Diagnosis not present

## 2021-01-30 DIAGNOSIS — R200 Anesthesia of skin: Secondary | ICD-10-CM | POA: Diagnosis not present

## 2021-02-05 DIAGNOSIS — M5440 Lumbago with sciatica, unspecified side: Secondary | ICD-10-CM | POA: Diagnosis not present

## 2021-02-05 DIAGNOSIS — I10 Essential (primary) hypertension: Secondary | ICD-10-CM | POA: Diagnosis not present

## 2021-02-05 DIAGNOSIS — Z6832 Body mass index (BMI) 32.0-32.9, adult: Secondary | ICD-10-CM | POA: Diagnosis not present

## 2021-02-06 ENCOUNTER — Other Ambulatory Visit: Payer: Self-pay | Admitting: Neurosurgery

## 2021-02-06 ENCOUNTER — Other Ambulatory Visit (HOSPITAL_COMMUNITY): Payer: Self-pay | Admitting: Neurosurgery

## 2021-02-06 DIAGNOSIS — M5440 Lumbago with sciatica, unspecified side: Secondary | ICD-10-CM

## 2021-02-10 ENCOUNTER — Telehealth: Payer: Self-pay

## 2021-02-10 MED ORDER — LINACLOTIDE 290 MCG PO CAPS: 290.0000 ug | ORAL_CAPSULE | Freq: Every day | ORAL | 5 refills | Status: DC

## 2021-02-10 NOTE — Telephone Encounter (Signed)
Pt called stating that the Linzess samples that she was given the last time she was in have not made a difference. Pt states that she is still going 3 to 4 days without a bm.

## 2021-02-10 NOTE — Telephone Encounter (Signed)
RX for linzess sent.

## 2021-02-10 NOTE — Telephone Encounter (Signed)
Pt is going to continue the linzess 290 mcg and add Miralax. Pt is requesting a prescription for the linzess 290 to be sent in to the pharmacy on file.

## 2021-02-10 NOTE — Telephone Encounter (Signed)
Choices somewhat limited as she did not tolerate Amitiza.   We can continue Linzess 232mg daily and ADD Miralax one capful one or two times daily.   Other option is go back to Trulance 367mdaily.  I think her thyroid dysfunction is playing a big part in her constipation issues with her TSH still over 40.

## 2021-02-10 NOTE — Addendum Note (Signed)
Addended by: Mahala Menghini on: 02/10/2021 06:38 PM   Modules accepted: Orders

## 2021-02-10 NOTE — Telephone Encounter (Signed)
Lmom for pt to return my call.  

## 2021-02-13 ENCOUNTER — Other Ambulatory Visit: Payer: Self-pay

## 2021-02-13 ENCOUNTER — Encounter: Payer: Self-pay | Admitting: Nurse Practitioner

## 2021-02-13 ENCOUNTER — Ambulatory Visit (INDEPENDENT_AMBULATORY_CARE_PROVIDER_SITE_OTHER): Payer: Medicare HMO | Admitting: Nurse Practitioner

## 2021-02-13 VITALS — BP 132/74 | HR 60 | Temp 98.3°F | Ht 61.0 in | Wt 169.0 lb

## 2021-02-13 DIAGNOSIS — Z79899 Other long term (current) drug therapy: Secondary | ICD-10-CM

## 2021-02-13 DIAGNOSIS — E039 Hypothyroidism, unspecified: Secondary | ICD-10-CM

## 2021-02-13 HISTORY — DX: Other long term (current) drug therapy: Z79.899

## 2021-02-13 NOTE — Assessment & Plan Note (Signed)
-  pt is taking compounded levothyroxine, and that is getting expensive; she has trouble swallowing and has been using a liquid formulation -she was recently put on linzess by GI, and that is expensive -she states that medicines are becoming unaffordable -referral to pharmacy; attempt to help with affordability

## 2021-02-13 NOTE — Assessment & Plan Note (Addendum)
-  is followed by endocrinology -she is taking compounded levothyroxine, and that is getting more expensive (especially after GI added linzess to her medication regimen) -she has been increasing levothyroxine and TSH is still significantly elevated -she would like a second opinion with a different endocrinologist; will honor that request

## 2021-02-13 NOTE — Progress Notes (Signed)
Acute Office Visit  Subjective:    Patient ID: Victoria Deleon, female    DOB: 01-16-1966, 55 y.o.   MRN: [id number]  Chief Complaint  Patient presents with   Medication Problem    Wants to discuss cholesterol and thyroid medications.     HPI Patient is in today for polypharmacy.  She has elevated TSH, and she states she has been increasing her medication, but her TSH is not improving. She would like to get a second opinion. Also, her LDL is significantly elevated.  She sees therapy for anxiety, and has been taking doxepin for sleep.  She is taking dulcolax suppository 2x per week.  She is followed by endocrinology, and at her last visit on 01/19/21, "Her TPO and thyroglobulin antibodies were positive indicating autoimmune Hashimoto's thyroiditis as the cause for her hypothyroidism.  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 Gala Romney: Normal terminal ileum with negative biopsies, normal-appearing colon with random colon biopsies  negative, internal hemorrhoids.  Next colonoscopy 10 years.   ESOPHAGOGASTRODUODENOSCOPY (EGD) WITH PROPOFOL N/A 04/24/2018   Dr. Gala Romney: Severe ulcerative reflux esophagitis, small hiatal hernia, small bowel biopsies negative for celiac.   EYE SURGERY N/A    Phreesia 03/04/2020   JOINT REPLACEMENT N/A    Phreesia 01/22/2020   MEDIAL PARTIAL KNEE REPLACEMENT Left    TOTAL KNEE REVISION Left 09/10/2019   Procedure: LEFT TOTAL KNEE REVISION;  Surgeon: Frederik Pear, MD;  Location: [facility];  Service: Orthopedics;  Laterality: Left;    Family History  Adopted: Yes  Problem Relation Age of Onset   Colon cancer Neg Hx     Social History   Socioeconomic History   Marital status: Married    Spouse name: Fritz Pickerel    Number of children: Not on file   Years of education: Not on file   Highest education level: Not on file  Occupational History    Comment: medical disability   Tobacco Use   Smoking status: Every Day    Packs/day: 0.25    Years: 30.00    Pack years: 7.50    Types: Cigarettes   Smokeless tobacco: Never  Vaping Use   Vaping Use: Never used  Substance and Sexual Activity   Alcohol use: Yes    Comment: rare-maybe 2 glasses of wine/year   Drug use: Never   Sexual activity: Not Currently    Birth control/protection: Surgical    Comment: hyst  Other Topics Concern  Not on file  Social History Narrative   Lives with husband Fritz Pickerel          Enjoys: kayaking, outside events       Diet: eats all food groups outside meat    Caffeine: tea 2 cups daily    Water: 5-6 cups daily or more      Wears seat belt   Does not use phone while driving    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, fast food safe area   Social Determinants of Health   Financial Resource Strain: High Risk   Difficulty of Paying Living Expenses: Hard  Food Insecurity: Landscape architect Present   Worried About Charity fundraiser in the Last Year: Sometimes true   Arboriculturist in the Last Year: Sometimes  true  Transportation Needs: No Transportation Needs   Lack of Transportation (Medical): No   Lack of Transportation (Non-Medical): No  Physical Activity: Inactive   Days of Exercise per Week: 0 days   Minutes of Exercise per Session: 0 min  Stress: Stress Concern Present   Feeling of Stress : To some extent  Social Connections: Moderately Isolated   Frequency of Communication with Friends and Family: Once a week   Frequency of Social Gatherings with Friends and Family: More than three times a week   Attends Religious Services: Never   Marine scientist or Organizations: No   Attends Music therapist: Never   Marital Status: Married  Human resources officer Violence: Not At Risk   Fear of Current or Ex-Partner: No   Emotionally Abused: No   Physically Abused: No   Sexually Abused: No    Outpatient Medications Prior to Visit  Medication Sig Dispense Refill   atorvastatin (LIPITOR) 80 MG tablet Take 1 tablet (80 mg total) by mouth at bedtime. 90 tablet 1   bisacodyl (DULCOLAX) 10 MG suppository Place 1 suppository (10 mg total) rectally as needed for moderate constipation. 12 suppository 0   busPIRone (BUSPAR) 7.5 MG tablet Take 1 tablet (7.5 mg total) by mouth 3 (three) times daily. 90 tablet 1   doxepin (SINEQUAN) 25 MG capsule TAKE 1 CAPSULE BY MOUTHHONCE AT BEDTIME.     EPINEPHrine 0.3 mg/0.3 mL IJ SOAJ injection Inject 0.3 mg into the muscle as needed for anaphylaxis. 1 each 2   FIBER PO Take 1 capsule by mouth 2 (two) times daily.      gabapentin (NEURONTIN) 300 MG capsule TAKE 1 CAPSULE BY MOUTHCTHREE TIMES A DAY.     Levothyroxine Sodium 150 MCG/ML SOLN Take 150 mcg by mouth daily before breakfast. 90 mL 3   linaclotide (LINZESS) 290 MCG CAPS capsule Take 1 capsule (290 mcg total) by mouth daily before breakfast. 30 capsule 5   losartan (COZAAR) 50 MG tablet Take 1 tablet (50 mg total) by mouth daily. 90 tablet 0   methocarbamol (ROBAXIN) 500 MG tablet Take 500 mg  by mouth 2 (two) times daily.     pantoprazole (PROTONIX) 40 MG tablet TAKE 1 TABLET BY MOUTH ONCE DAILY. 30 tablet 0   sertraline (ZOLOFT) 100 MG tablet Take 1.5 tablets (150 mg total) by mouth daily. 45 tablet 1   tiZANidine (ZANAFLEX) 2 MG tablet Take 1 tablet (2 mg total) by mouth every 6 (six) hours as needed. 60 tablet 0   Multiple Vitamin (MULTIVITAMIN WITH MINERALS) TABS tablet Take 1 tablet by mouth daily.     No facility-administered medications prior to visit.  Allergies  Allergen Reactions   Codeine Anaphylaxis    Review of Systems  Constitutional:  Positive for fatigue.  Respiratory: Negative.    Cardiovascular: Negative.   Psychiatric/Behavioral: Negative.        Objective:    Physical Exam Constitutional:      Appearance: Normal appearance.  Cardiovascular:     Rate and Rhythm: Normal rate and regular rhythm.     Pulses: Normal pulses.     Heart sounds: Normal heart sounds.  Pulmonary:     Effort: Pulmonary effort is normal.     Breath sounds: Normal breath sounds.  Neurological:     Mental Status: She is alert.  Psychiatric:        Mood and Affect: Mood normal.        Behavior: Behavior normal.        Thought Content: Thought content normal.        Judgment: Judgment normal.    BP 132/74 (BP Location: Left Arm, Patient Position: Sitting, Cuff Size: Normal)   Pulse 60   Temp 98.3 F (36.8 C) (Oral)   Ht 5' 1"  (1.549 m)   Wt 169 lb (76.7 kg)   SpO2 95%   BMI 31.93 kg/m  Wt Readings from Last 3 Encounters:  02/13/21 169 lb (76.7 kg)  01/28/21 171 lb 3.2 oz (77.7 kg)  01/19/21 169 lb (76.7 kg)    Health Maintenance Due  Topic Date Due   HIV Screening  Never done   Hepatitis C Screening  Never done   Zoster Vaccines- Shingrix (1 of 2) Never done    There are no preventive care reminders to display for this patient.   Lab Results  Component Value Date   TSH 41.700 (H) 12/31/2020   Lab Results  Component Value Date   WBC 5.9  10/30/2020   HGB 15.3 10/30/2020   HCT 44.9 10/30/2020   MCV 90 10/30/2020   PLT 304 10/30/2020   Lab Results  Component Value Date   NA 138 10/30/2020   K 4.3 10/30/2020   CO2 22 10/30/2020   GLUCOSE 100 (H) 10/30/2020   BUN 7 10/30/2020   CREATININE 0.86 10/30/2020   BILITOT 0.4 10/30/2020   ALKPHOS 73 10/30/2020   AST 17 10/30/2020   ALT 16 10/30/2020   PROT 6.7 10/30/2020   ALBUMIN 5.0 (H) 10/30/2020   CALCIUM 9.7 10/30/2020   ANIONGAP 9 08/30/2019   EGFR 80 10/30/2020   Lab Results  Component Value Date   CHOL 348 (H) 10/30/2020   Lab Results  Component Value Date   HDL 56 10/30/2020   Lab Results  Component Value Date   LDLCALC 262 (H) 10/30/2020   Lab Results  Component Value Date   TRIG 154 (H) 10/30/2020   Lab Results  Component Value Date   CHOLHDL 6.2 (H) 10/30/2020   No results found for: HGBA1C     Assessment & Plan:   Problem List Items Addressed This Visit       Endocrine   Hypothyroidism    -is followed by endocrinology -she is taking compounded levothyroxine, and that is getting more expensive (especially after GI added linzess to her medication regimen) -she has been increasing levothyroxine and TSH is still significantly elevated -she would like a second opinion with a different endocrinologist; will honor that request      Relevant Orders   Ambulatory referral to Endocrinology     Other   Polypharmacy - Primary    -pt  is taking compounded levothyroxine, and that is getting expensive; she has trouble swallowing and has been using a liquid formulation -she was recently put on linzess by GI, and that is expensive -she states that medicines are becoming unaffordable -referral to pharmacy; attempt to help with affordability      Relevant Orders   AMB Referral to [place]     No orders of the defined types were placed in this encounter.    Noreene Larsson, NP

## 2021-02-16 ENCOUNTER — Telehealth: Payer: Self-pay | Admitting: *Deleted

## 2021-02-16 DIAGNOSIS — G8929 Other chronic pain: Secondary | ICD-10-CM | POA: Diagnosis not present

## 2021-02-16 DIAGNOSIS — F17200 Nicotine dependence, unspecified, uncomplicated: Secondary | ICD-10-CM | POA: Diagnosis not present

## 2021-02-16 DIAGNOSIS — Z6832 Body mass index (BMI) 32.0-32.9, adult: Secondary | ICD-10-CM | POA: Diagnosis not present

## 2021-02-16 DIAGNOSIS — E559 Vitamin D deficiency, unspecified: Secondary | ICD-10-CM | POA: Diagnosis not present

## 2021-02-16 DIAGNOSIS — M545 Low back pain, unspecified: Secondary | ICD-10-CM | POA: Diagnosis not present

## 2021-02-16 DIAGNOSIS — M129 Arthropathy, unspecified: Secondary | ICD-10-CM | POA: Diagnosis not present

## 2021-02-16 DIAGNOSIS — Z1159 Encounter for screening for other viral diseases: Secondary | ICD-10-CM | POA: Diagnosis not present

## 2021-02-16 DIAGNOSIS — Z79899 Other long term (current) drug therapy: Secondary | ICD-10-CM | POA: Diagnosis not present

## 2021-02-16 DIAGNOSIS — F17210 Nicotine dependence, cigarettes, uncomplicated: Secondary | ICD-10-CM | POA: Diagnosis not present

## 2021-02-16 NOTE — Chronic Care Management (AMB) (Signed)
  Chronic Care Management   Note  02/16/2021 Name: Victoria Deleon MRN: [id number] DOB: [birth year]  Victoria Deleon is a 55 y.o. year old female who is a primary care patient of Noreene Larsson, NP. I reached out to Arlyce Dice by phone today in response to a referral sent by Victoria Deleon's PCP, Noreene Larsson, NP      Victoria Deleon was given information about Chronic Care Management services today including:  CCM service includes personalized support from designated clinical staff supervised by her physician, including individualized plan of care and coordination with other care providers 24/7 contact phone numbers for assistance for urgent and routine care needs. Service will only be billed when office clinical staff spend 20 minutes or more in a month to coordinate care. Only one practitioner may furnish and bill the service in a calendar month. The patient may stop CCM services at any time (effective at the end of the month) by phone call to the office staff. The patient will be responsible for cost sharing (co-pay) of up to 20% of the service fee (after annual deductible is met).  Patient agreed to services and verbal consent obtained.   Follow up plan: Face to Face appointment with care management team member scheduled for: 02/26/21  [facility]  Direct Dial: [phone]

## 2021-02-16 NOTE — Chronic Care Management (AMB) (Signed)
  Chronic Care Management   Outreach Note  02/16/2021 Name: Victoria Deleon MRN: [id number] DOB: May 20, 1966  Victoria Deleon is a 55 y.o. year old female who is a primary care patient of Noreene Larsson, NP. I reached out to Arlyce Dice by phone today in response to a referral sent by [name]'s PCP, Noreene Larsson, NP      An unsuccessful telephone outreach was attempted today. The patient was referred to the case management team for assistance with care management and care coordination.   Follow Up Plan: A HIPAA compliant phone message was left for the patient providing contact information and requesting a return call. The care management team will reach out to the patient again over the next 7 days. If patient returns call to provider office, please advise to call [facility] at [phone].  [facility]  Direct Dial: [phone]

## 2021-02-17 ENCOUNTER — Ambulatory Visit (HOSPITAL_COMMUNITY)
Admission: RE | Admit: 2021-02-17 | Discharge: 2021-02-17 | Disposition: A | Discharge status: Home / Self Care | Payer: Medicare HMO | Source: Ambulatory Visit | Attending: Neurosurgery | Admitting: Neurosurgery

## 2021-02-17 ENCOUNTER — Other Ambulatory Visit: Payer: Self-pay

## 2021-02-17 DIAGNOSIS — M5440 Lumbago with sciatica, unspecified side: Secondary | ICD-10-CM | POA: Insufficient documentation

## 2021-02-17 DIAGNOSIS — M5126 Other intervertebral disc displacement, lumbar region: Secondary | ICD-10-CM | POA: Diagnosis not present

## 2021-02-26 ENCOUNTER — Other Ambulatory Visit: Payer: Self-pay

## 2021-02-26 ENCOUNTER — Ambulatory Visit (INDEPENDENT_AMBULATORY_CARE_PROVIDER_SITE_OTHER): Payer: Medicare HMO | Admitting: Pharmacist

## 2021-02-26 DIAGNOSIS — E063 Autoimmune thyroiditis: Secondary | ICD-10-CM

## 2021-02-26 DIAGNOSIS — E782 Mixed hyperlipidemia: Secondary | ICD-10-CM

## 2021-02-26 DIAGNOSIS — F419 Anxiety disorder, unspecified: Secondary | ICD-10-CM

## 2021-02-26 DIAGNOSIS — E038 Other specified hypothyroidism: Secondary | ICD-10-CM

## 2021-02-26 DIAGNOSIS — I10 Essential (primary) hypertension: Secondary | ICD-10-CM

## 2021-02-26 NOTE — Chronic Care Management (AMB) (Signed)
Chronic Care Management Pharmacy Note  02/26/2021 Name:  Victoria Deleon MRN:  [id number] DOB:  [birth year]  Summary: Patient's co-pay for compounded levothyroxine is ~$50 per month since this is not covered by her insurance.  Unsure medication new endocrinology may recommend but patient given patient assistance application for Tirosint and Tirosing-Sol in office today should they pursue that as an option. These medications are not covered under her current insurance plan. A prior authorization would need to be completed.  Patient was instructed to apply for Medicare Low Income Subsidy (Medicare Extra Help). Will follow-up to see if she is approved and give recommendations for next steps. Even if approved for this, will not lower the cost of her compounded levothyroxine so may need to pursue another option.  Lipids will likely improve if able to get thyroid function under better control. May also need to consider additional lipid lowering therapy such as ezetimibe 10 mg by mouth daily in addition to current statin. Patient was instructed to apply for Medicare Low Income Subsidy (Medicare Extra Help). Will follow-up to see if she is approved and give recommendations for next steps. If approved, Linzess cost will be ~$10 per month. If denied, can apply for manufacturer assistance program for Linzess through [place]. Patient given blank patient assistance application for Linzess in office today should she need it to apply.  Subjective: Victoria Deleon is an 55 y.o. year old female who is a primary patient of Noreene Larsson, NP.  The CCM team was consulted for assistance with disease management and care coordination needs.    Engaged with patient face to face for initial visit in response to provider referral for pharmacy case management and/or care coordination services.   Consent to Services:  The patient was given the following information about Chronic Care Management services today, agreed  to services, and gave verbal consent: 1. CCM service includes personalized support from designated clinical staff supervised by the primary care provider, including individualized plan of care and coordination with other care providers 2. 24/7 contact phone numbers for assistance for urgent and routine care needs. 3. Service will only be billed when office clinical staff spend 20 minutes or more in a month to coordinate care. 4. Only one practitioner may furnish and bill the service in a calendar month. 5.The patient may stop CCM services at any time (effective at the end of the month) by phone call to the office staff. 6. The patient will be responsible for cost sharing (co-pay) of up to 20% of the service fee (after annual deductible is met). Patient agreed to services and consent obtained.  Patient Care Team: Noreene Larsson, NP as PCP - General (Nurse Practitioner) Satira Sark, MD as PCP - Cardiology (Cardiology) Gala Romney Cristopher Estimable, MD as Consulting Physician (Gastroenterology) Beryle Lathe, [facility] (Pharmacist)  Objective:  Lab Results  Component Value Date   CREATININE 0.86 10/30/2020   CREATININE 0.97 07/07/2020   CREATININE 0.92 01/25/2020    No results found for: HGBA1C Last diabetic Eye exam: No results found for: HMDIABEYEEXA  Last diabetic Foot exam: No results found for: HMDIABFOOTEX      Component Value Date/Time   CHOL 348 (H) 10/30/2020 0951   TRIG 154 (H) 10/30/2020 0951   HDL 56 10/30/2020 0951   CHOLHDL 6.2 (H) 10/30/2020 0951   LDLCALC 262 (H) 10/30/2020 0951    Hepatic Function Latest Ref Rng & Units 10/30/2020 07/07/2020 01/25/2020  Total Protein 6.0 - 8.5 g/dL  6.7 6.7 6.9  Albumin 3.8 - 4.9 g/dL 5.0(H) 4.5 4.9  AST 0 - 40 IU/L 17 16 18   ALT 0 - 32 IU/L 16 18 16   Alk Phosphatase 44 - 121 IU/L 73 67 92  Total Bilirubin 0.0 - 1.2 mg/dL 0.4 0.3 0.4    Lab Results  Component Value Date/Time   TSH 41.700 (H) 12/31/2020 09:21 AM   TSH 41.200 (H)  11/10/2020 08:42 AM   FREET4 0.59 (L) 12/31/2020 09:21 AM   FREET4 0.60 (L) 11/10/2020 08:42 AM    CBC Latest Ref Rng & Units 10/30/2020 07/07/2020 01/25/2020  WBC 3.4 - 10.8 x10E3/uL 5.9 7.1 5.5  Hemoglobin 11.1 - 15.9 g/dL 15.3 14.9 15.1  Hematocrit 34.0 - 46.6 % 44.9 42.0 43.9  Platelets 150 - 450 x10E3/uL 304 317 291    Lab Results  Component Value Date/Time   VD25OH 29.2 (L) 01/25/2020 10:24 AM    Clinical ASCVD: No  The ASCVD Risk score (Arnett DK, et al., 2019) failed to calculate for the following reasons:   The valid total cholesterol range is 130 to 320 mg/dL    Social History   Tobacco Use  Smoking Status Every Day   Packs/day: 0.25   Years: 30.00   Pack years: 7.50   Types: Cigarettes  Smokeless Tobacco Never   BP Readings from Last 3 Encounters:  02/13/21 132/74  01/28/21 (!) 149/91  01/19/21 127/77   Pulse Readings from Last 3 Encounters:  02/13/21 60  01/28/21 63  01/19/21 (!) 54   Wt Readings from Last 3 Encounters:  02/13/21 169 lb (76.7 kg)  01/28/21 171 lb 3.2 oz (77.7 kg)  01/19/21 169 lb (76.7 kg)    Assessment: Review of patient past medical history, allergies, medications, health status, including review of consultants reports, laboratory and other test data, was performed as part of comprehensive evaluation and provision of chronic care management services.   SDOH:  (Social Determinants of Health) assessments and interventions performed:    CCM Care Plan  Allergies  Allergen Reactions   Codeine Anaphylaxis    Medications Reviewed Today     Reviewed by Beryle Lathe, [facility] (Pharmacist) on 02/26/21 at 1039  Med List Status: <None>   Medication Order Taking? Sig Documenting Provider Last Dose Status Informant  atorvastatin (LIPITOR) 80 MG tablet [id number] Yes Take 1 tablet (80 mg total) by mouth at bedtime. Lindell Spar, MD Taking Active   bisacodyl (DULCOLAX) 10 MG suppository [id number] Yes Place 1 suppository (10 mg total)  rectally as needed for moderate constipation. Noreene Larsson, NP Taking Active   busPIRone (BUSPAR) 7.5 MG tablet [id number] Yes Take 1 tablet (7.5 mg total) by mouth 3 (three) times daily. Perlie Mayo, NP Taking Active   doxepin (SINEQUAN) 25 MG capsule [id number] Yes TAKE 1 CAPSULE BY MOUTHHONCE AT BEDTIME. [provider] Taking Active   DULoxetine (CYMBALTA) 30 MG capsule [id number] Yes Take 2 capsules by mouth daily. [provider] Taking Active   EPINEPHrine 0.3 mg/0.3 mL IJ SOAJ injection [id number]  Inject 0.3 mg into the muscle as needed for anaphylaxis. Lindell Spar, MD  Active   FIBER PO [id number] Yes Take 1 capsule by mouth 2 (two) times daily.  [provider] Taking Active Self  gabapentin (NEURONTIN) 300 MG capsule [id number] Yes Take 300 mg by mouth 3 (three) times daily. [provider] Taking Active   Levothyroxine Sodium 150 MCG/ML SOLN [id number] Yes Take 150 mcg by mouth daily  before breakfast. Brita Romp, NP Taking Active   linaclotide Rolan Lipa) 290 MCG CAPS capsule [id number] Yes Take 1 capsule (290 mcg total) by mouth daily before breakfast. Mahala Menghini, PA-C Taking Active   losartan (COZAAR) 50 MG tablet [id number] Yes Take 1 tablet (50 mg total) by mouth daily. Lindell Spar, MD Taking Active   methocarbamol (ROBAXIN) 500 MG tablet [id number] Yes Take 500 mg by mouth 2 (two) times daily. [provider] Taking Active   pantoprazole (PROTONIX) 40 MG tablet [id number] Yes TAKE 1 TABLET BY MOUTH ONCE DAILY. Noreene Larsson, NP Taking Active   pregabalin (LYRICA) 100 MG capsule [id number] Yes Take 100 mg by mouth 2 (two) times daily. [provider] Taking Active   propranolol (INDERAL) 20 MG tablet [id number] Yes Take 20 mg by mouth 3 (three) times daily as needed for anxiety. [provider] Taking Active   tiZANidine (ZANAFLEX) 2 MG tablet [id number] No Take 1 tablet (2 mg total) by mouth every 6 (six) hours  as needed.  Patient not taking: Reported on 02/26/2021   Leighton Parody, PA-C Not Taking Active   traMADol Veatrice Bourbon) 50 MG tablet [id number] Yes Take 50 mg by mouth daily. [provider] Taking Active   traZODone (DESYREL) 50 MG tablet [id number] Yes Take 50-100 mg by mouth at bedtime as needed. [provider] Taking Active             Patient Active Problem List   Diagnosis Date Noted   Polypharmacy 02/13/2021   Lumbar spondylosis 09/29/2020   Low back pain 02/11/2020   History of recurrent UTIs 23/30/0762   Lichen sclerosus 26/33/3545   Vaginal atrophy 02/11/2020   Anxiety 01/25/2020   PTSD (post-traumatic stress disorder) 01/25/2020   Essential hypertension 01/25/2020   Screening mammogram, encounter for 01/25/2020   Routine cervical smear 01/25/2020   Vitamin D deficiency 01/25/2020   Hyperlipidemia 01/25/2020   Hypothyroidism 01/25/2020   GERD (gastroesophageal reflux disease) 07/27/2018   Constipation 07/27/2018   Crohn's disease ([place]) 02/13/2018    Immunization History  Administered Date(s) Administered   Moderna Sars-Covid-2 Vaccination 11/15/2019, 01/04/2020   Tdap 08/27/2020    Conditions to be addressed/monitored: HTN, HLD, Anxiety, and hypothyroidism  Care Plan : Medication Management  Updates made by Beryle Lathe, [place] since 02/26/2021 12:00 AM     Problem: Hypothyroidism, HTN, HLD, Anxiety   Priority: High  Onset Date: 02/26/2021     Long-Range Goal: Disease Progression Prevention   Start Date: 02/26/2021  Expected End Date: 05/27/2021  This Visit's Progress: On track  Priority: High  Note:   Current Barriers:  Unable to independently afford treatment regimen Unable to achieve control of hyperlipidemia and hypothyroidism  Pharmacist Clinical Goal(s):  Over the next 90 days, patient will Verbalize ability to afford treatment regimen Achieve control of hyperlipidemia and hypothyroidsism as evidenced by improved LDL and  improved TSH and free T4  through collaboration with PharmD and provider.   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 Dorris Fetch and Ms Marinus Maw) Uncontrolled. TSH 41.7, Free T4 0.59 Current medications: compounded levothyroxine solution 150 mcg by mouth daily. Patient reports taking on an empty stomach 1 hour prior to other medications, food, or beverages  Patient plans to get second opinion for treatment at a [place] office Patient appears to have some sort of malabsorptive disorder but this is unclear. Patient is seeing GI and although, patient has a history of Crohn's disease, according to her most recent colonoscopy, she does not have active Crohn's disease.  Patient's co-pay for compounded levothyroxine is ~$50 per month since this is not covered by her insurance.  Unsure medication new endocrinology may recommend but patient given patient assistance application for Tirosint and Tirosing-Sol in office today should they pursue that as an option. These medications are not covered under her current insurance plan. A prior authorization would need to be completed.  Patient was instructed to apply for Medicare Low Income Subsidy (Medicare Extra Help). Will follow-up to see if she is approved and give recommendations for next steps.  Hypertension: Blood pressure under good control. Blood pressure is at goal of <130/80 mmHg per 2017 AHA/ACC guidelines. Current medications: losartan 50 mg by mouth once daily Intolerances: none Taking medications as directed: yes Side effects thought to be attributed to current medication regimen:  no Denies dizziness and lightheadedness. Current exercise: not discussed today Current home blood pressure: patient checks but is unsure of exact numbers; reports that it is usually "normal" Continue losartan 50 mg by mouth once daily Encourage dietary sodium restriction/DASH diet Recommend regular aerobic exercise Recommend home blood pressure monitoring to discuss at next visit Discussed need for medication compliance  Hyperlipidemia: Uncontrolled. LDL above goal of <100 due to moderate risk given <2 major CV risk factors (hypertension) and 10-year risk <10% per 2020 AACE/ACE guidelines. Triglycerides above goal of <150 per 2020 AACE/ACE guidelines. Current medications: atorvastatin 80 mg by mouth once daily Intolerances: none Taking medications as directed: yes Side effects thought to be attributed to current medication regimen: no Patient is a vegetarian and reports that she does not consume high amount of fat Continue atorvastatin 80 mg by mouth once daily Recommend regular aerobic exercise Reviewed risks of hyperlipidemia, principles of treatment and consequences of untreated hyperlipidemia Discussed need for medication compliance Re-check lipid panel in 4-12 weeks Lipids will likely improve if able to get thyroid function under better control. May also need to consider additional lipid lowering therapy such as ezetimibe 10 mg by mouth daily  Anxiety: Controlled per patient  Current medications: buspirone 7.5 mg by mouth three time daily, duloxetine 60 mg by mouth daily, propranolol 20 mg by mouth three time daily as needed Also takes trazodone and doxepin 25 mg at bedtime for insomnia  Continue current medications as above  IBS: Patient currently takes Linzess and this works well for her but her co-pay is expensive.  Patient was instructed to apply for Medicare Low Income Subsidy (Medicare Extra Help). Will follow-up to see if she is approved and give recommendations for next  steps. If approved, the medication cost will be ~$10 per month. If denied, can apply for manufacturer assistance program through [place]. Patient given blank patient assistance application for Linzess in office today should she need it to apply.  Patient Goals/Self-Care Activities Over the next 90 days, patient will:  Take medications as prescribed Collaborate with provider on medication access solutions  Follow Up Plan: Telephone follow up appointment with care management team member scheduled for: 03/17/21      Medication Assistance:  patient to apply for Medicare LIS. Will follow-up on approval/denial  Patient's preferred pharmacy is:  [facility], [address]  [place] [address] Phone: [phone] Fax: [phone]  Follow Up:  Patient agrees to Care Plan and Follow-up.  Plan: Telephone follow up appointment with care management team member scheduled for:  03/17/21  Kennon Holter, PharmD Clinical Pharmacist [facility] [phone]

## 2021-02-26 NOTE — Patient Instructions (Signed)
Victoria Deleon,  It was great to talk to you today!  Please call me with any questions or concerns.   Please apply for Medicare Low Income Subsidy (Medicare Extra Help).   Website: [email or web address] Phone: [phone]  Visit Information   PATIENT GOALS:   Goals Addressed             This Visit's Progress    Medication Management       Patient Goals/Self-Care Activities Over the next 90 days, patient will:  Take medications as prescribed Collaborate with provider on medication access solutions        Consent to CCM Services: Ms. Khader was given information about Chronic Care Management services including:  CCM service includes personalized support from designated clinical staff supervised by her physician, including individualized plan of care and coordination with other care providers 24/7 contact phone numbers for assistance for urgent and routine care needs. Service will only be billed when office clinical staff spend 20 minutes or more in a month to coordinate care. Only one practitioner may furnish and bill the service in a calendar month. The patient may stop CCM services at any time (effective at the end of the month) by phone call to the office staff. The patient will be responsible for cost sharing (co-pay) of up to 20% of the service fee (after annual deductible is met).  Patient agreed to services and verbal consent obtained.   The patient verbalized understanding of instructions, educational materials, and care plan provided today and declined offer to receive copy of patient instructions, educational materials, and care plan.   Telephone follow up appointment with care management team member scheduled for:03/17/21  Kennon Holter, PharmD Clinical Pharmacist [facility] [phone]  CLINICAL CARE PLAN: Patient Care Plan: Medication Management     Problem Identified: Hypothyroidism,  HTN, HLD, Anxiety   Priority: High  Onset Date: 02/26/2021     Long-Range Goal: Disease Progression Prevention   Start Date: 02/26/2021  Expected End Date: 05/27/2021  This Visit's Progress: On track  Priority: High  Note:   Current Barriers:  Unable to independently afford treatment regimen Unable to achieve control of hyperlipidemia and hypothyroidism  Pharmacist Clinical Goal(s):  Over the next 90 days, patient will Verbalize ability to afford treatment regimen Achieve control of hyperlipidemia and hypothyroidsism as evidenced by improved LDL and improved TSH and free T4  through collaboration with PharmD and provider.   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 Dorris Fetch and Ms Marinus Maw) Uncontrolled. TSH 41.7, Free T4 0.59 Current medications: compounded levothyroxine solution 150 mcg by mouth daily. Patient reports taking on an empty stomach 1 hour prior to other medications, food, or beverages Patient plans to get second opinion for treatment at a [place] office Patient appears to have some sort of malabsorptive disorder but this is unclear. Patient is seeing GI and although, patient has a history of Crohn's disease, according to her most recent colonoscopy, she does not have active Crohn's disease.  Patient's co-pay for compounded levothyroxine is ~$50 per month since this is not covered by her insurance.  Unsure medication new endocrinology may recommend but patient given patient assistance application for Tirosint and Tirosing-Sol in office today should they pursue that as  an option. These medications are not  covered under her current insurance plan. A prior authorization would need to be completed.  Patient was instructed to apply for Medicare Low Income Subsidy (Medicare Extra Help). Will follow-up to see if she is approved and give recommendations for next steps.  Hypertension: Blood pressure under good control. Blood pressure is at goal of <130/80 mmHg per 2017 AHA/ACC guidelines. Current medications: losartan 50 mg by mouth once daily Intolerances: none Taking medications as directed: yes Side effects thought to be attributed to current medication regimen: no Denies dizziness and lightheadedness. Current exercise: not discussed today Current home blood pressure: patient checks but is unsure of exact numbers; reports that it is usually "normal" Continue losartan 50 mg by mouth once daily Encourage dietary sodium restriction/DASH diet Recommend regular aerobic exercise Recommend home blood pressure monitoring to discuss at next visit Discussed need for medication compliance  Hyperlipidemia: Uncontrolled. LDL above goal of <100 due to moderate risk given <2 major CV risk factors (hypertension) and 10-year risk <10% per 2020 AACE/ACE guidelines. Triglycerides above goal of <150 per 2020 AACE/ACE guidelines. Current medications: atorvastatin 80 mg by mouth once daily Intolerances: none Taking medications as directed: yes Side effects thought to be attributed to current medication regimen: no Patient is a vegetarian and reports that she does not consume high amount of fat Continue atorvastatin 80 mg by mouth once daily Recommend regular aerobic exercise Reviewed risks of hyperlipidemia, principles of treatment and consequences of untreated hyperlipidemia Discussed need for medication compliance Re-check lipid panel in 4-12 weeks Lipids will likely improve if able to get thyroid function under better control. May also need to consider additional lipid lowering therapy such as ezetimibe 10 mg  by mouth daily  Anxiety: Controlled per patient  Current medications: buspirone 7.5 mg by mouth three time daily, duloxetine 60 mg by mouth daily, propranolol 20 mg by mouth three time daily as needed Also takes trazodone and doxepin 25 mg at bedtime for insomnia  Continue current medications as above  IBS: Patient currently takes Linzess and this works well for her but her co-pay is expensive.  Patient was instructed to apply for Medicare Low Income Subsidy (Medicare Extra Help). Will follow-up to see if she is approved and give recommendations for next steps. If approved, the medication cost will be ~$10 per month. If denied, can apply for manufacturer assistance program through [place]. Patient given blank patient assistance application for Linzess in office today should she need it to apply.  Patient Goals/Self-Care Activities Over the next 90 days, patient will:  Take medications as prescribed Collaborate with provider on medication access solutions  Follow Up Plan: Telephone follow up appointment with care management team member scheduled for: 03/17/21

## 2021-02-27 DIAGNOSIS — I10 Essential (primary) hypertension: Secondary | ICD-10-CM | POA: Diagnosis not present

## 2021-02-27 DIAGNOSIS — E063 Autoimmune thyroiditis: Secondary | ICD-10-CM | POA: Diagnosis not present

## 2021-02-27 DIAGNOSIS — E782 Mixed hyperlipidemia: Secondary | ICD-10-CM | POA: Diagnosis not present

## 2021-02-27 DIAGNOSIS — E038 Other specified hypothyroidism: Secondary | ICD-10-CM

## 2021-03-02 ENCOUNTER — Ambulatory Visit (INDEPENDENT_AMBULATORY_CARE_PROVIDER_SITE_OTHER): Payer: Medicare HMO | Admitting: Nurse Practitioner

## 2021-03-02 ENCOUNTER — Other Ambulatory Visit: Payer: Self-pay

## 2021-03-02 ENCOUNTER — Encounter: Payer: Self-pay | Admitting: Nurse Practitioner

## 2021-03-02 VITALS — BP 143/87 | HR 61 | Temp 98.2°F | Ht 61.0 in | Wt 173.0 lb

## 2021-03-02 DIAGNOSIS — I10 Essential (primary) hypertension: Secondary | ICD-10-CM | POA: Diagnosis not present

## 2021-03-02 DIAGNOSIS — E785 Hyperlipidemia, unspecified: Secondary | ICD-10-CM

## 2021-03-02 DIAGNOSIS — E038 Other specified hypothyroidism: Secondary | ICD-10-CM | POA: Diagnosis not present

## 2021-03-02 DIAGNOSIS — Z6832 Body mass index (BMI) 32.0-32.9, adult: Secondary | ICD-10-CM | POA: Diagnosis not present

## 2021-03-02 DIAGNOSIS — R7301 Impaired fasting glucose: Secondary | ICD-10-CM | POA: Diagnosis not present

## 2021-03-02 DIAGNOSIS — G894 Chronic pain syndrome: Secondary | ICD-10-CM | POA: Diagnosis not present

## 2021-03-02 DIAGNOSIS — G8929 Other chronic pain: Secondary | ICD-10-CM | POA: Diagnosis not present

## 2021-03-02 DIAGNOSIS — F17200 Nicotine dependence, unspecified, uncomplicated: Secondary | ICD-10-CM | POA: Diagnosis not present

## 2021-03-02 DIAGNOSIS — E063 Autoimmune thyroiditis: Secondary | ICD-10-CM

## 2021-03-02 DIAGNOSIS — M545 Low back pain, unspecified: Secondary | ICD-10-CM | POA: Diagnosis not present

## 2021-03-02 DIAGNOSIS — F17210 Nicotine dependence, cigarettes, uncomplicated: Secondary | ICD-10-CM | POA: Diagnosis not present

## 2021-03-02 DIAGNOSIS — R030 Elevated blood-pressure reading, without diagnosis of hypertension: Secondary | ICD-10-CM | POA: Diagnosis not present

## 2021-03-02 NOTE — Assessment & Plan Note (Signed)
-  referral to endo per pt request; she would like non-compounded levothyroxine d/t costs

## 2021-03-02 NOTE — Assessment & Plan Note (Signed)
-  checking lipids today

## 2021-03-02 NOTE — Assessment & Plan Note (Signed)
BP Readings from Last 3 Encounters:  03/02/21 (!) 143/87  02/13/21 132/74  01/28/21 (!) 149/91   -BP slightly elevated; no med changes

## 2021-03-02 NOTE — Progress Notes (Signed)
Acute Office Visit  Subjective:    Patient ID: Victoria Deleon, female    DOB: 08-15-1965, 55 y.o.   MRN: [id number]  Chief Complaint  Patient presents with   Follow-up    4 month follow up needs endo provider the one we referred to isn't taking new patients    HPI Patient is in today for f/u for HLD and HTN. She is trying to get a 2nd opinion from endocrinology d/t expensive thyroid medication. No adverse med effects reported.  She states that the endocrinologist that we referred her too is not accepting new patients. 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 Gala Romney: Normal terminal ileum with negative biopsies, normal-appearing colon with random colon biopsies negative, internal hemorrhoids.  Next colonoscopy 10 years.   ESOPHAGOGASTRODUODENOSCOPY (EGD) WITH PROPOFOL N/A 04/24/2018   Dr. Gala Romney: Severe ulcerative reflux esophagitis, small hiatal hernia, small bowel biopsies negative for celiac.   EYE SURGERY N/A    Phreesia 03/04/2020   JOINT REPLACEMENT N/A    Phreesia 01/22/2020   MEDIAL PARTIAL KNEE REPLACEMENT Left    TOTAL KNEE REVISION Left 09/10/2019   Procedure:  LEFT TOTAL KNEE REVISION;  Surgeon: Frederik Pear, MD;  Location: [facility];  Service: Orthopedics;  Laterality: Left;    Family History  Adopted: Yes  Problem Relation Age of Onset   Colon cancer Neg Hx     Social History   Socioeconomic History   Marital status: Married    Spouse name: Fritz Pickerel    Number of children: Not on file   Years of education: Not on file   Highest education level: Not on file  Occupational History    Comment: medical disability   Tobacco Use   Smoking status: Every Day    Packs/day: 0.25    Years: 30.00    Pack years: 7.50    Types: Cigarettes   Smokeless tobacco: Never  Vaping Use   Vaping Use: Never used  Substance and Sexual Activity   Alcohol use: Yes    Comment: rare-maybe 2 glasses of wine/year   Drug use: Never   Sexual activity: Not Currently    Birth control/protection: Surgical    Comment: hyst  Other Topics Concern   Not on file  Social History Narrative   Lives with husband Fritz Pickerel          Enjoys: kayaking, outside events       Diet: eats all food groups outside meat    Caffeine: tea 2 cups daily    Water: 5-6 cups daily or more  Wears seat belt   Does not use phone while driving    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, fast food safe area   Social Determinants of Health   Financial Resource Strain: High Risk   Difficulty of Paying Living Expenses: Hard  Food Insecurity: Food Insecurity Present   Worried About Charity fundraiser in the Last Year: Sometimes true   Arboriculturist in the Last Year: Sometimes true  Transportation Needs: No Transportation Needs   Lack of Transportation (Medical): No   Lack of Transportation (Non-Medical): No  Physical Activity: Inactive   Days of Exercise per Week: 0 days   Minutes of Exercise per Session: 0 min  Stress: Stress Concern Present   Feeling of Stress : To some extent  Social Connections: Moderately Isolated   Frequency of Communication with Friends and Family: Once a  week   Frequency of Social Gatherings with Friends and Family: More than three times a week   Attends Religious Services: Never   Marine scientist or Organizations: No   Attends Music therapist: Never   Marital Status: Married  Human resources officer Violence: Not At Risk   Fear of Current or Ex-Partner: No   Emotionally Abused: No   Physically Abused: No   Sexually Abused: No    Outpatient Medications Prior to Visit  Medication Sig Dispense Refill   atorvastatin (LIPITOR) 80 MG tablet Take 1 tablet (80 mg total) by mouth at bedtime. 90 tablet 1   bisacodyl (DULCOLAX) 10 MG suppository Place 1 suppository (10 mg total) rectally as needed for moderate constipation. 12 suppository 0   busPIRone (BUSPAR) 7.5 MG tablet Take 1 tablet (7.5 mg total) by mouth 3 (three) times daily. 90 tablet 1   doxepin (SINEQUAN) 25 MG capsule TAKE 1 CAPSULE BY MOUTHHONCE AT BEDTIME.     DULoxetine (CYMBALTA) 30 MG capsule Take 2 capsules by mouth daily.     EPINEPHrine 0.3 mg/0.3 mL IJ SOAJ injection Inject 0.3 mg into the muscle as needed for anaphylaxis. 1 each 2   FIBER PO Take 1 capsule by mouth 2 (two) times daily.      gabapentin (NEURONTIN) 300 MG capsule Take 300 mg by mouth 3 (three) times daily.     Levothyroxine Sodium 150 MCG/ML SOLN Take 150 mcg by mouth daily before breakfast. 90 mL 3   linaclotide (LINZESS) 290 MCG CAPS capsule Take 1 capsule (290 mcg total) by mouth daily before breakfast. 30 capsule 5   losartan (COZAAR) 50 MG tablet Take 1 tablet (50 mg total) by mouth daily. 90 tablet 0   methocarbamol (ROBAXIN) 500 MG tablet Take 500 mg by mouth 2 (two) times daily.     pantoprazole (PROTONIX) 40 MG tablet TAKE 1 TABLET BY MOUTH ONCE DAILY. 30 tablet 0   pregabalin (LYRICA) 100 MG capsule Take 100 mg by mouth 2 (two) times daily.     propranolol (INDERAL) 20 MG tablet Take 20 mg by mouth 3 (three) times daily as needed for anxiety.     tiZANidine (ZANAFLEX) 2 MG tablet Take  1 tablet (2 mg total) by mouth every 6 (six) hours as needed. 60 tablet 0   traMADol (ULTRAM) 50 MG tablet Take 50 mg by mouth daily.     traZODone (DESYREL) 50 MG tablet Take 50-100 mg by mouth at bedtime as needed.     No facility-administered medications prior to visit.    Allergies  Allergen Reactions  Codeine Anaphylaxis    Review of Systems  Constitutional: Negative.   Respiratory: Negative.    Cardiovascular: Negative.   Musculoskeletal: Negative.   Psychiatric/Behavioral: Negative.        Objective:    Physical Exam Constitutional:      Appearance: Normal appearance.  Cardiovascular:     Rate and Rhythm: Normal rate and regular rhythm.     Pulses: Normal pulses.     Heart sounds: Normal heart sounds.  Pulmonary:     Effort: Pulmonary effort is normal.     Breath sounds: Normal breath sounds.  Musculoskeletal:        General: Normal range of motion.  Neurological:     Mental Status: She is alert.  Psychiatric:        Mood and Affect: Mood normal.        Behavior: Behavior normal.        Thought Content: Thought content normal.        Judgment: Judgment normal.    BP (!) 143/87 (BP Location: Right Arm, Patient Position: Sitting, Cuff Size: Large)   Pulse 61   Temp 98.2 F (36.8 C) (Oral)   Ht 5' 1"  (1.549 m)   Wt 173 lb 0.6 oz (78.5 kg)   SpO2 94%   BMI 32.70 kg/m  Wt Readings from Last 3 Encounters:  03/02/21 173 lb 0.6 oz (78.5 kg)  02/13/21 169 lb (76.7 kg)  01/28/21 171 lb 3.2 oz (77.7 kg)    Health Maintenance Due  Topic Date Due   HIV Screening  Never done   Hepatitis C Screening  Never done   Zoster Vaccines- Shingrix (1 of 2) Never done    There are no preventive care reminders to display for this patient.   Lab Results  Component Value Date   TSH 41.700 (H) 12/31/2020   Lab Results  Component Value Date   WBC 5.9 10/30/2020   HGB 15.3 10/30/2020   HCT 44.9 10/30/2020   MCV 90 10/30/2020   PLT 304 10/30/2020   Lab Results   Component Value Date   NA 138 10/30/2020   K 4.3 10/30/2020   CO2 22 10/30/2020   GLUCOSE 100 (H) 10/30/2020   BUN 7 10/30/2020   CREATININE 0.86 10/30/2020   BILITOT 0.4 10/30/2020   ALKPHOS 73 10/30/2020   AST 17 10/30/2020   ALT 16 10/30/2020   PROT 6.7 10/30/2020   ALBUMIN 5.0 (H) 10/30/2020   CALCIUM 9.7 10/30/2020   ANIONGAP 9 08/30/2019   EGFR 80 10/30/2020   Lab Results  Component Value Date   CHOL 348 (H) 10/30/2020   Lab Results  Component Value Date   HDL 56 10/30/2020   Lab Results  Component Value Date   LDLCALC 262 (H) 10/30/2020   Lab Results  Component Value Date   TRIG 154 (H) 10/30/2020   Lab Results  Component Value Date   CHOLHDL 6.2 (H) 10/30/2020   No results found for: HGBA1C     Assessment & Plan:   Problem List Items Addressed This Visit       Cardiovascular and Mediastinum   Essential hypertension    BP Readings from Last 3 Encounters:  03/02/21 (!) 143/87  02/13/21 132/74  01/28/21 (!) 149/91  -BP slightly elevated; no med changes      Relevant Orders   CMP14+EGFR   CBC with Differential/Platelet   Lipid Panel With LDL/HDL Ratio     Endocrine   Hypothyroidism    -referral  to endo per pt request; she would like non-compounded levothyroxine d/t costs      Relevant Orders   Ambulatory referral to Endocrinology     Other   Hyperlipidemia - Primary    -checking lipids today      Relevant Orders   CMP14+EGFR   CBC with Differential/Platelet   Lipid Panel With LDL/HDL Ratio     No orders of the defined types were placed in this encounter.    Noreene Larsson, NP

## 2021-03-02 NOTE — Patient Instructions (Signed)
Please have fasting labs drawn today. 

## 2021-03-03 LAB — CBC WITH DIFFERENTIAL/PLATELET
Basophils Absolute: 0.1 10*3/uL (ref 0.0–0.2)
Basos: 1 %
EOS (ABSOLUTE): 0.3 10*3/uL (ref 0.0–0.4)
Eos: 6 %
Hematocrit: 42.3 % (ref 34.0–46.6)
Hemoglobin: 14.6 g/dL (ref 11.1–15.9)
Immature Grans (Abs): 0 10*3/uL (ref 0.0–0.1)
Immature Granulocytes: 0 %
Lymphocytes Absolute: 1.4 10*3/uL (ref 0.7–3.1)
Lymphs: 29 %
MCH: 30.9 pg (ref 26.6–33.0)
MCHC: 34.5 g/dL (ref 31.5–35.7)
MCV: 90 fL (ref 79–97)
Monocytes Absolute: 0.5 10*3/uL (ref 0.1–0.9)
Monocytes: 10 %
Neutrophils Absolute: 2.7 10*3/uL (ref 1.4–7.0)
Neutrophils: 54 %
Platelets: 253 10*3/uL (ref 150–450)
RBC: 4.72 x10E6/uL (ref 3.77–5.28)
RDW: 13 % (ref 11.7–15.4)
WBC: 5 10*3/uL (ref 3.4–10.8)

## 2021-03-03 LAB — LIPID PANEL WITH LDL/HDL RATIO
Cholesterol, Total: 344 mg/dL — ABNORMAL HIGH (ref 100–199)
HDL: 62 mg/dL (ref >39)
LDL Chol Calc (NIH): 251 mg/dL — ABNORMAL HIGH (ref 0–99)
LDL/HDL Ratio: 4 ratio — ABNORMAL HIGH (ref 0.0–3.2)
Triglycerides: 161 mg/dL — ABNORMAL HIGH (ref 0–149)
VLDL Cholesterol Cal: 31 mg/dL (ref 5–40)

## 2021-03-03 LAB — CMP14+EGFR
ALT: 11 IU/L (ref 0–32)
AST: 14 IU/L (ref 0–40)
Albumin/Globulin Ratio: 2.2 (ref 1.2–2.2)
Albumin: 4.6 g/dL (ref 3.8–4.9)
Alkaline Phosphatase: 68 IU/L (ref 44–121)
BUN/Creatinine Ratio: 14 (ref 9–23)
BUN: 12 mg/dL (ref 6–24)
Bilirubin Total: 0.4 mg/dL (ref 0.0–1.2)
CO2: 24 mmol/L (ref 20–29)
Calcium: 9.5 mg/dL (ref 8.7–10.2)
Chloride: 100 mmol/L (ref 96–106)
Creatinine, Ser: 0.85 mg/dL (ref 0.57–1.00)
Globulin, Total: 2.1 g/dL (ref 1.5–4.5)
Glucose: 110 mg/dL — ABNORMAL HIGH (ref 70–99)
Potassium: 4.6 mmol/L (ref 3.5–5.2)
Sodium: 139 mmol/L (ref 134–144)
Total Protein: 6.7 g/dL (ref 6.0–8.5)
eGFR: 81 mL/min/{1.73_m2} (ref >59)

## 2021-03-03 LAB — T4, FREE: Free T4: 0.7 ng/dL — ABNORMAL LOW (ref 0.82–1.77)

## 2021-03-03 LAB — TSH: TSH: 23.3 u[IU]/mL — ABNORMAL HIGH (ref 0.450–4.500)

## 2021-03-03 NOTE — Progress Notes (Signed)
LDL is still significantly elevated. Please see the pharmacist on 03/17/21.

## 2021-03-05 LAB — HEMOGLOBIN A1C
Est. average glucose Bld gHb Est-mCnc: 126 mg/dL
Hgb A1c MFr Bld: 6 % — ABNORMAL HIGH (ref 4.8–5.6)

## 2021-03-05 LAB — SPECIMEN STATUS REPORT

## 2021-03-05 NOTE — Progress Notes (Signed)
A1c is great at 6.0!

## 2021-03-12 ENCOUNTER — Ambulatory Visit: Payer: Medicare HMO | Admitting: Nurse Practitioner

## 2021-03-12 ENCOUNTER — Ambulatory Visit: Payer: Medicare HMO | Admitting: Family Medicine

## 2021-03-13 ENCOUNTER — Other Ambulatory Visit: Payer: Self-pay | Admitting: Nurse Practitioner

## 2021-03-17 ENCOUNTER — Ambulatory Visit (INDEPENDENT_AMBULATORY_CARE_PROVIDER_SITE_OTHER): Payer: Medicare HMO | Admitting: Pharmacist

## 2021-03-17 DIAGNOSIS — I10 Essential (primary) hypertension: Secondary | ICD-10-CM

## 2021-03-17 DIAGNOSIS — E785 Hyperlipidemia, unspecified: Secondary | ICD-10-CM

## 2021-03-17 DIAGNOSIS — E038 Other specified hypothyroidism: Secondary | ICD-10-CM

## 2021-03-17 DIAGNOSIS — F419 Anxiety disorder, unspecified: Secondary | ICD-10-CM

## 2021-03-17 NOTE — Chronic Care Management (AMB) (Signed)
Chronic Care Management Pharmacy Note  03/17/2021 Name:  Victoria Deleon MRN:  [id number] DOB:  May 31, 1966  Summary:  Hypothyroidism (autoimmune Hashimotos) - followed by endocrinology (Dr. Dorris Fetch and Ms Marinus Maw) Plans to see Dr. Loanne Drilling for second opinion soon. Uncontrolled. TSH 41.7, Free T4 0.59 Patient is in the process of applying for Medicare Low Income Subsidy (Medicare Extra Help). Will follow-up to see if she is approved and give recommendations for next steps.  Hyperlipidemia: Lipids will likely improve if able to get thyroid function under better control. May also need to consider additional lipid lowering therapy such as PCSK9i. Told patient to discuss with endocrinology at next visit.   Subjective: Victoria Deleon is an 55 y.o. year old female who is a primary patient of Noreene Larsson, NP.  The CCM team was consulted for assistance with disease management and care coordination needs.    Engaged with patient by telephone for follow up visit in response to provider referral for pharmacy case management and/or care coordination services.   Consent to Services:  The patient was given information about Chronic Care Management services, agreed to services, and gave verbal consent prior to initiation of services.  Please see initial visit note for detailed documentation.   Patient Care Team: Noreene Larsson, NP as PCP - General (Nurse Practitioner) Satira Sark, MD as PCP - Cardiology (Cardiology) Gala Romney Cristopher Estimable, MD as Consulting Physician (Gastroenterology) Beryle Lathe, [facility] (Pharmacist)  Objective:  Lab Results  Component Value Date   CREATININE 0.85 03/02/2021   CREATININE 0.86 10/30/2020   CREATININE 0.97 07/07/2020    Lab Results  Component Value Date   HGBA1C 6.0 (H) 03/02/2021   Last diabetic Eye exam: No results found for: HMDIABEYEEXA  Last diabetic Foot exam: No results found for: HMDIABFOOTEX      Component Value Date/Time   CHOL  344 (H) 03/02/2021 0857   TRIG 161 (H) 03/02/2021 0857   HDL 62 03/02/2021 0857   CHOLHDL 6.2 (H) 10/30/2020 0951   LDLCALC 251 (H) 03/02/2021 0857    Hepatic Function Latest Ref Rng & Units 03/02/2021 10/30/2020 07/07/2020  Total Protein 6.0 - 8.5 g/dL 6.7 6.7 6.7  Albumin 3.8 - 4.9 g/dL 4.6 5.0(H) 4.5  AST 0 - 40 IU/L _0 ALT 0 - 32 IU/L _1 Alk Phosphatase 44 - 121 IU/L 68 73 67  Total Bilirubin 0.0 - 1.2 mg/dL 0.4 0.4 0.3    Lab Results  Component Value Date/Time   TSH 23.300 (H) 03/02/2021 08:58 AM   TSH 41.700 (H) 12/31/2020 09:21 AM   FREET4 0.70 (L) 03/02/2021 08:58 AM   FREET4 0.59 (L) 12/31/2020 09:21 AM    CBC Latest Ref Rng & Units 03/02/2021 10/30/2020 07/07/2020  WBC 3.4 - 10.8 x10E3/uL 5.0 5.9 7.1  Hemoglobin 11.1 - 15.9 g/dL 14.6 15.3 14.9  Hematocrit 34.0 - 46.6 % 42.3 44.9 42.0  Platelets 150 - 450 x10E3/uL 253 304 317    Lab Results  Component Value Date/Time   VD25OH 29.2 (L) 01/25/2020 10:24 AM    Clinical ASCVD: No  The ASCVD Risk score (Arnett DK, et al., 2019) failed to calculate for the following reasons:   The valid total cholesterol range is 130 to 320 mg/dL    Social History   Tobacco Use  Smoking Status Every Day   Packs/day: 0.25   Years: 30.00   Pack years: 7.50   Types: Cigarettes  Smokeless Tobacco  Never   BP Readings from Last 3 Encounters:  03/02/21 (!) 143/87  02/13/21 132/74  01/28/21 (!) 149/91   Pulse Readings from Last 3 Encounters:  03/02/21 61  02/13/21 60  01/28/21 63   Wt Readings from Last 3 Encounters:  03/02/21 173 lb 0.6 oz (78.5 kg)  02/13/21 169 lb (76.7 kg)  01/28/21 171 lb 3.2 oz (77.7 kg)    Assessment: Review of patient past medical history, allergies, medications, health status, including review of consultants reports, laboratory and other test data, was performed as part of comprehensive evaluation and provision of chronic care management services.   SDOH:  (Social Determinants of Health)  assessments and interventions performed:    CCM Care Plan  Allergies  Allergen Reactions   Codeine Anaphylaxis    Medications Reviewed Today     Reviewed by Beryle Lathe, [facility] (Pharmacist) on 03/17/21 at 1403  Med List Status: <None>   Medication Order Taking? Sig Documenting Provider Last Dose Status Informant  atorvastatin (LIPITOR) 80 MG tablet [id number] Yes Take 1 tablet (80 mg total) by mouth at bedtime. Lindell Spar, MD Taking Active   bisacodyl (DULCOLAX) 10 MG suppository [id number] Yes Place 1 suppository (10 mg total) rectally as needed for moderate constipation. Noreene Larsson, NP Taking Active   busPIRone (BUSPAR) 7.5 MG tablet [id number] Yes Take 1 tablet (7.5 mg total) by mouth 3 (three) times daily. Perlie Mayo, NP Taking Active   doxepin (SINEQUAN) 25 MG capsule [id number] Yes TAKE 1 CAPSULE BY MOUTHHONCE AT BEDTIME. [provider] Taking Active   DULoxetine (CYMBALTA) 30 MG capsule [id number] Yes Take 2 capsules by mouth daily. [provider] Taking Active   EPINEPHrine 0.3 mg/0.3 mL IJ SOAJ injection [id number]  Inject 0.3 mg into the muscle as needed for anaphylaxis. Lindell Spar, MD  Active   FIBER PO [id number] Yes Take 1 capsule by mouth 2 (two) times daily.  [provider] Taking Active Self  gabapentin (NEURONTIN) 300 MG capsule [id number] Yes Take 300 mg by mouth 3 (three) times daily. [provider] Taking Active   Levothyroxine Sodium 150 MCG/ML SOLN [id number] Yes Take 150 mcg by mouth daily before breakfast. Brita Romp, NP Taking Active   linaclotide Rolan Lipa) 290 MCG CAPS capsule [id number] Yes Take 1 capsule (290 mcg total) by mouth daily before breakfast. Mahala Menghini, PA-C Taking Active   losartan (COZAAR) 50 MG tablet [id number] Yes Take 1 tablet (50 mg total) by mouth daily. Lindell Spar, MD Taking Active   methocarbamol (ROBAXIN) 500 MG tablet [id number] Yes Take 500 mg by mouth 2 (two) times  daily. [provider] Taking Active   pantoprazole (PROTONIX) 40 MG tablet [id number] Yes TAKE 1 TABLET BY MOUTH ONCE DAILY. Noreene Larsson, NP Taking Active   pregabalin (LYRICA) 100 MG capsule [id number] Yes Take 100 mg by mouth 2 (two) times daily. [provider] Taking Active   propranolol (INDERAL) 20 MG tablet [id number] Yes Take 20 mg by mouth 3 (three) times daily as needed for anxiety. [provider] Taking Active   sertraline (ZOLOFT) 50 MG tablet [id number] Yes Take 50 mg by mouth daily. [provider] Taking Active   tiZANidine (ZANAFLEX) 2 MG tablet [id number] Yes Take 1 tablet (2 mg total) by mouth every 6 (six) hours as needed. Leighton Parody, PA-C Taking Active   traMADol Veatrice Bourbon) 50 MG tablet [id number] Yes Take 50 mg by mouth daily. [provider] Taking Active  traZODone (DESYREL) 50 MG tablet [id number] Yes Take 50-100 mg by mouth at bedtime as needed. [provider] Taking Active             Patient Active Problem List   Diagnosis Date Noted   Polypharmacy 02/13/2021   Lumbar spondylosis 09/29/2020   Low back pain 02/11/2020   History of recurrent UTIs 68/07/2120   Lichen sclerosus 48/25/0037   Vaginal atrophy 02/11/2020   Anxiety 01/25/2020   PTSD (post-traumatic stress disorder) 01/25/2020   Essential hypertension 01/25/2020   Screening mammogram, encounter for 01/25/2020   Routine cervical smear 01/25/2020   Vitamin D deficiency 01/25/2020   Hyperlipidemia 01/25/2020   Hypothyroidism 01/25/2020   GERD (gastroesophageal reflux disease) 07/27/2018   Constipation 07/27/2018   Crohn's disease ([place]) 02/13/2018    Immunization History  Administered Date(s) Administered   Moderna Sars-Covid-2 Vaccination 11/15/2019, 01/04/2020   Tdap 08/27/2020    Conditions to be addressed/monitored: HTN, HLD, Anxiety, and Hypothyroidism  Care Plan : Medication Management  Updates made by Beryle Lathe,  [place] since 03/17/2021 12:00 AM     Problem: Hypothyroidism, HTN, HLD, Anxiety   Priority: High  Onset Date: 02/26/2021     Long-Range Goal: Disease Progression Prevention   Start Date: 02/26/2021  Expected End Date: 05/27/2021  Recent Progress: On track  Priority: High  Note:   Current Barriers:  Unable to independently afford treatment regimen Unable to achieve control of hyperlipidemia and hypothyroidism  Pharmacist Clinical Goal(s):  Over the next 90 days, patient will Verbalize ability to afford treatment regimen Achieve control of hyperlipidemia and hypothyroidsism as evidenced by improved LDL and improved TSH and free T4  through collaboration with PharmD and provider.   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 Dorris Fetch and Ms Marinus Maw) Plans to see Dr. Loanne Drilling for second opinion soon. Uncontrolled. TSH 41.7, Free T4 0.59 Current medications: compounded levothyroxine solution 150 mcg by mouth daily. Patient reports taking on an empty stomach 1 hour prior to other medications, food, or beverages Patient appears to have some sort of malabsorptive disorder but this is unclear. Patient is seeing GI and although, patient has a history of Crohn's disease, according to her most recent colonoscopy, she does not have active Crohn's disease.  Patient's co-pay for compounded levothyroxine is ~$50 per month since this is not covered by her insurance.  Patient is in the process of applying for Medicare Low Income Subsidy (Medicare Extra Help). Will follow-up to see if she is approved and give recommendations for next  steps.  Hypertension: Blood pressure under good control. Blood pressure is at goal of <130/80 mmHg per 2017 AHA/ACC guidelines. Current medications: losartan 50 mg by mouth once daily Intolerances: none Taking medications as directed: yes Side effects thought to be attributed to current medication regimen: no Denies dizziness and lightheadedness. Current exercise: not discussed today Current home blood pressure: patient checks but is unsure of exact numbers; reports that it is usually "normal" Continue losartan 50 mg by mouth once daily Encourage dietary sodium restriction/DASH diet Recommend regular aerobic exercise Recommend home blood pressure monitoring to discuss at next visit Discussed need for medication compliance  Hyperlipidemia: Uncontrolled. LDL above goal of <100 due to moderate risk given <2 major  CV risk factors (hypertension) and 10-year risk <10% per 2020 AACE/ACE guidelines. Triglycerides above goal of <150 per 2020 AACE/ACE guidelines. Current medications: atorvastatin 80 mg by mouth once daily Intolerances: none Taking medications as directed: yes Side effects thought to be attributed to current medication regimen: no Patient is a vegetarian and reports that she does not consume high amount of fat. Occasionally will eat salmon.  Continue atorvastatin 80 mg by mouth once daily Recommend regular aerobic exercise Reviewed risks of hyperlipidemia, principles of treatment and consequences of untreated hyperlipidemia Discussed need for medication compliance Re-check lipid panel in 4-12 weeks Lipids will likely improve if able to get thyroid function under better control. May also need to consider additional lipid lowering therapy such as PCSK9i. Told patient to discuss with endocrinology at next visit.   Anxiety (Managed by [facility]): Controlled per patient  Current medications: buspirone 7.5 mg by mouth three time daily, duloxetine 60 mg by mouth daily,  sertraline 50 mg by mouth daily, propranolol 20 mg by mouth three time daily as needed Also takes trazodone and doxepin 25 mg at bedtime for insomnia and gabapentin and pregabalin Patient appears to have multiple duplications of medications. Confirmed with patient that this was intentional by her outside provider and she confirmed. Unsure of medical necessity.   IBS: Patient currently takes Linzess and this works well for her but her co-pay is expensive.  Patient was instructed to apply for Medicare Low Income Subsidy (Medicare Extra Help). Will follow-up to see if she is approved and give recommendations for next steps. If approved, the medication cost will be ~$10 per month. If denied, can apply for manufacturer assistance program through [facility]. Patient given blank patient assistance application for Linzess in office today should she need it to apply.  Patient Goals/Self-Care Activities Over the next 90 days, patient will:  Take medications as prescribed Collaborate with provider on medication access solutions  Follow Up Plan: Telephone follow up appointment with care management team member scheduled for: 04/09/21      Medication Assistance:  Medicare LIS in process  Patient's preferred pharmacy is:  [facility] [place] [address] [place] [address] Phone: [phone] Fax: [phone]  Follow Up:  Patient agrees to Care Plan and Follow-up.  Plan: Telephone follow up appointment with care management team member scheduled for:  04/09/21  Kennon Holter, PharmD Clinical Pharmacist [facility] [phone]

## 2021-03-17 NOTE — Patient Instructions (Signed)
Victoria Deleon,  It was great to talk to you today!  Please call me with any questions or concerns.   Visit Information  PATIENT GOALS:  Goals Addressed             This Visit's Progress    Medication Management       Patient Goals/Self-Care Activities Over the next 90 days, patient will:  Take medications as prescribed Collaborate with provider on medication access solutions          The patient verbalized understanding of instructions, educational materials, and care plan provided today and declined offer to receive copy of patient instructions, educational materials, and care plan.   Telephone follow up appointment with care management team member scheduled for:04/09/21  Kennon Holter, PharmD Clinical Pharmacist [facility] [phone]

## 2021-03-23 ENCOUNTER — Telehealth: Payer: Self-pay

## 2021-03-23 ENCOUNTER — Other Ambulatory Visit: Payer: Self-pay | Admitting: *Deleted

## 2021-03-23 DIAGNOSIS — E038 Other specified hypothyroidism: Secondary | ICD-10-CM

## 2021-03-23 NOTE — Telephone Encounter (Signed)
Patient called, asked if you would give her a call [phone]

## 2021-03-24 DIAGNOSIS — M5440 Lumbago with sciatica, unspecified side: Secondary | ICD-10-CM | POA: Diagnosis not present

## 2021-03-25 ENCOUNTER — Other Ambulatory Visit: Payer: Self-pay | Admitting: *Deleted

## 2021-03-25 DIAGNOSIS — E038 Other specified hypothyroidism: Secondary | ICD-10-CM

## 2021-03-26 ENCOUNTER — Ambulatory Visit: Payer: Medicare HMO | Admitting: Pharmacist

## 2021-03-26 DIAGNOSIS — F419 Anxiety disorder, unspecified: Secondary | ICD-10-CM

## 2021-03-26 DIAGNOSIS — E038 Other specified hypothyroidism: Secondary | ICD-10-CM

## 2021-03-26 DIAGNOSIS — I10 Essential (primary) hypertension: Secondary | ICD-10-CM

## 2021-03-26 DIAGNOSIS — E785 Hyperlipidemia, unspecified: Secondary | ICD-10-CM

## 2021-03-26 NOTE — Patient Instructions (Signed)
Victoria Deleon,  It was great to talk to you today!  Please call me with any questions or concerns.   Visit Information  PATIENT GOALS:  Goals Addressed             This Visit's Progress    Medication Management       Patient Goals/Self-Care Activities Over the next 90 days, patient will:  Take medications as prescribed Collaborate with provider on medication access solutions            The patient verbalized understanding of instructions, educational materials, and care plan provided today and declined offer to receive copy of patient instructions, educational materials, and care plan.   Telephone follow up appointment with care management team member scheduled for:04/09/21  Kennon Holter, PharmD Clinical Pharmacist [facility] [phone]

## 2021-03-26 NOTE — Chronic Care Management (AMB) (Signed)
Chronic Care Management Pharmacy Note  03/26/2021 Name:  Victoria Deleon MRN:  [id number] DOB:  Nov 13, 1965  Summary:  Hypothyroidism (autoimmune Hashimotos) - followed by endocrinology (Dr. Dorris Fetch and Ms Marinus Maw) Patient wants a second opinion. Referral send to several endocrinologists within [facility] and all declined. Referral then facilitated to [facility] endocrinology. Patient was called and informed of this by myself on 03/25/21. Received call from patient this morning stating that after discussion with her husband, they were not willing to drive to [facility]. Patient did some research and wants to be referred to [facility] [phone]). Apparently they can see her in 2-3 weeks and have an endocrinology specialist that can help her. Sent message to CMA to facilitate referral.  Hyperlipidemia: Lipids will likely improve if able to get thyroid function under better control. 77 of my time recently has been spent coordinating endocrinology referral for 2nd opinion. May also need to consider additional lipid lowering therapy such as PCSK9i given extremely elevated LDL. Told patient to discuss with endocrinology at next visit.   Financial: Patient was instructed to apply for Medicare Low Income Subsidy (Medicare Extra Help). Will follow-up to see if she is approved and give recommendations for next steps.   Subjective: Victoria Deleon is an 55 y.o. year old female who is a primary patient of Noreene Larsson, NP.  The CCM team was consulted for assistance with disease management and care coordination needs.    Engaged with patient by telephone for follow up visit in response to provider referral for pharmacy case management and/or care coordination services.   Consent to Services:  The patient was given information about Chronic Care Management services, agreed to services, and gave verbal consent prior to initiation of services.  Please see initial  visit note for detailed documentation.   Patient Care Team: Noreene Larsson, NP as PCP - General (Nurse Practitioner) Satira Sark, MD as PCP - Cardiology (Cardiology) Gala Romney Cristopher Estimable, MD as Consulting Physician (Gastroenterology) Beryle Lathe, [facility] (Pharmacist)  Objective:  Lab Results  Component Value Date   CREATININE 0.85 03/02/2021   CREATININE 0.86 10/30/2020   CREATININE 0.97 07/07/2020    Lab Results  Component Value Date   HGBA1C 6.0 (H) 03/02/2021   Last diabetic Eye exam: No results found for: HMDIABEYEEXA  Last diabetic Foot exam: No results found for: HMDIABFOOTEX      Component Value Date/Time   CHOL 344 (H) 03/02/2021 0857   TRIG 161 (H) 03/02/2021 0857   HDL 62 03/02/2021 0857   CHOLHDL 6.2 (H) 10/30/2020 0951   LDLCALC 251 (H) 03/02/2021 0857    Hepatic Function Latest Ref Rng & Units 03/02/2021 10/30/2020 07/07/2020  Total Protein 6.0 - 8.5 g/dL 6.7 6.7 6.7  Albumin 3.8 - 4.9 g/dL 4.6 5.0(H) 4.5  AST 0 - 40 IU/L _0 ALT 0 - 32 IU/L _1 Alk Phosphatase 44 - 121 IU/L 68 73 67  Total Bilirubin 0.0 - 1.2 mg/dL 0.4 0.4 0.3    Lab Results  Component Value Date/Time   TSH 23.300 (H) 03/02/2021 08:58 AM   TSH 41.700 (H) 12/31/2020 09:21 AM   FREET4 0.70 (L) 03/02/2021 08:58 AM   FREET4 0.59 (L) 12/31/2020 09:21 AM    CBC Latest Ref Rng & Units 03/02/2021 10/30/2020 07/07/2020  WBC 3.4 - 10.8 x10E3/uL 5.0 5.9 7.1  Hemoglobin 11.1 - 15.9 g/dL 14.6 15.3 14.9  Hematocrit 34.0 - 46.6 %  42.3 44.9 42.0  Platelets 150 - 450 x10E3/uL 253 304 317    Lab Results  Component Value Date/Time   VD25OH 29.2 (L) 01/25/2020 10:24 AM    Clinical ASCVD: No  The ASCVD Risk score (Arnett DK, et al., 2019) failed to calculate for the following reasons:   The valid total cholesterol range is 130 to 320 mg/dL    Social History   Tobacco Use  Smoking Status Every Day   Packs/day: 0.25   Years: 30.00   Pack years: 7.50   Types: Cigarettes   Smokeless Tobacco Never   BP Readings from Last 3 Encounters:  03/02/21 (!) 143/87  02/13/21 132/74  01/28/21 (!) 149/91   Pulse Readings from Last 3 Encounters:  03/02/21 61  02/13/21 60  01/28/21 63   Wt Readings from Last 3 Encounters:  03/02/21 173 lb 0.6 oz (78.5 kg)  02/13/21 169 lb (76.7 kg)  01/28/21 171 lb 3.2 oz (77.7 kg)    Assessment: Review of patient past medical history, allergies, medications, health status, including review of consultants reports, laboratory and other test data, was performed as part of comprehensive evaluation and provision of chronic care management services.   SDOH:  (Social Determinants of Health) assessments and interventions performed:    CCM Care Plan  Allergies  Allergen Reactions   Codeine Anaphylaxis    Medications Reviewed Today     Reviewed by Beryle Lathe, [facility] (Pharmacist) on 03/26/21 at 0955  Med List Status: <None>   Medication Order Taking? Sig Documenting Provider Last Dose Status Informant  atorvastatin (LIPITOR) 80 MG tablet [id number] Yes Take 1 tablet (80 mg total) by mouth at bedtime. Lindell Spar, MD Taking Active   bisacodyl (DULCOLAX) 10 MG suppository [id number] Yes Place 1 suppository (10 mg total) rectally as needed for moderate constipation. Noreene Larsson, NP Taking Active   busPIRone (BUSPAR) 7.5 MG tablet [id number] Yes Take 1 tablet (7.5 mg total) by mouth 3 (three) times daily. Perlie Mayo, NP Taking Active   doxepin (SINEQUAN) 25 MG capsule [id number] Yes TAKE 1 CAPSULE BY MOUTHHONCE AT BEDTIME. [provider] Taking Active   DULoxetine (CYMBALTA) 30 MG capsule [id number] Yes Take 2 capsules by mouth daily. [provider] Taking Active   EPINEPHrine 0.3 mg/0.3 mL IJ SOAJ injection [id number]  Inject 0.3 mg into the muscle as needed for anaphylaxis. Lindell Spar, MD  Active   FIBER PO [id number] Yes Take 1 capsule by mouth 2 (two) times daily.  [provider]  Taking Active Self  gabapentin (NEURONTIN) 300 MG capsule [id number] Yes Take 300 mg by mouth 3 (three) times daily. [provider] Taking Active   Levothyroxine Sodium 150 MCG/ML SOLN [id number] Yes Take 150 mcg by mouth daily before breakfast. Brita Romp, NP Taking Active   linaclotide Rolan Lipa) 290 MCG CAPS capsule [id number] Yes Take 1 capsule (290 mcg total) by mouth daily before breakfast. Mahala Menghini, PA-C Taking Active   losartan (COZAAR) 50 MG tablet [id number] Yes Take 1 tablet (50 mg total) by mouth daily. Lindell Spar, MD Taking Active   methocarbamol (ROBAXIN) 500 MG tablet [id number] Yes Take 500 mg by mouth 2 (two) times daily. [provider] Taking Active   pantoprazole (PROTONIX) 40 MG tablet [id number] Yes TAKE 1 TABLET BY MOUTH ONCE DAILY. Noreene Larsson, NP Taking Active   pregabalin (LYRICA) 100 MG capsule [id number] Yes Take 100 mg by mouth 2 (two) times daily. [provider]  Taking Active   propranolol (INDERAL) 20 MG tablet [id number] Yes Take 20 mg by mouth 3 (three) times daily as needed for anxiety. [provider] Taking Active   sertraline (ZOLOFT) 50 MG tablet [id number] Yes Take 50 mg by mouth daily. [provider] Taking Active   tiZANidine (ZANAFLEX) 2 MG tablet [id number] Yes Take 1 tablet (2 mg total) by mouth every 6 (six) hours as needed. Leighton Parody, PA-C Taking Active   traMADol Veatrice Bourbon) 50 MG tablet [id number] Yes Take 50 mg by mouth daily. [provider] Taking Active   traZODone (DESYREL) 50 MG tablet [id number] Yes Take 50-100 mg by mouth at bedtime as needed. [provider] Taking Active             Patient Active Problem List   Diagnosis Date Noted   Polypharmacy 02/13/2021   Lumbar spondylosis 09/29/2020   Low back pain 02/11/2020   History of recurrent UTIs 92/33/0076   Lichen sclerosus 22/63/3354   Vaginal atrophy 02/11/2020   Anxiety 01/25/2020   PTSD  (post-traumatic stress disorder) 01/25/2020   Essential hypertension 01/25/2020   Screening mammogram, encounter for 01/25/2020   Routine cervical smear 01/25/2020   Vitamin D deficiency 01/25/2020   Hyperlipidemia 01/25/2020   Hypothyroidism 01/25/2020   GERD (gastroesophageal reflux disease) 07/27/2018   Constipation 07/27/2018   Crohn's disease ([place]) 02/13/2018    Immunization History  Administered Date(s) Administered   Moderna Sars-Covid-2 Vaccination 11/15/2019, 01/04/2020   Tdap 08/27/2020    Conditions to be addressed/monitored: HTN, HLD, Anxiety, and Hypothyroidism  Care Plan : Medication Management  Updates made by Beryle Lathe, [place] since 03/26/2021 12:00 AM     Problem: Hypothyroidism, HTN, HLD, Anxiety   Priority: High  Onset Date: 02/26/2021     Long-Range Goal: Disease Progression Prevention   Start Date: 02/26/2021  Expected End Date: 05/27/2021  Recent Progress: On track  Priority: High  Note:   Current Barriers:  Unable to independently afford treatment regimen Unable to achieve control of hyperlipidemia and hypothyroidism  Pharmacist Clinical Goal(s):  Over the next 90 days, patient will Verbalize ability to afford treatment regimen Achieve control of hyperlipidemia and hypothyroidsism as evidenced by improved LDL and improved TSH and free T4  through collaboration with PharmD and provider.   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 Dorris Fetch and Ms Marinus Maw) Patient wants a second opinion. Referral send to several endocrinologists within [facility] and all declined. Referral  then facilitated to [facility] endocrinology. Patient was called and informed of this by myself on 03/25/21. Received call from patient this morning stating that after discussion with her husband, they were not willing to drive to [facility]. Patient did some research and wants to be referred to [facility] [phone]). Apparently they can see her in 2-3 weeks and have an endocrinology specialist that can help her. Sent message to CMA to facilitate referral. Uncontrolled. TSH 41.7, Free T4 0.59 Current medications: compounded levothyroxine solution 150 mcg by mouth daily. Patient reports taking on an empty stomach 1 hour prior to other medications, food, or beverages Patient appears to have some sort of malabsorptive disorder but this is unclear. Patient is seeing GI due to a history of  Crohn's disease. According to her most recent colonoscopy, she does not have active Crohn's disease.  Patient's co-pay for compounded levothyroxine is ~$50 per month since this is not covered by her insurance.  Patient is in the process of applying for Medicare Low Income Subsidy (Medicare Extra Help). Will follow-up to see if she is approved and give recommendations for next steps.  Hypertension: Blood pressure under good control. Blood pressure is at goal of <130/80 mmHg per 2017 AHA/ACC guidelines. Current medications: losartan 50 mg by mouth once daily Intolerances: none Taking medications as directed: yes Side effects thought to be attributed to current medication regimen: no Denies dizziness and lightheadedness. Current exercise: not discussed today Current home blood pressure: patient checks but is unsure of exact numbers; reports that it is usually "normal" Continue losartan 50 mg by mouth once daily Encourage dietary sodium restriction/DASH diet Recommend regular aerobic exercise Recommend home blood pressure monitoring to discuss at next visit Discussed need for medication  compliance  Hyperlipidemia: Uncontrolled. LDL above goal of <100 due to moderate risk given <2 major CV risk factors (hypertension) and 10-year risk <10% per 2020 AACE/ACE guidelines. Triglycerides above goal of <150 per 2020 AACE/ACE guidelines. Current medications: atorvastatin 80 mg by mouth once daily Intolerances: none Taking medications as directed: yes Side effects thought to be attributed to current medication regimen: no Patient is a vegetarian and reports that she does not consume high amount of fat. Occasionally will eat salmon.  Continue atorvastatin 80 mg by mouth once daily Recommend regular aerobic exercise Reviewed risks of hyperlipidemia, principles of treatment and consequences of untreated hyperlipidemia Discussed need for medication compliance Re-check lipid panel in 4-12 weeks Lipids will likely improve if able to get thyroid function under better control. 53 of my time recently has been spent coordinating endocrinology referral for 2nd opinion. May also need to consider additional lipid lowering therapy such as PCSK9i given extremely elevated LDL. Told patient to discuss with endocrinology at next visit.   Anxiety (Managed by [facility]): Controlled per patient  Current medications: buspirone 7.5 mg by mouth three time daily, duloxetine 60 mg by mouth daily, sertraline 50 mg by mouth daily, propranolol 20 mg by mouth three time daily as needed Also takes trazodone and doxepin 25 mg at bedtime for insomnia and gabapentin and pregabalin Patient appears to have multiple duplications of medications. Confirmed with patient that this was intentional by her outside provider and she confirmed. Unsure of medical necessity.   IBS: Patient currently takes Linzess and this works well for her but her co-pay is expensive.  Patient was instructed to apply for Medicare Low Income Subsidy (Medicare Extra Help). Will follow-up to see if she is approved and give  recommendations for next steps. If approved, the medication cost will be ~$10 per month. If denied, can apply for manufacturer assistance program through [place]. Patient given blank patient assistance application for Linzess in office today should she need it to apply.  Patient Goals/Self-Care Activities Over the next 90 days, patient will:  Take medications as prescribed Collaborate with provider on medication access solutions  Follow Up Plan: Telephone follow up appointment with care management team member scheduled for: 04/09/21      Medication Assistance:  Patient to apply for Medicare Extra Help  Patient's preferred pharmacy is:  [facility] [address] [place] [address] Phone: [phone] Fax: [phone]  Follow Up:  Patient agrees to Care Plan and Follow-up.  Plan: Telephone follow up appointment with care management team member scheduled for:  04/09/21  Kennon Holter, PharmD Clinical Pharmacist [facility] [phone]

## 2021-03-30 DIAGNOSIS — E785 Hyperlipidemia, unspecified: Secondary | ICD-10-CM

## 2021-03-30 DIAGNOSIS — E038 Other specified hypothyroidism: Secondary | ICD-10-CM

## 2021-03-30 DIAGNOSIS — I10 Essential (primary) hypertension: Secondary | ICD-10-CM | POA: Diagnosis not present

## 2021-03-30 DIAGNOSIS — E063 Autoimmune thyroiditis: Secondary | ICD-10-CM

## 2021-03-31 ENCOUNTER — Telehealth: Payer: Self-pay

## 2021-03-31 ENCOUNTER — Other Ambulatory Visit: Payer: Self-pay | Admitting: *Deleted

## 2021-03-31 DIAGNOSIS — E038 Other specified hypothyroidism: Secondary | ICD-10-CM

## 2021-03-31 DIAGNOSIS — E063 Autoimmune thyroiditis: Secondary | ICD-10-CM

## 2021-03-31 NOTE — Telephone Encounter (Signed)
Patient called, asked to return call [phone]

## 2021-04-01 ENCOUNTER — Telehealth: Payer: Self-pay | Admitting: Nurse Practitioner

## 2021-04-01 NOTE — Telephone Encounter (Signed)
[place] w/ [facility] , Dr. Ronnald Collum office called in on patients behalf requesting referral. Jeani Hawking states the the referral was faxed over but only one page of the referral was received. Needs referral re faxed   Fax # [phone]

## 2021-04-02 DIAGNOSIS — F17200 Nicotine dependence, unspecified, uncomplicated: Secondary | ICD-10-CM | POA: Diagnosis not present

## 2021-04-02 DIAGNOSIS — Z6832 Body mass index (BMI) 32.0-32.9, adult: Secondary | ICD-10-CM | POA: Diagnosis not present

## 2021-04-02 DIAGNOSIS — G8929 Other chronic pain: Secondary | ICD-10-CM | POA: Diagnosis not present

## 2021-04-02 DIAGNOSIS — M545 Low back pain, unspecified: Secondary | ICD-10-CM | POA: Diagnosis not present

## 2021-04-02 DIAGNOSIS — G894 Chronic pain syndrome: Secondary | ICD-10-CM | POA: Diagnosis not present

## 2021-04-02 DIAGNOSIS — F17210 Nicotine dependence, cigarettes, uncomplicated: Secondary | ICD-10-CM | POA: Diagnosis not present

## 2021-04-02 DIAGNOSIS — R030 Elevated blood-pressure reading, without diagnosis of hypertension: Secondary | ICD-10-CM | POA: Diagnosis not present

## 2021-04-02 DIAGNOSIS — Z79899 Other long term (current) drug therapy: Secondary | ICD-10-CM | POA: Diagnosis not present

## 2021-04-03 DIAGNOSIS — M4726 Other spondylosis with radiculopathy, lumbar region: Secondary | ICD-10-CM | POA: Diagnosis not present

## 2021-04-03 DIAGNOSIS — E785 Hyperlipidemia, unspecified: Secondary | ICD-10-CM | POA: Diagnosis not present

## 2021-04-03 DIAGNOSIS — E039 Hypothyroidism, unspecified: Secondary | ICD-10-CM | POA: Diagnosis not present

## 2021-04-03 DIAGNOSIS — K219 Gastro-esophageal reflux disease without esophagitis: Secondary | ICD-10-CM | POA: Diagnosis not present

## 2021-04-03 DIAGNOSIS — I10 Essential (primary) hypertension: Secondary | ICD-10-CM | POA: Diagnosis not present

## 2021-04-03 DIAGNOSIS — K5901 Slow transit constipation: Secondary | ICD-10-CM | POA: Diagnosis not present

## 2021-04-03 DIAGNOSIS — F411 Generalized anxiety disorder: Secondary | ICD-10-CM | POA: Diagnosis not present

## 2021-04-06 DIAGNOSIS — Z79899 Other long term (current) drug therapy: Secondary | ICD-10-CM | POA: Diagnosis not present

## 2021-04-08 ENCOUNTER — Other Ambulatory Visit: Payer: Self-pay | Admitting: Internal Medicine

## 2021-04-08 DIAGNOSIS — I10 Essential (primary) hypertension: Secondary | ICD-10-CM

## 2021-04-09 ENCOUNTER — Ambulatory Visit (INDEPENDENT_AMBULATORY_CARE_PROVIDER_SITE_OTHER): Payer: Medicare HMO | Admitting: Pharmacist

## 2021-04-09 DIAGNOSIS — F419 Anxiety disorder, unspecified: Secondary | ICD-10-CM

## 2021-04-09 DIAGNOSIS — E038 Other specified hypothyroidism: Secondary | ICD-10-CM

## 2021-04-09 DIAGNOSIS — E063 Autoimmune thyroiditis: Secondary | ICD-10-CM

## 2021-04-09 DIAGNOSIS — I10 Essential (primary) hypertension: Secondary | ICD-10-CM

## 2021-04-09 DIAGNOSIS — E785 Hyperlipidemia, unspecified: Secondary | ICD-10-CM

## 2021-04-09 NOTE — Chronic Care Management (AMB) (Signed)
Chronic Care Management Pharmacy Note  04/09/2021 Name:  Victoria Deleon MRN:  [id number] DOB:  November 28, 1965  Summary:  Was supposed to have follow-up visit with patient today but received notification that she will be transferring her care to another primary care office. Therefore, all chronic care management clinical and patient goals have been marked as complete. I will remove myself from patient's care team and un-enroll patient in chronic care management services a this office.   Subjective: Victoria Deleon is an 55 y.o. year old female who is a primary patient of No primary care provider on file..  The CCM team was consulted for assistance with disease management and care coordination needs.    Attempted to engaged with patient by telephone for follow up visit in response to provider referral for pharmacy case management and/or care coordination services.   Consent to Services:  The patient was given information about Chronic Care Management services, agreed to services, and gave verbal consent prior to initiation of services.  Please see initial visit note for detailed documentation.   Patient Care Team: Satira Sark, MD as PCP - Cardiology (Cardiology) Gala Romney Cristopher Estimable, MD as Consulting Physician (Gastroenterology) Beryle Lathe, [facility] (Pharmacist)  Objective:  Lab Results  Component Value Date   CREATININE 0.85 03/02/2021   CREATININE 0.86 10/30/2020   CREATININE 0.97 07/07/2020    Lab Results  Component Value Date   HGBA1C 6.0 (H) 03/02/2021   Last diabetic Eye exam: No results found for: HMDIABEYEEXA  Last diabetic Foot exam: No results found for: HMDIABFOOTEX      Component Value Date/Time   CHOL 344 (H) 03/02/2021 0857   TRIG 161 (H) 03/02/2021 0857   HDL 62 03/02/2021 0857   CHOLHDL 6.2 (H) 10/30/2020 0951   LDLCALC 251 (H) 03/02/2021 0857    Hepatic Function Latest Ref Rng & Units 03/02/2021 10/30/2020 07/07/2020  Total Protein 6.0 - 8.5  g/dL 6.7 6.7 6.7  Albumin 3.8 - 4.9 g/dL 4.6 5.0(H) 4.5  AST 0 - 40 IU/L _0 ALT 0 - 32 IU/L _1 Alk Phosphatase 44 - 121 IU/L 68 73 67  Total Bilirubin 0.0 - 1.2 mg/dL 0.4 0.4 0.3    Lab Results  Component Value Date/Time   TSH 23.300 (H) 03/02/2021 08:58 AM   TSH 41.700 (H) 12/31/2020 09:21 AM   FREET4 0.70 (L) 03/02/2021 08:58 AM   FREET4 0.59 (L) 12/31/2020 09:21 AM    CBC Latest Ref Rng & Units 03/02/2021 10/30/2020 07/07/2020  WBC 3.4 - 10.8 x10E3/uL 5.0 5.9 7.1  Hemoglobin 11.1 - 15.9 g/dL 14.6 15.3 14.9  Hematocrit 34.0 - 46.6 % 42.3 44.9 42.0  Platelets 150 - 450 x10E3/uL 253 304 317    Lab Results  Component Value Date/Time   VD25OH 29.2 (L) 01/25/2020 10:24 AM    Clinical ASCVD: No  The ASCVD Risk score (Arnett DK, et al., 2019) failed to calculate for the following reasons:   The valid total cholesterol range is 130 to 320 mg/dL    Social History   Tobacco Use  Smoking Status Every Day   Packs/day: 0.25   Years: 30.00   Pack years: 7.50   Types: Cigarettes  Smokeless Tobacco Never   BP Readings from Last 3 Encounters:  03/02/21 (!) 143/87  02/13/21 132/74  01/28/21 (!) 149/91   Pulse Readings from Last 3 Encounters:  03/02/21 61  02/13/21 60  01/28/21 63   Wt Readings  from Last 3 Encounters:  03/02/21 173 lb 0.6 oz (78.5 kg)  02/13/21 169 lb (76.7 kg)  01/28/21 171 lb 3.2 oz (77.7 kg)    Assessment: Review of patient past medical history, allergies, medications, health status, including review of consultants reports, laboratory and other test data, was performed as part of comprehensive evaluation and provision of chronic care management services.   SDOH:  (Social Determinants of Health) assessments and interventions performed:    CCM Care Plan  Allergies  Allergen Reactions   Codeine Anaphylaxis    Medications Reviewed Today     Reviewed by Beryle Lathe, [facility] (Pharmacist) on 04/09/21 at 1124  Med List Status: <None>    Medication Order Taking? Sig Documenting Provider Last Dose Status Informant  atorvastatin (LIPITOR) 80 MG tablet [id number] No Take 1 tablet (80 mg total) by mouth at bedtime. Lindell Spar, MD Taking Active   bisacodyl (DULCOLAX) 10 MG suppository [id number] No Place 1 suppository (10 mg total) rectally as needed for moderate constipation. Noreene Larsson, NP Taking Active   busPIRone (BUSPAR) 7.5 MG tablet [id number] No Take 1 tablet (7.5 mg total) by mouth 3 (three) times daily. Perlie Mayo, NP Taking Active   doxepin (SINEQUAN) 25 MG capsule [id number] No TAKE 1 CAPSULE BY MOUTHHONCE AT BEDTIME. [provider] Taking Active   DULoxetine (CYMBALTA) 30 MG capsule [id number] No Take 2 capsules by mouth daily. [provider] Taking Active   EPINEPHrine 0.3 mg/0.3 mL IJ SOAJ injection [id number] No Inject 0.3 mg into the muscle as needed for anaphylaxis. Lindell Spar, MD Taking Active   FIBER PO [id number] No Take 1 capsule by mouth 2 (two) times daily.  [provider] Taking Active Self  gabapentin (NEURONTIN) 300 MG capsule [id number] No Take 300 mg by mouth 3 (three) times daily. [provider] Taking Active   Levothyroxine Sodium 150 MCG/ML SOLN [id number] No Take 150 mcg by mouth daily before breakfast. Brita Romp, NP Taking Active   linaclotide Rolan Lipa) 290 MCG CAPS capsule [id number] No Take 1 capsule (290 mcg total) by mouth daily before breakfast. Mahala Menghini, PA-C Taking Active   losartan (COZAAR) 50 MG tablet [id number]  TAKE 1 TABLET BY MOUTH ONCE DAY. Fayrene Helper, MD  Active   methocarbamol (ROBAXIN) 500 MG tablet [id number] No Take 500 mg by mouth 2 (two) times daily. [provider] Taking Active   pantoprazole (PROTONIX) 40 MG tablet [id number] No TAKE 1 TABLET BY MOUTH ONCE DAILY. Noreene Larsson, NP Taking Active   pregabalin (LYRICA) 100 MG capsule [id number] No Take 100 mg by mouth 2 (two) times daily. [provider] Taking Active   propranolol (INDERAL) 20 MG tablet [id number] No Take 20 mg by mouth 3 (three) times daily as needed for anxiety. [provider] Taking Active   sertraline (ZOLOFT) 50 MG tablet [id number] No Take 50 mg by mouth daily. [provider] Taking Active   tiZANidine (ZANAFLEX) 2 MG tablet [id number] No Take 1 tablet (2 mg total) by mouth every 6 (six) hours as needed. Leighton Parody, PA-C Taking Active   traMADol (ULTRAM) 50 MG tablet [id number] No Take 50 mg by mouth daily. [provider] Taking Active   traZODone (DESYREL) 50 MG tablet [id number] No Take 50-100 mg by mouth at bedtime as needed. [provider] Taking Active             Patient Active Problem List   Diagnosis Date  Noted   Polypharmacy 02/13/2021   Lumbar spondylosis 09/29/2020   Low back pain 02/11/2020   History of recurrent UTIs 64/15/8309   Lichen sclerosus 40/76/8088   Vaginal atrophy 02/11/2020   Anxiety 01/25/2020   PTSD (post-traumatic stress disorder) 01/25/2020   Essential hypertension 01/25/2020   Screening mammogram, encounter for 01/25/2020   Routine cervical smear 01/25/2020   Vitamin D deficiency 01/25/2020   Hyperlipidemia 01/25/2020   Hypothyroidism 01/25/2020   GERD (gastroesophageal reflux disease) 07/27/2018   Constipation 07/27/2018   Crohn's disease ([place]) 02/13/2018    Immunization History  Administered Date(s) Administered   Moderna Sars-Covid-2 Vaccination 11/15/2019, 01/04/2020   Tdap 08/27/2020    Conditions to be addressed/monitored: HTN, HLD, Anxiety, and Hypothyroidism  Care Plan : Medication Management  Updates made by Beryle Lathe, [place] since 04/09/2021 12:00 AM  Completed 04/09/2021   Problem: Hypothyroidism, HTN, HLD, Anxiety Resolved 04/09/2021  Priority: High  Onset Date: 02/26/2021     Long-Range Goal: Disease Progression Prevention Completed 04/09/2021  Start Date: 02/26/2021  Expected End  Date: 05/27/2021  Recent Progress: On track  Priority: High  Note:   Current Barriers:  Unable to independently afford treatment regimen Unable to achieve control of hyperlipidemia and hypothyroidism  Pharmacist Clinical Goal(s):  Over the next 90 days, patient will Verbalize ability to afford treatment regimen Achieve control of hyperlipidemia and hypothyroidsism as evidenced by improved LDL and improved TSH and free T4  through collaboration with PharmD and provider.   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 Dorris Fetch and Ms Marinus Maw) Patient wants a second opinion. Referral send to several endocrinologists within [facility] ([facility]) and all declined. Referral then facilitated to [facility] endocrinology. Patient was called and informed of this by myself on 03/25/21. Received call from patient this morning stating that after discussion with her husband, they were not willing to drive to [facility]. Patient did some research and wants to be referred to [facility] [phone]). Apparently they can see her in 2-3 weeks and have an endocrinology specialist that can help her. Sent message to CMA to facilitate referral. Uncontrolled. TSH 41.7, Free T4 0.59 Current medications: compounded levothyroxine solution 150 mcg by mouth daily. Patient reports taking on an empty stomach 1 hour prior to other medications, food, or beverages Patient appears to have some sort of malabsorptive disorder but this is unclear. Patient is seeing GI due to a history of Crohn's disease. According to her most recent colonoscopy, she  does not have active Crohn's disease.  Patient's co-pay for compounded levothyroxine is ~$50 per month since this is not covered by her insurance.  Patient is in the process of applying for Medicare Low Income Subsidy (Medicare Extra Help). Will follow-up to see if she is approved and give recommendations for next steps.  Hypertension: Blood pressure under good control. Blood pressure is at goal of <130/80 mmHg per 2017 AHA/ACC guidelines. Current medications: losartan 50 mg by mouth once daily Intolerances: none Taking medications as directed: yes Side effects thought to be attributed to current medication regimen: no Denies dizziness and lightheadedness. Current exercise: not discussed today Current home blood pressure: patient checks but is unsure of exact numbers; reports that it is usually "normal" Continue losartan  50 mg by mouth once daily Encourage dietary sodium restriction/DASH diet Recommend regular aerobic exercise Recommend home blood pressure monitoring to discuss at next visit Discussed need for medication compliance  Hyperlipidemia: Uncontrolled. LDL above goal of <100 due to moderate risk given <2 major CV risk factors (hypertension) and 10-year risk <10% per 2020 AACE/ACE guidelines. Triglycerides above goal of <150 per 2020 AACE/ACE guidelines. Current medications: atorvastatin 80 mg by mouth once daily Intolerances: none Taking medications as directed: yes Side effects thought to be attributed to current medication regimen: no Patient is a vegetarian and reports that she does not consume high amount of fat. Occasionally will eat salmon.  Continue atorvastatin 80 mg by mouth once daily Recommend regular aerobic exercise Reviewed risks of hyperlipidemia, principles of treatment and consequences of untreated hyperlipidemia Discussed need for medication compliance Re-check lipid panel in 4-12 weeks Lipids will likely improve if able to get thyroid function under better  control. 74 of my time recently has been spent coordinating endocrinology referral for 2nd opinion. May also need to consider additional lipid lowering therapy such as PCSK9i given extremely elevated LDL. Told patient to discuss with endocrinology at next visit.   Anxiety (Managed by [facility]): Controlled per patient  Current medications: buspirone 7.5 mg by mouth three time daily, duloxetine 60 mg by mouth daily, sertraline 50 mg by mouth daily, propranolol 20 mg by mouth three time daily as needed Also takes trazodone and doxepin 25 mg at bedtime for insomnia and gabapentin and pregabalin Patient appears to have multiple duplications of medications. Confirmed with patient that this was intentional by her outside provider and she confirmed. Unsure of medical necessity.   IBS: Patient currently takes Linzess and this works well for her but her co-pay is expensive.  Patient was instructed to apply for Medicare Low Income Subsidy (Medicare Extra Help). Will follow-up to see if she is approved and give recommendations for next steps. If approved, the medication cost will be ~$10 per month. If denied, can apply for manufacturer assistance program through [employer]. Patient given blank patient assistance application for Linzess in office today should she need it to apply.  Patient Goals/Self-Care Activities Over the next 90 days, patient will:  Take medications as prescribed Collaborate with provider on medication access solutions  Follow Up Plan: Telephone follow up appointment with care management team member scheduled for: 04/09/21      Medication Assistance: None required.  Patient affirms current coverage meets needs.  Patient's preferred pharmacy is:  [facility], [place] [address] Phone: [phone] Fax: [phone]  Follow Up:  None. Patient is transferring care. Chronic care management at our office will be  discontinued.   Kennon Holter, PharmD Clinical Pharmacist [facility] [phone]

## 2021-04-14 ENCOUNTER — Other Ambulatory Visit: Payer: Self-pay | Admitting: Nurse Practitioner

## 2021-04-20 DIAGNOSIS — K581 Irritable bowel syndrome with constipation: Secondary | ICD-10-CM | POA: Diagnosis not present

## 2021-04-20 DIAGNOSIS — E039 Hypothyroidism, unspecified: Secondary | ICD-10-CM | POA: Diagnosis not present

## 2021-04-20 DIAGNOSIS — M48061 Spinal stenosis, lumbar region without neurogenic claudication: Secondary | ICD-10-CM | POA: Diagnosis not present

## 2021-04-20 DIAGNOSIS — E7800 Pure hypercholesterolemia, unspecified: Secondary | ICD-10-CM | POA: Diagnosis not present

## 2021-04-21 ENCOUNTER — Ambulatory Visit: Payer: Medicare HMO | Admitting: Nurse Practitioner

## 2021-04-21 NOTE — Patient Instructions (Incomplete)

## 2021-04-27 DIAGNOSIS — F411 Generalized anxiety disorder: Secondary | ICD-10-CM | POA: Diagnosis not present

## 2021-04-28 DIAGNOSIS — M47816 Spondylosis without myelopathy or radiculopathy, lumbar region: Secondary | ICD-10-CM | POA: Diagnosis not present

## 2021-04-29 DIAGNOSIS — E785 Hyperlipidemia, unspecified: Secondary | ICD-10-CM

## 2021-04-29 DIAGNOSIS — E063 Autoimmune thyroiditis: Secondary | ICD-10-CM

## 2021-04-29 DIAGNOSIS — I10 Essential (primary) hypertension: Secondary | ICD-10-CM

## 2021-04-29 DIAGNOSIS — E038 Other specified hypothyroidism: Secondary | ICD-10-CM

## 2021-05-04 DIAGNOSIS — F17200 Nicotine dependence, unspecified, uncomplicated: Secondary | ICD-10-CM | POA: Diagnosis not present

## 2021-05-04 DIAGNOSIS — F17210 Nicotine dependence, cigarettes, uncomplicated: Secondary | ICD-10-CM | POA: Diagnosis not present

## 2021-05-04 DIAGNOSIS — Z79899 Other long term (current) drug therapy: Secondary | ICD-10-CM | POA: Diagnosis not present

## 2021-05-04 DIAGNOSIS — G894 Chronic pain syndrome: Secondary | ICD-10-CM | POA: Diagnosis not present

## 2021-05-04 DIAGNOSIS — Z6833 Body mass index (BMI) 33.0-33.9, adult: Secondary | ICD-10-CM | POA: Diagnosis not present

## 2021-05-04 DIAGNOSIS — M545 Low back pain, unspecified: Secondary | ICD-10-CM | POA: Diagnosis not present

## 2021-05-04 DIAGNOSIS — R030 Elevated blood-pressure reading, without diagnosis of hypertension: Secondary | ICD-10-CM | POA: Diagnosis not present

## 2021-05-06 DIAGNOSIS — Z79899 Other long term (current) drug therapy: Secondary | ICD-10-CM | POA: Diagnosis not present

## 2021-05-07 DIAGNOSIS — K581 Irritable bowel syndrome with constipation: Secondary | ICD-10-CM | POA: Diagnosis not present

## 2021-05-07 DIAGNOSIS — E039 Hypothyroidism, unspecified: Secondary | ICD-10-CM | POA: Diagnosis not present

## 2021-05-07 DIAGNOSIS — E063 Autoimmune thyroiditis: Secondary | ICD-10-CM | POA: Diagnosis not present

## 2021-05-07 DIAGNOSIS — E7800 Pure hypercholesterolemia, unspecified: Secondary | ICD-10-CM | POA: Diagnosis not present

## 2021-05-07 DIAGNOSIS — M48061 Spinal stenosis, lumbar region without neurogenic claudication: Secondary | ICD-10-CM | POA: Diagnosis not present

## 2021-05-26 ENCOUNTER — Other Ambulatory Visit: Payer: Self-pay | Admitting: Family Medicine

## 2021-05-26 DIAGNOSIS — I10 Essential (primary) hypertension: Secondary | ICD-10-CM

## 2021-06-02 ENCOUNTER — Ambulatory Visit: Payer: Medicare HMO | Admitting: Nurse Practitioner

## 2021-06-09 DIAGNOSIS — R030 Elevated blood-pressure reading, without diagnosis of hypertension: Secondary | ICD-10-CM | POA: Diagnosis not present

## 2021-06-09 DIAGNOSIS — Z6831 Body mass index (BMI) 31.0-31.9, adult: Secondary | ICD-10-CM | POA: Diagnosis not present

## 2021-06-09 DIAGNOSIS — F17210 Nicotine dependence, cigarettes, uncomplicated: Secondary | ICD-10-CM | POA: Diagnosis not present

## 2021-06-09 DIAGNOSIS — Z79899 Other long term (current) drug therapy: Secondary | ICD-10-CM | POA: Diagnosis not present

## 2021-06-09 DIAGNOSIS — G8929 Other chronic pain: Secondary | ICD-10-CM | POA: Diagnosis not present

## 2021-06-09 DIAGNOSIS — F17200 Nicotine dependence, unspecified, uncomplicated: Secondary | ICD-10-CM | POA: Diagnosis not present

## 2021-06-09 DIAGNOSIS — Z0000 Encounter for general adult medical examination without abnormal findings: Secondary | ICD-10-CM | POA: Diagnosis not present

## 2021-06-09 DIAGNOSIS — G894 Chronic pain syndrome: Secondary | ICD-10-CM | POA: Diagnosis not present

## 2021-06-09 DIAGNOSIS — M545 Low back pain, unspecified: Secondary | ICD-10-CM | POA: Diagnosis not present

## 2021-06-11 DIAGNOSIS — Z79899 Other long term (current) drug therapy: Secondary | ICD-10-CM | POA: Diagnosis not present

## 2021-06-12 DIAGNOSIS — K5900 Constipation, unspecified: Secondary | ICD-10-CM | POA: Diagnosis not present

## 2021-06-12 DIAGNOSIS — K219 Gastro-esophageal reflux disease without esophagitis: Secondary | ICD-10-CM | POA: Diagnosis not present

## 2021-06-18 DIAGNOSIS — M47816 Spondylosis without myelopathy or radiculopathy, lumbar region: Secondary | ICD-10-CM | POA: Diagnosis not present

## 2021-06-19 DIAGNOSIS — E039 Hypothyroidism, unspecified: Secondary | ICD-10-CM | POA: Diagnosis not present

## 2021-06-19 DIAGNOSIS — E785 Hyperlipidemia, unspecified: Secondary | ICD-10-CM | POA: Diagnosis not present

## 2021-06-25 DIAGNOSIS — M5416 Radiculopathy, lumbar region: Secondary | ICD-10-CM | POA: Diagnosis not present

## 2021-06-25 DIAGNOSIS — M5440 Lumbago with sciatica, unspecified side: Secondary | ICD-10-CM | POA: Diagnosis not present

## 2021-06-26 DIAGNOSIS — E039 Hypothyroidism, unspecified: Secondary | ICD-10-CM | POA: Diagnosis not present

## 2021-07-03 DIAGNOSIS — M47816 Spondylosis without myelopathy or radiculopathy, lumbar region: Secondary | ICD-10-CM | POA: Diagnosis not present

## 2021-07-07 DIAGNOSIS — G8929 Other chronic pain: Secondary | ICD-10-CM | POA: Diagnosis not present

## 2021-07-07 DIAGNOSIS — Z6833 Body mass index (BMI) 33.0-33.9, adult: Secondary | ICD-10-CM | POA: Diagnosis not present

## 2021-07-07 DIAGNOSIS — Z79899 Other long term (current) drug therapy: Secondary | ICD-10-CM | POA: Diagnosis not present

## 2021-07-07 DIAGNOSIS — G894 Chronic pain syndrome: Secondary | ICD-10-CM | POA: Diagnosis not present

## 2021-07-07 DIAGNOSIS — M545 Low back pain, unspecified: Secondary | ICD-10-CM | POA: Diagnosis not present

## 2021-07-07 DIAGNOSIS — M47816 Spondylosis without myelopathy or radiculopathy, lumbar region: Secondary | ICD-10-CM | POA: Diagnosis not present

## 2021-07-07 DIAGNOSIS — F17200 Nicotine dependence, unspecified, uncomplicated: Secondary | ICD-10-CM | POA: Diagnosis not present

## 2021-07-07 DIAGNOSIS — F17210 Nicotine dependence, cigarettes, uncomplicated: Secondary | ICD-10-CM | POA: Diagnosis not present

## 2021-07-07 DIAGNOSIS — R030 Elevated blood-pressure reading, without diagnosis of hypertension: Secondary | ICD-10-CM | POA: Diagnosis not present

## 2021-07-14 DIAGNOSIS — F411 Generalized anxiety disorder: Secondary | ICD-10-CM | POA: Diagnosis not present

## 2021-07-22 DIAGNOSIS — M47816 Spondylosis without myelopathy or radiculopathy, lumbar region: Secondary | ICD-10-CM | POA: Diagnosis not present

## 2021-07-28 NOTE — Progress Notes (Signed)
? ? ? ? ?Primary Care Physician: Perlie Mayo, NP ? ?Primary Gastroenterologist:  Garfield Cornea, MD ? ? ?Chief Complaint  ?Patient presents with  ? Constipation  ?  Between constipation and diarrhea, today is 4th day with constipation  ? ? ?HPI: Victoria Deleon is a 56 y.o. female here for follow up. Last seen in 12/2020. History of small bowel Crohn's disease, diagnosed in 2006.  Never on maintenance medication.  History of severe ulcerative reflux esophagitis on EGD in 2019.  Colonoscopy completed same day with normal TI, normal colon, internal hemorrhoids, status post ileal biopsy without significant histopathological changes in colon without significant histopathologic changes.  Recommended repeat colonoscopy in 10 years.  Historically has constipation. ? ?Cannot get the Linzess. $150 out of pocket and cannot afford. Works well. Amitiza caused nausea. Trulance worked and also too expensive.  Previously tried MiraLAX daily by itself which was not helpful.  Has used lactulose in the past but does not like the taste very much. ? ?She is having a lot of bloating and nausea which she feels is directly related to her constipation.  She is a vegetarian, gets a lot of fiber in her diet.  She is also taking gummy fibers 2 daily.  She uses a laxative after not going to the bathroom for 3 days.  She is still fairly active.  She has chronic back pain which is managed with nerve blocks.  Right now she has not had a bowel movement in 3 to 4 days and she feels very bloated and stomach hurts some.  Typically this gets better when she has a good bowel movement.  No vomiting.  Her heartburn is better managed.  No melena or rectal bleeding. ? ?States they are still trying to get her thyroid under control.  Have tried multiple alternatives.  Continues to see specialist in [place]. ? ?Current Outpatient Medications  ?Medication Sig Dispense Refill  ? atorvastatin (LIPITOR) 80 MG tablet Take 1 tablet (80 mg total) by mouth at  bedtime. 90 tablet 1  ? bisacodyl (DULCOLAX) 10 MG suppository Place 1 suppository (10 mg total) rectally as needed for moderate constipation. 12 suppository 0  ? busPIRone (BUSPAR) 7.5 MG tablet Take 1 tablet (7.5 mg total) by mouth 3 (three) times daily. 90 tablet 1  ? doxepin (SINEQUAN) 25 MG capsule TAKE 1 CAPSULE BY MOUTHHONCE AT BEDTIME.    ? DULoxetine (CYMBALTA) 30 MG capsule Take 2 capsules by mouth daily.    ? EPINEPHrine 0.3 mg/0.3 mL IJ SOAJ injection Inject 0.3 mg into the muscle as needed for anaphylaxis. 1 each 2  ? FIBER PO Take 1 capsule by mouth 2 (two) times daily.     ? gabapentin (NEURONTIN) 300 MG capsule Take 300 mg by mouth 3 (three) times daily.    ? Levothyroxine Sodium 150 MCG/ML SOLN Take 150 mcg by mouth daily before breakfast. 90 mL 3  ? linaclotide (LINZESS) 290 MCG CAPS capsule Take 1 capsule (290 mcg total) by mouth daily before breakfast. 30 capsule 5  ? losartan (COZAAR) 50 MG tablet TAKE 1 TABLET BY MOUTH ONCE DAY. 90 tablet 0  ? methocarbamol (ROBAXIN) 500 MG tablet Take 500 mg by mouth 2 (two) times daily.    ? pantoprazole (PROTONIX) 40 MG tablet TAKE 1 TABLET BY MOUTH ONCE DAILY. 30 tablet 0  ? pregabalin (LYRICA) 100 MG capsule Take 100 mg by mouth 2 (two) times daily.    ? propranolol (INDERAL) 20 MG tablet Take  20 mg by mouth 3 (three) times daily as needed for anxiety.    ? sertraline (ZOLOFT) 50 MG tablet Take 50 mg by mouth daily.    ? traZODone (DESYREL) 50 MG tablet Take 50-100 mg by mouth at bedtime as needed.    ? ?No current facility-administered medications for this visit.  ? ? ?Allergies as of 07/29/2021 - Review Complete 07/29/2021  ?Allergen Reaction Noted  ? Codeine Anaphylaxis 06/02/2017  ? ? ?ROS: ? ?General: Negative for anorexia, weight loss, fever, chills, fatigue, weakness. ?ENT: Negative for hoarseness, difficulty swallowing , nasal congestion. ?CV: Negative for chest pain, angina, palpitations, dyspnea on exertion, peripheral edema.  ?Respiratory:  Negative for dyspnea at rest, dyspnea on exertion, cough, sputum, wheezing.  ?GI: See history of present illness. ?GU:  Negative for dysuria, hematuria, urinary incontinence, urinary frequency, nocturnal urination.  ?Endo: Negative for unusual weight change.  ?  ?Physical Examination: ? ? BP (!) 142/80   Pulse (!) 56   Temp 97.7 ?F (36.5 ?C)   Ht 5' 1"  (1.549 m)   Wt 175 lb 3.2 oz (79.5 kg)   SpO2 97%   BMI 33.10 kg/m?  ? ?General: Well-nourished, well-developed in no acute distress.  ?Eyes: No icterus. ?Mouth: masked ?Lungs: Clear to auscultation bilaterally.  ?Heart: Regular rate and rhythm, no murmurs rubs or gallops.  ?Abdomen: Bowel sounds are normal, nondistended, no hepatosplenomegaly or masses, no abdominal bruits or hernia , no rebound or guarding.  Mild lower abdominal tenderness. ?Extremities: No lower extremity edema. No clubbing or deformities. ?Neuro: Alert and oriented x 4   ?Skin: Warm and dry, no jaundice.   ?Psych: Alert and cooperative, normal mood and affect. ? ?Labs:  ?Lab Results  ?Component Value Date  ? HGBA1C 6.0 (H) 03/02/2021  ? ?Lab Results  ?Component Value Date  ? CREATININE 0.85 03/02/2021  ? BUN 12 03/02/2021  ? NA 139 03/02/2021  ? K 4.6 03/02/2021  ? CL 100 03/02/2021  ? CO2 24 03/02/2021  ? ?Lab Results  ?Component Value Date  ? ALT 11 03/02/2021  ? AST 14 03/02/2021  ? ALKPHOS 68 03/02/2021  ? BILITOT 0.4 03/02/2021  ? ?Lab Results  ?Component Value Date  ? WBC 5.0 03/02/2021  ? HGB 14.6 03/02/2021  ? HCT 42.3 03/02/2021  ? MCV 90 03/02/2021  ? PLT 253 03/02/2021  ? ?  ?Lab Results  ?Component Value Date  ? TSH 23.300 (H) 03/02/2021  ? ? ? ?Imaging Studies: ?No results found. ? ? ?Assessment: ? ?Constipation: Poorly managed due to difficulty obtaining medication (expense).  MiraLAX daily was not helpful.  Linzess has been the most effective however currently would cost her $150 a month and she is on a fixed income and cannot afford.  We will see if Amitiza generic would be  any cheaper.  We have given her Linzess samples today until we can figure out the best alternative. ? ?GERD: Continue pantoprazole 40 mg daily. ? ?Crohn's: She provides history of small bowel Crohn's disease diagnosed in 2006 in [place].  In 2019 she had a ileocolonoscopy which was unremarkable.  Patient is not on medication.   ? ? ?Plan: ?Samples of Linzess 290 mcg provided to take 1 daily as needed until we sort out what medication will be the most affordable for her.   ?Prescription for generic Amitiza 24 mcg twice daily with food sent to her pharmacy.  ?Continue pantoprazole 40 mg daily. ?Once her constipation is effectively managed, if she continues to  have abdominal pain, bloating, nausea she should let me know.  Would consider updating imaging if persistent symptoms. ?Otherwise we will see her back in 1 year. ? ?

## 2021-07-29 ENCOUNTER — Other Ambulatory Visit: Payer: Self-pay

## 2021-07-29 ENCOUNTER — Encounter: Payer: Self-pay | Admitting: Gastroenterology

## 2021-07-29 ENCOUNTER — Telehealth: Payer: Self-pay

## 2021-07-29 ENCOUNTER — Ambulatory Visit: Payer: Medicare HMO | Admitting: Gastroenterology

## 2021-07-29 VITALS — BP 142/80 | HR 56 | Temp 97.7°F | Ht 61.0 in | Wt 175.2 lb

## 2021-07-29 DIAGNOSIS — K5900 Constipation, unspecified: Secondary | ICD-10-CM

## 2021-07-29 DIAGNOSIS — K219 Gastro-esophageal reflux disease without esophagitis: Secondary | ICD-10-CM

## 2021-07-29 MED ORDER — LUBIPROSTONE 24 MCG PO CAPS: 24.0000 ug | ORAL_CAPSULE | Freq: Two times a day (BID) | ORAL | 3 refills | Status: DC

## 2021-07-29 NOTE — Patient Instructions (Addendum)
Amitiza 24 mcg twice daily with food for constipation. RX sent to [facility]. If too expensive, we will try an alternative.  ?Samples of Linzess 254mg to take once daily as needed given today to use until we find an affordable alternative.  ?If you have persistent abdominal pain after your constipation is effectively managed, please let me know. ?Return to the office in one year or sooner if needed.  ?

## 2021-07-29 NOTE — Telephone Encounter (Signed)
PA for lubiprostone 24 mcg has been approved until 05/30/2022. Approval letter to be scanned into patient's chart.  ?

## 2021-08-05 DIAGNOSIS — M25562 Pain in left knee: Secondary | ICD-10-CM | POA: Diagnosis not present

## 2021-08-05 DIAGNOSIS — Z96652 Presence of left artificial knee joint: Secondary | ICD-10-CM | POA: Diagnosis not present

## 2021-08-05 DIAGNOSIS — T8484XA Pain due to internal orthopedic prosthetic devices, implants and grafts, initial encounter: Secondary | ICD-10-CM | POA: Diagnosis not present

## 2021-08-13 DIAGNOSIS — G8929 Other chronic pain: Secondary | ICD-10-CM | POA: Diagnosis not present

## 2021-08-13 DIAGNOSIS — F17210 Nicotine dependence, cigarettes, uncomplicated: Secondary | ICD-10-CM | POA: Diagnosis not present

## 2021-08-13 DIAGNOSIS — G894 Chronic pain syndrome: Secondary | ICD-10-CM | POA: Diagnosis not present

## 2021-08-13 DIAGNOSIS — R030 Elevated blood-pressure reading, without diagnosis of hypertension: Secondary | ICD-10-CM | POA: Diagnosis not present

## 2021-08-13 DIAGNOSIS — M47816 Spondylosis without myelopathy or radiculopathy, lumbar region: Secondary | ICD-10-CM | POA: Diagnosis not present

## 2021-08-13 DIAGNOSIS — Z6832 Body mass index (BMI) 32.0-32.9, adult: Secondary | ICD-10-CM | POA: Diagnosis not present

## 2021-08-13 DIAGNOSIS — F17200 Nicotine dependence, unspecified, uncomplicated: Secondary | ICD-10-CM | POA: Diagnosis not present

## 2021-08-13 DIAGNOSIS — Z79899 Other long term (current) drug therapy: Secondary | ICD-10-CM | POA: Diagnosis not present

## 2021-08-13 DIAGNOSIS — M545 Low back pain, unspecified: Secondary | ICD-10-CM | POA: Diagnosis not present

## 2021-08-17 ENCOUNTER — Encounter: Payer: Self-pay | Admitting: Endocrinology

## 2021-08-17 ENCOUNTER — Ambulatory Visit (INDEPENDENT_AMBULATORY_CARE_PROVIDER_SITE_OTHER): Payer: Medicare HMO | Admitting: Endocrinology

## 2021-08-17 ENCOUNTER — Other Ambulatory Visit: Payer: Self-pay

## 2021-08-17 VITALS — BP 114/76 | HR 60 | Ht 61.0 in | Wt 176.4 lb

## 2021-08-17 DIAGNOSIS — E038 Other specified hypothyroidism: Secondary | ICD-10-CM

## 2021-08-17 DIAGNOSIS — E063 Autoimmune thyroiditis: Secondary | ICD-10-CM

## 2021-08-17 DIAGNOSIS — Z79899 Other long term (current) drug therapy: Secondary | ICD-10-CM | POA: Diagnosis not present

## 2021-08-17 MED ORDER — LEVOTHYROXINE SODIUM 175 MCG PO TABS: 175.0000 ug | ORAL_TABLET | Freq: Every day | ORAL | 3 refills | Status: DC

## 2021-08-17 NOTE — Progress Notes (Signed)
? ?Subjective:  ? ? Patient ID: Victoria Deleon, female    DOB: 05-01-1966, 56 y.o.   MRN: [id number] ? ?HPI ?Pt is referred by Demetrius Revel, NP, for hypothyroidism.  Pt reports hypothyroidism was dx'ed in 2011.  she has been on prescribed thyroid hormone therapy since dx.  she has never had thyroid surgery, or XRT to the neck.  she has never been on amiodarone or lithium.  She reports hair loss, muscle cramps, weight gain, anxiety, constipation, alt heat and cold intolerance, and dry skin.  1 month ago, synthroid was reduced from 200 to 150 mcg/d, due to nausea.  With the reduction, nausea resolved.    ?Past Medical History:  ?Diagnosis Date  ? Anxiety   ? Arthritis   ? Crohn disease ([place])   ? Depression   ? Depression   ? Phreesia 03/04/2020  ? History of kidney stones   ? Hyperlipidemia   ? Phreesia 03/04/2020  ? Hypertension   ? Hypothyroidism   ? Macular degeneration   ? Nonspecific abnormal electrocardiogram (ECG) (EKG) 07/27/2018  ? Pain due to unicompartmental arthroplasty of knee ([place]) 09/06/2019  ? PTSD (post-traumatic stress disorder)   ? 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 Thyroid disease   ? Phreesia 03/04/2020  ? ? ?Past Surgical History:  ?Procedure Laterality Date  ? ABDOMINAL HYSTERECTOMY    ? APPENDECTOMY    ? BIOPSY  04/24/2018  ? Procedure: BIOPSY;  Surgeon: Daneil Dolin, MD;  [facility]: [facility];  Service: Endoscopy;;  (gastric/duodenal)  ? CHOLECYSTECTOMY    ? COLONOSCOPY WITH PROPOFOL N/A 04/24/2018  ? Dr. Gala Romney: Normal terminal ileum with negative biopsies, normal-appearing colon with random colon biopsies negative, internal hemorrhoids.  Next colonoscopy 10 years.  ? ESOPHAGOGASTRODUODENOSCOPY (EGD) WITH PROPOFOL N/A 04/24/2018  ? Dr. Gala Romney: Severe ulcerative reflux esophagitis, small hiatal hernia, small bowel biopsies negative for celiac.  ? EYE SURGERY N/A   ? Phreesia 03/04/2020  ? JOINT REPLACEMENT N/A   ? Phreesia 01/22/2020  ? MEDIAL PARTIAL KNEE  REPLACEMENT Left   ? TOTAL KNEE REVISION Left 09/10/2019  ? Procedure: LEFT TOTAL KNEE REVISION;  Surgeon: Frederik Pear, MD;  Location: [facility];  Service: Orthopedics;  Laterality: Left;  ? ? ?Social History  ? ?Socioeconomic History  ? Marital status: Married  ?  Spouse name: Fritz Pickerel   ? Number of children: Not on file  ? Years of education: Not on file  ? Highest education level: Not on file  ?Occupational History  ?  Comment: medical disability   ?Tobacco Use  ? Smoking status: Every Day  ?  Packs/day: 0.25  ?  Years: 30.00  ?  Pack years: 7.50  ?  Types: Cigarettes  ? Smokeless tobacco: Never  ?Vaping Use  ? Vaping Use: Never used  ?Substance and Sexual Activity  ? Alcohol use: Yes  ?  Comment: rare-maybe 2 glasses of wine/year  ? Drug use: Never  ? Sexual activity: Not Currently  ?  Birth control/protection: Surgical  ?  Comment: hyst  ?Other Topics Concern  ? Not on file  ?Social History Narrative  ? Lives with husband Fritz Pickerel   ?   ?   ? Enjoys: kayaking, outside events   ?   ? Diet: eats all food groups outside meat   ? Caffeine: tea 2 cups daily   ? Water: 5-6 cups daily or more  ?   ? Wears seat belt  ?  Does not use phone while driving   ? Smoke detectors   ? Data processing manager   ? Weapons safe area  ? ?Social Determinants of Health  ? ?Financial Resource Strain: High Risk  ? Difficulty of Paying Living Expenses: Hard  ?Food Insecurity: Food Insecurity Present  ? Worried About Charity fundraiser in the Last Year: Sometimes true  ? Ran Out of Food in the Last Year: Sometimes true  ?Transportation Needs: No Transportation Needs  ? Lack of Transportation (Medical): No  ? Lack of Transportation (Non-Medical): No  ?Physical Activity: Inactive  ? Days of Exercise per Week: 0 days  ? Minutes of Exercise per Session: 0 min  ?Stress: Stress Concern Present  ? Feeling of Stress : To some extent  ?Social Connections: Moderately Isolated  ? Frequency of Communication with Friends and Family: Once a week  ? Frequency of  Social Gatherings with Friends and Family: More than three times a week  ? Attends Religious Services: Never  ? Active Member of Clubs or Organizations: No  ? Attends Archivist Meetings: Never  ? Marital Status: Married  ?Intimate Partner Violence: Not At Risk  ? Fear of Current or Ex-Partner: No  ? Emotionally Abused: No  ? Physically Abused: No  ? Sexually Abused: No  ? ? ?Current Outpatient Medications on File Prior to Visit  ?Medication Sig Dispense Refill  ? atorvastatin (LIPITOR) 80 MG tablet Take 1 tablet (80 mg total) by mouth at bedtime. 90 tablet 1  ? bisacodyl (DULCOLAX) 10 MG suppository Place 1 suppository (10 mg total) rectally as needed for moderate constipation. 12 suppository 0  ? busPIRone (BUSPAR) 7.5 MG tablet Take 1 tablet (7.5 mg total) by mouth 3 (three) times daily. 90 tablet 1  ? doxepin (SINEQUAN) 25 MG capsule TAKE 1 CAPSULE BY MOUTHHONCE AT BEDTIME.    ? DULoxetine (CYMBALTA) 30 MG capsule Take 2 capsules by mouth daily.    ? EPINEPHrine 0.3 mg/0.3 mL IJ SOAJ injection Inject 0.3 mg into the muscle as needed for anaphylaxis. 1 each 2  ? FIBER PO Take 1 capsule by mouth 2 (two) times daily.     ? gabapentin (NEURONTIN) 300 MG capsule Take 300 mg by mouth 3 (three) times daily.    ? losartan (COZAAR) 50 MG tablet TAKE 1 TABLET BY MOUTH ONCE DAY. 90 tablet 0  ? lubiprostone (AMITIZA) 24 MCG capsule Take 1 capsule (24 mcg total) by mouth 2 (two) times daily with a meal. 60 capsule 3  ? methocarbamol (ROBAXIN) 500 MG tablet Take 500 mg by mouth 2 (two) times daily.    ? pantoprazole (PROTONIX) 40 MG tablet TAKE 1 TABLET BY MOUTH ONCE DAILY. 30 tablet 0  ? pregabalin (LYRICA) 100 MG capsule Take 100 mg by mouth 2 (two) times daily.    ? propranolol (INDERAL) 20 MG tablet Take 20 mg by mouth 3 (three) times daily as needed for anxiety.    ? sertraline (ZOLOFT) 50 MG tablet Take 50 mg by mouth daily.    ? traZODone (DESYREL) 50 MG tablet Take 50-100 mg by mouth at bedtime as needed.     ? ?No current facility-administered medications on file prior to visit.  ? ? ?Allergies  ?Allergen Reactions  ? Codeine Anaphylaxis  ? ? ?Family History  ?Adopted: Yes  ?Problem Relation Age of Onset  ? Colon cancer Neg Hx   ? ? ?BP 114/76 (BP Location: Left Arm, Patient Position: Sitting, Cuff Size: Normal)  Pulse 60   Ht 5' 1"  (1.549 m)   Wt 176 lb 6.4 oz (80 kg)   SpO2 93%   BMI 33.33 kg/m?  ? ? ? ?   ?Objective:  ? Physical Exam ?VS: see vs page ?GEN: no distress ?HEAD: head: no deformity ?eyes: no periorbital swelling, no proptosis ?external nose and ears are normal ?NECK: supple, thyroid is not enlarged ?CHEST WALL: no deformity ?LUNGS: clear to auscultation ?CV: reg rate and rhythm, no murmur.  ?MUSCULOSKELETAL: gait is normal and steady ?EXTEMITIES: no deformity.  no leg edema ?NEURO:  readily moves all 4's.  sensation is intact to touch on all 4's ?SKIN:  Normal texture and temperature.  No rash or suspicious lesion is visible.   ?NODES:  None palpable at the neck ?PSYCH: alert, well-oriented.  Does not appear anxious nor depressed. ? ?[place]: chronic thyroiditis, but no nodule ? ?Lab Results  ?Component Value Date  ? TSH 23.300 (H) 03/02/2021  ? ? ?I have reviewed outside records, and summarized: ?Pt was noted to have elevated TSH, and referred here.  She has had multiple dose reductions.  ?   ?Assessment & Plan:  ?Hypothyroidism: uncontrolled ?Nausea, perceived due to synthroid.  We discussed.  I advised pt to try to increase synthroid slowly.  However, I told her other sxs will prob improve with the increase, so she should try ? ?Patient Instructions  ?I have sent a prescription to your pharmacy, to increase the levothyroxine to 175 mcg per day.   ?Please redo the blood tests in approx 1 month.  You do not need to be fasting.   ?If that blood test is still off, we'll alternate 175 and 200 mcg per day.   ?.  ? ? ?

## 2021-08-17 NOTE — Patient Instructions (Addendum)
I have sent a prescription to your pharmacy, to increase the levothyroxine to 175 mcg per day.   ?Please redo the blood tests in approx 1 month.  You do not need to be fasting.   ?If that blood test is still off, we'll alternate 175 and 200 mcg per day.   ?

## 2021-08-24 DIAGNOSIS — F411 Generalized anxiety disorder: Secondary | ICD-10-CM | POA: Diagnosis not present

## 2021-08-28 DIAGNOSIS — M47816 Spondylosis without myelopathy or radiculopathy, lumbar region: Secondary | ICD-10-CM | POA: Diagnosis not present

## 2021-09-01 DIAGNOSIS — M25562 Pain in left knee: Secondary | ICD-10-CM | POA: Diagnosis not present

## 2021-09-03 ENCOUNTER — Other Ambulatory Visit (HOSPITAL_COMMUNITY): Payer: Self-pay | Admitting: Orthopedic Surgery

## 2021-09-03 DIAGNOSIS — M25562 Pain in left knee: Secondary | ICD-10-CM

## 2021-09-09 ENCOUNTER — Encounter (HOSPITAL_COMMUNITY)
Admission: RE | Admit: 2021-09-09 | Discharge: 2021-09-09 | Disposition: A | Discharge status: Home / Self Care | Payer: Medicare HMO | Source: Ambulatory Visit | Attending: Orthopedic Surgery | Admitting: Orthopedic Surgery

## 2021-09-09 DIAGNOSIS — Z96652 Presence of left artificial knee joint: Secondary | ICD-10-CM | POA: Diagnosis not present

## 2021-09-09 DIAGNOSIS — M25562 Pain in left knee: Secondary | ICD-10-CM | POA: Diagnosis not present

## 2021-09-09 DIAGNOSIS — M7989 Other specified soft tissue disorders: Secondary | ICD-10-CM | POA: Diagnosis not present

## 2021-09-09 MED ORDER — TECHNETIUM TC 99M MEDRONATE IV KIT
20.0000 | PACK | Freq: Once | INTRAVENOUS | Status: AC | PRN
  Administered 2021-09-09: 20.3 via INTRAVENOUS

## 2021-09-10 DIAGNOSIS — F411 Generalized anxiety disorder: Secondary | ICD-10-CM | POA: Diagnosis not present

## 2021-09-11 DIAGNOSIS — G8929 Other chronic pain: Secondary | ICD-10-CM | POA: Diagnosis not present

## 2021-09-11 DIAGNOSIS — Z6832 Body mass index (BMI) 32.0-32.9, adult: Secondary | ICD-10-CM | POA: Diagnosis not present

## 2021-09-11 DIAGNOSIS — M47816 Spondylosis without myelopathy or radiculopathy, lumbar region: Secondary | ICD-10-CM | POA: Diagnosis not present

## 2021-09-11 DIAGNOSIS — F17210 Nicotine dependence, cigarettes, uncomplicated: Secondary | ICD-10-CM | POA: Diagnosis not present

## 2021-09-11 DIAGNOSIS — R030 Elevated blood-pressure reading, without diagnosis of hypertension: Secondary | ICD-10-CM | POA: Diagnosis not present

## 2021-09-11 DIAGNOSIS — F17200 Nicotine dependence, unspecified, uncomplicated: Secondary | ICD-10-CM | POA: Diagnosis not present

## 2021-09-11 DIAGNOSIS — M545 Low back pain, unspecified: Secondary | ICD-10-CM | POA: Diagnosis not present

## 2021-09-11 DIAGNOSIS — Z79899 Other long term (current) drug therapy: Secondary | ICD-10-CM | POA: Diagnosis not present

## 2021-09-11 DIAGNOSIS — G894 Chronic pain syndrome: Secondary | ICD-10-CM | POA: Diagnosis not present

## 2021-09-15 ENCOUNTER — Telehealth: Payer: Medicare HMO | Admitting: Internal Medicine

## 2021-09-15 NOTE — Telephone Encounter (Signed)
Lmom for pt to return my call.  

## 2021-09-15 NOTE — Telephone Encounter (Signed)
Pt called to see if she could get samples of Linzess. Please advise. [phone] ?

## 2021-09-15 NOTE — Telephone Encounter (Signed)
She has not been able to afford Linzess, Amitiza, Trulance. Failed miralax and did not like taste of lactulose. Already on fiber gummies. ? ?Can we check her formularly to see what is on it? ? ?Can give her 2 boxes of Linzess while we try to sort out. ? ?She may have to go back to lactulose if nothing else affordable. ?

## 2021-09-15 NOTE — Telephone Encounter (Signed)
Spoke with patient and she states that the lubiprostone was also over 100 out of pocket and she couldn't afford it either. Pt states that nothing over the counter is helping. Advised patient that the Linzess samples have been scarce here lately. Wanting to know what you suggest. Pt states that she does not qualify for assistance.  ?

## 2021-09-15 NOTE — Telephone Encounter (Signed)
Spoke to [employer] and Linzess is the cheapest option of $45 dollars for a 30 day supply at a retail pharmacy. Informed pt and she wants [place] to send the Linzess 290 to [facility] on [address]  ?

## 2021-09-16 DIAGNOSIS — E039 Hypothyroidism, unspecified: Secondary | ICD-10-CM | POA: Diagnosis not present

## 2021-09-16 DIAGNOSIS — E785 Hyperlipidemia, unspecified: Secondary | ICD-10-CM | POA: Diagnosis not present

## 2021-09-16 DIAGNOSIS — I10 Essential (primary) hypertension: Secondary | ICD-10-CM | POA: Diagnosis not present

## 2021-09-16 DIAGNOSIS — R34 Anuria and oliguria: Secondary | ICD-10-CM | POA: Diagnosis not present

## 2021-09-16 DIAGNOSIS — R079 Chest pain, unspecified: Secondary | ICD-10-CM | POA: Diagnosis not present

## 2021-09-16 MED ORDER — LINACLOTIDE 290 MCG PO CAPS: 290.0000 ug | ORAL_CAPSULE | Freq: Every day | ORAL | 5 refills | Status: DC

## 2021-09-16 NOTE — Telephone Encounter (Signed)
done

## 2021-09-16 NOTE — Addendum Note (Signed)
Addended by: Mahala Menghini on: 09/16/2021 01:01 PM ? ? Modules accepted: Orders ? ?

## 2021-09-16 NOTE — Telephone Encounter (Signed)
Pt was made aware.  

## 2021-09-17 DIAGNOSIS — Z79899 Other long term (current) drug therapy: Secondary | ICD-10-CM | POA: Diagnosis not present

## 2021-09-22 NOTE — Progress Notes (Signed)
?Cardiology Office Note:   ? ?Date:  09/23/2021  ? ?ID:  Corneisha Alvi, DOB 09-30-1965, MRN [id number] ? ?PCP:  Perlie Mayo, NP  ? ?[facility] Providers ?Cardiologist:  Lenna Sciara, MD ?Referring MD: Perlie Mayo, NP  ? ?Chief Complaint/Reason for Referral: Chest pain ? ?ASSESSMENT:   ? ?1. Precordial pain   ?2. Aortic atherosclerosis ([place])   ?3. Hyperlipidemia, unspecified hyperlipidemia type   ?4. Essential hypertension   ?5. Claudication in peripheral vascular disease ([place])   ?6. Tobacco abuse   ? ? ?PLAN:   ? ?In order of problems listed above: ?1.  Chest pain: We will obtain a coronary CTA and echocardiogram to evaluate further.  If the patient has mild obstructive coronary artery disease, they will require a statin (with goal LDL < 70) and aspirin, if they have high-grade disease we will need to consider optimal medical therapy and if symptoms are refractory to medical therapy, then a cardiac catheterization with possible PCI will be pursued to alleviate symptoms.  If they have high risk disease we will proceed directly to cardiac catheterization.  Follow up in 6 months.  If her cardiac testing is reassuring then she would likely benefit from a GI evaluation. ?2.  Aortic atherosclerosis: Continue atorvastatin and start aspirin 81 mg daily.  Strict blood pressure control with goal less than 130 over 80 mmHg. ?3.  Hyperlipidemia: Based on review of PCP notes the patient's lipids are not under control.  She may need Repatha.  This is being managed by the patient's primary care provider. ?4.  Hypertension: Blood pressures well controlled on her current regimen. ?5.  Claudication: We will obtain ABIs to evaluate further. ?6.  Tobacco abuse: Counseled the patient on the need to abstain from tobacco.  When I see her next time we will obtain abdominal ultrasound and carotid Dopplers for screening purposes given her smoking history. ? ? ?Dispo:  Return in about 6 months (around 03/25/2022).  ? ?   ? ?Medication Adjustments/Labs and Tests Ordered: ?Current medicines are reviewed at length with the patient today.  Concerns regarding medicines are outlined above. ? ?The following changes have been made:    ? ?Labs/tests ordered: ?No orders of the defined types were placed in this encounter. ? ? ?Medication Changes: ?No orders of the defined types were placed in this encounter. ? ? ? ?Current medicines are reviewed at length with the patient today.  The patient does not have concerns regarding medicines. ? ? ?History of Present Illness:   ? ?FOCUSED PROBLEM LIST:   ?1.  Hypothyroidism ?2.  Hypertension ?3.  Hyperlipidemia ?4.  Ongoing tobacco abuse ?5.  GERD ?6.  Crohns disease ? ?The patient is a 56 y.o. female with the indicated medical history here for for chest pain.  The patient was seen by her primary care provider recently and had reported chest pain, fatigue, and diaphoresis.  Labs were drawn at her PCPs office which demonstrated a TSH of 64 and troponins were not elevated.  An EKG was done which demonstrated T wave depressions in V1 and V2 and sinus bradycardia.  She was prescribed PRN NTG. ? ?The patient tells me that she developed chest pain several months ago.  It can happen at rest but it is worse with exertion.  Sometimes there is a tenderness to palpation component to this as well.  She denies any significant shortness of breath, syncope, but she has had some presyncopal episodes when going from sitting to standing  quickly.  She denies any bleeding or bruising, palpitations, paroxysmal nocturnal dyspnea, orthopnea.  She has noticed some mild incidental lower extremity edema however.  She has required no emergency room visits or hospitalizations. ? ?She continues to smoke though just a very little bit.  She is receptive to quitting if she needs to. ? ? ? ?Current Medications: ?Current Meds  ?Medication Sig  ? atorvastatin (LIPITOR) 80 MG tablet Take 1 tablet (80 mg total) by mouth at bedtime.  ?  bisacodyl (DULCOLAX) 10 MG suppository Place 1 suppository (10 mg total) rectally as needed for moderate constipation.  ? busPIRone (BUSPAR) 7.5 MG tablet Take 1 tablet (7.5 mg total) by mouth 3 (three) times daily.  ? doxepin (SINEQUAN) 25 MG capsule TAKE 1 CAPSULE BY MOUTHHONCE AT BEDTIME.  ? DULoxetine (CYMBALTA) 30 MG capsule Take 2 capsules by mouth daily.  ? EPINEPHrine 0.3 mg/0.3 mL IJ SOAJ injection Inject 0.3 mg into the muscle as needed for anaphylaxis.  ? FIBER PO Take 1 capsule by mouth 2 (two) times daily.   ? gabapentin (NEURONTIN) 300 MG capsule Take 300 mg by mouth 3 (three) times daily.  ? levothyroxine (SYNTHROID) 175 MCG tablet Take 1 tablet (175 mcg total) by mouth daily.  ? linaclotide (LINZESS) 290 MCG CAPS capsule Take 1 capsule (290 mcg total) by mouth daily before breakfast.  ? losartan (COZAAR) 50 MG tablet TAKE 1 TABLET BY MOUTH ONCE DAY.  ? methocarbamol (ROBAXIN) 500 MG tablet Take 500 mg by mouth 2 (two) times daily.  ? nitroGLYCERIN (NITROSTAT) 0.4 MG SL tablet Place 1 tablet under the tongue as needed.  ? pantoprazole (PROTONIX) 40 MG tablet TAKE 1 TABLET BY MOUTH ONCE DAILY.  ? pregabalin (LYRICA) 100 MG capsule Take 100 mg by mouth 2 (two) times daily.  ? propranolol (INDERAL) 20 MG tablet Take 20 mg by mouth 3 (three) times daily as needed for anxiety.  ? sertraline (ZOLOFT) 50 MG tablet Take 50 mg by mouth daily.  ? traZODone (DESYREL) 50 MG tablet Take 50-100 mg by mouth at bedtime as needed.  ?  ? ?Allergies:    ?Codeine  ? ?Social History:   ?Social History  ? ?Tobacco Use  ? Smoking status: Every Day  ?  Packs/day: 0.25  ?  Years: 30.00  ?  Pack years: 7.50  ?  Types: Cigarettes  ? Smokeless tobacco: Never  ?Vaping Use  ? Vaping Use: Never used  ?Substance Use Topics  ? Alcohol use: Yes  ?  Comment: rare-maybe 2 glasses of wine/year  ? Drug use: Never  ?  ? ?Family Hx: ?Family History  ?Adopted: Yes  ?Problem Relation Age of Onset  ? Colon cancer Neg Hx   ?  ? ?Review of  Systems:   ?Please see the history of present illness.    ?All other systems reviewed and are negative. ?  ? ? ?EKGs/Labs/Other Test Reviewed:   ? ?EKG:  EKG performed April 2021 that I personally reviewed demonstrates sinus bradycardia; EKG performed today that I personally reviewed demonstrates sinus rhythm with incomplete right bundle branch block. ? ?Prior CV studies: ? ?TTE 2020 demonstrates ejection fraction of 60 to 65% with mild concentric left ventricular hypertrophy and diastolic dysfunction.  There are no significant valvular abnormalities. ? ?Other studies Reviewed: ?Review of the additional studies/records demonstrates:  ? ?CT abdomen pelvis 2022 demonstrates aortic atherosclerosis ? ?Recent Labs: ?03/02/2021: ALT 11; BUN 12; Creatinine, Ser 0.85; Hemoglobin 14.6; Platelets 253; Potassium 4.6; Sodium 139;  TSH 23.300  ? ?Recent Lipid Panel ?Outside hospital labs demonstrate cholesterol 172, triglycerides 95, HDL 65, and LDL of 90 April 2023 ? ?Risk Assessment/Calculations:   ? ?  ?    ? ?Physical Exam:   ? ?VS:  BP 116/82   Pulse (!) 53   Ht 5' 1"  (1.549 m)   Wt 175 lb (79.4 kg)   SpO2 98%   BMI 33.07 kg/m?    ?Wt Readings from Last 3 Encounters:  ?09/23/21 175 lb (79.4 kg)  ?08/17/21 176 lb 6.4 oz (80 kg)  ?07/29/21 175 lb 3.2 oz (79.5 kg)  ?  ?GENERAL:  No apparent distress, AOx3 ?HEENT:  No carotid bruits, +2 carotid impulses, no scleral icterus ?CAR: RRR no murmurs, gallops, rubs, or thrills ?RES:  Clear to auscultation bilaterally ?ABD:  Soft, nontender, nondistended, positive bowel sounds x 4 ?VASC:  +2 radial pulses, +2 carotid pulses, palpable pedal pulses ?NEURO:  CN 2-12 grossly intact; motor and sensory grossly intact ?PSYCH:  No active depression or anxiety ?EXT:  No edema, ecchymosis, or cyanosis ? ?Signed, ?Early Osmond, MD  ?09/23/2021 11:01 AM    ?[facility] ?[address], [place], [place]  [address] ?Phone: [phone]; Fax: [phone]  ? ?Note:  This  document was prepared using Dragon voice recognition software and may include unintentional dictation errors. ?

## 2021-09-23 ENCOUNTER — Ambulatory Visit: Payer: Medicare HMO | Admitting: Internal Medicine

## 2021-09-23 ENCOUNTER — Encounter: Payer: Self-pay | Admitting: Internal Medicine

## 2021-09-23 VITALS — BP 116/82 | HR 53 | Ht 61.0 in | Wt 175.0 lb

## 2021-09-23 DIAGNOSIS — Z720 Tobacco use: Secondary | ICD-10-CM

## 2021-09-23 DIAGNOSIS — E785 Hyperlipidemia, unspecified: Secondary | ICD-10-CM | POA: Diagnosis not present

## 2021-09-23 DIAGNOSIS — R072 Precordial pain: Secondary | ICD-10-CM | POA: Diagnosis not present

## 2021-09-23 DIAGNOSIS — I10 Essential (primary) hypertension: Secondary | ICD-10-CM

## 2021-09-23 DIAGNOSIS — I700 Atherosclerosis of aorta: Secondary | ICD-10-CM | POA: Diagnosis not present

## 2021-09-23 DIAGNOSIS — I739 Peripheral vascular disease, unspecified: Secondary | ICD-10-CM | POA: Diagnosis not present

## 2021-09-23 MED ORDER — ASPIRIN EC 81 MG PO TBEC: 81.0000 mg | DELAYED_RELEASE_TABLET | Freq: Every day | ORAL | 3 refills | Status: DC

## 2021-09-23 NOTE — Patient Instructions (Addendum)
Medication Instructions:  ?1.) start aspirin 81 mg - one tablet daily (enteric coated) ? ? ?Lab Work: ?none ? ? ?Testing/Procedures: ?Your physician has requested that you have an echocardiogram. Echocardiography is a painless test that uses sound waves to create images of your heart. It provides your doctor with information about the size and shape of your heart and how well your heart?s chambers and valves are working. This procedure takes approximately one hour. There are no restrictions for this procedure. ? ?Your physician has requested that you have an ankle brachial index (ABI). During this test an ultrasound and blood pressure cuff are used to evaluate the arteries that supply the arms and legs with blood. Allow thirty minutes for this exam. There are no restrictions or special instructions. ? ?Cardiac CTA - see instructions below. ? ?Follow-Up: ?At [facility], you and your health needs are our priority.  As part of our continuing mission to provide you with exceptional heart care, we have created designated Provider Care Teams.  These Care Teams include your primary Cardiologist (physician) and Advanced Practice Providers (APPs -  Physician Assistants and Nurse Practitioners) who all work together to provide you with the care you need, when you need it. ? ?We recommend signing up for the patient portal called "MyChart".  Sign up information is provided on this After Visit Summary.  MyChart is used to connect with patients for Virtual Visits (Telemedicine).  Patients are able to view lab/test results, encounter notes, upcoming appointments, etc.  Non-urgent messages can be sent to your provider as well.   ?To learn more about what you can do with MyChart, go to [email or web address].   ? ?Your next appointment:   ?6 month(s) ? ?The format for your next appointment:   ?In Person ? ?Provider:   ?Early Osmond, MD   ? ? ?Other Instructions ? ? ?Your cardiac CT will be scheduled at  ?[facility] ?[address] ?[place], [place] [address] ?[phone] ? ?Please arrive at the [facility] (Entrance C2) of [facility] 30 minutes prior to test start time. ?You can use the FREE valet parking offered at entrance C (encouraged to control the heart rate for the test)  ?Proceed to the [facility] Radiology Department (first floor) to check-in and test prep. ? ?All radiology patients and guests should use entrance C2 at [facility], accessed from [facility], even though the hospital's physical address listed is [address]. ? ? ? ? ?Please follow these instructions carefully (unless otherwise directed): ? ? ?On the Night Before the Test: ?Be sure to Drink plenty of water. ?Do not consume any caffeinated/decaffeinated beverages or chocolate 12 hours prior to your test. ?Do not take any antihistamines 12 hours prior to your test. ? ?On the Day of the Test: ?Drink plenty of water until 1 hour prior to the test. ?Do not eat any food 4 hours prior to the test. ?You may take your regular medications prior to the test.  ?FEMALES- please wear underwire-free bra if available, avoid dresses & tight clothing ?     ?After the Test: ?Drink plenty of water. ?After receiving IV contrast, you may experience a mild flushed feeling. This is normal. ?On occasion, you may experience a mild rash up to 24 hours after the test. This is not dangerous. If this occurs, you can take Benadryl 25 mg and increase your fluid intake. ?If you experience trouble breathing, this can be  serious. If it is severe call 911 IMMEDIATELY. If it is mild, please call our office. ?If you take any of these medications: Glipizide/Metformin, Avandament, Glucavance, please do not take 48 hours after completing test unless otherwise instructed. ? ?We will call to schedule your test 2-4 weeks out understanding that some insurance companies will need an authorization prior to the service being  performed.  ? ?For non-scheduling related questions, please contact the cardiac imaging nurse navigator should you have any questions/concerns: ?Marchia Bond, Cardiac Imaging Nurse Navigator ?Gordy Clement, Cardiac Imaging Nurse Navigator ?[facility] ?Direct Office Dial: [phone]  ? ?For scheduling needs, including cancellations and rescheduling, please call [place], [phone]. ? ? ?

## 2021-09-28 ENCOUNTER — Ambulatory Visit: Payer: Medicare HMO | Admitting: Endocrinology

## 2021-09-28 ENCOUNTER — Other Ambulatory Visit: Payer: Self-pay | Admitting: Gastroenterology

## 2021-09-28 NOTE — Telephone Encounter (Signed)
Last ov 07/29/2021 ?

## 2021-09-29 DIAGNOSIS — M25562 Pain in left knee: Secondary | ICD-10-CM | POA: Diagnosis not present

## 2021-09-30 DIAGNOSIS — Z87442 Personal history of urinary calculi: Secondary | ICD-10-CM | POA: Diagnosis not present

## 2021-09-30 DIAGNOSIS — R3911 Hesitancy of micturition: Secondary | ICD-10-CM | POA: Diagnosis not present

## 2021-09-30 DIAGNOSIS — Z720 Tobacco use: Secondary | ICD-10-CM | POA: Diagnosis not present

## 2021-09-30 DIAGNOSIS — R3989 Other symptoms and signs involving the genitourinary system: Secondary | ICD-10-CM | POA: Diagnosis not present

## 2021-09-30 DIAGNOSIS — E039 Hypothyroidism, unspecified: Secondary | ICD-10-CM | POA: Diagnosis not present

## 2021-09-30 DIAGNOSIS — K5090 Crohn's disease, unspecified, without complications: Secondary | ICD-10-CM | POA: Diagnosis not present

## 2021-10-02 ENCOUNTER — Other Ambulatory Visit: Payer: Self-pay | Admitting: Internal Medicine

## 2021-10-02 DIAGNOSIS — R3911 Hesitancy of micturition: Secondary | ICD-10-CM

## 2021-10-02 DIAGNOSIS — R39198 Other difficulties with micturition: Secondary | ICD-10-CM

## 2021-10-07 ENCOUNTER — Ambulatory Visit (HOSPITAL_COMMUNITY)
Admission: RE | Admit: 2021-10-07 | Discharge: 2021-10-07 | Disposition: A | Discharge status: Home / Self Care | Payer: Medicare HMO | Source: Ambulatory Visit | Attending: Internal Medicine | Admitting: Internal Medicine

## 2021-10-07 ENCOUNTER — Telehealth: Payer: Self-pay | Admitting: Internal Medicine

## 2021-10-07 ENCOUNTER — Encounter (HOSPITAL_COMMUNITY): Payer: Self-pay

## 2021-10-07 DIAGNOSIS — R072 Precordial pain: Secondary | ICD-10-CM | POA: Diagnosis not present

## 2021-10-07 MED ORDER — IOHEXOL 350 MG/ML SOLN
100.0000 mL | Freq: Once | INTRAVENOUS | Status: AC | PRN
  Administered 2021-10-07: 100 mL via INTRAVENOUS

## 2021-10-07 MED ORDER — NITROGLYCERIN 0.4 MG SL SUBL
0.8000 mg | SUBLINGUAL_TABLET | Freq: Once | SUBLINGUAL | Status: AC
  Administered 2021-10-07: 0.8 mg via SUBLINGUAL

## 2021-10-07 MED ORDER — NITROGLYCERIN 0.4 MG SL SUBL
SUBLINGUAL_TABLET | SUBLINGUAL | Status: AC
  Filled 2021-10-07: qty 2

## 2021-10-07 NOTE — Telephone Encounter (Signed)
Patient states she is returning a call, but she is unsure of who called or what it may have been regarding. Please advise. ?

## 2021-10-07 NOTE — Telephone Encounter (Signed)
Spoke with pt and advised RN does not see where anyone has tried to call pt.  Pt does have an appointment for an echo tomorrow 10/08/2021.  Pt advised if important someone will contact her again.  Pt verbalizes understanding and thanked Therapist, sports for the call. ?

## 2021-10-08 ENCOUNTER — Other Ambulatory Visit: Payer: Self-pay | Admitting: Internal Medicine

## 2021-10-08 ENCOUNTER — Ambulatory Visit (HOSPITAL_COMMUNITY): Payer: Medicare HMO | Attending: Cardiology

## 2021-10-08 DIAGNOSIS — Z720 Tobacco use: Secondary | ICD-10-CM

## 2021-10-08 DIAGNOSIS — I739 Peripheral vascular disease, unspecified: Secondary | ICD-10-CM

## 2021-10-08 DIAGNOSIS — R072 Precordial pain: Secondary | ICD-10-CM | POA: Diagnosis not present

## 2021-10-08 DIAGNOSIS — I700 Atherosclerosis of aorta: Secondary | ICD-10-CM | POA: Insufficient documentation

## 2021-10-08 LAB — ECHOCARDIOGRAM COMPLETE
Area-P 1/2: 4.29 cm2
S' Lateral: 2.4 cm

## 2021-10-08 MED ORDER — PERFLUTREN LIPID MICROSPHERE
1.0000 mL | INTRAVENOUS | Status: AC | PRN
  Administered 2021-10-08: 2 mL via INTRAVENOUS

## 2021-10-09 ENCOUNTER — Telehealth: Payer: Self-pay | Admitting: Internal Medicine

## 2021-10-09 NOTE — Telephone Encounter (Signed)
Pt returning call regarding test results. Please advise ?

## 2021-10-09 NOTE — Telephone Encounter (Signed)
Pt aware of echo and cta results ./cy ? ?Let patient know echo shows normal heart function without significant valvular issues. ? ?Coronary CTA demonstrates only mild obstructive coronary artery disease.  This would not cause chest pain.  She needs to follow-up with her primary care provider regarding noncardiac causes of chest pain.  Continue aspirin and statin. ?

## 2021-10-14 ENCOUNTER — Ambulatory Visit (HOSPITAL_COMMUNITY)
Admission: RE | Admit: 2021-10-14 | Discharge: 2021-10-14 | Disposition: A | Discharge status: Home / Self Care | Payer: Medicare HMO | Source: Ambulatory Visit | Attending: Cardiology | Admitting: Cardiology

## 2021-10-14 DIAGNOSIS — F411 Generalized anxiety disorder: Secondary | ICD-10-CM | POA: Diagnosis not present

## 2021-10-14 DIAGNOSIS — Z720 Tobacco use: Secondary | ICD-10-CM | POA: Insufficient documentation

## 2021-10-14 DIAGNOSIS — I739 Peripheral vascular disease, unspecified: Secondary | ICD-10-CM | POA: Insufficient documentation

## 2021-10-16 DIAGNOSIS — F411 Generalized anxiety disorder: Secondary | ICD-10-CM | POA: Diagnosis not present

## 2021-10-19 ENCOUNTER — Emergency Department (HOSPITAL_COMMUNITY)
Admission: EM | Admit: 2021-10-19 | Discharge: 2021-10-19 | Discharge status: Against Medical Advice | Payer: Medicare HMO | Source: Home / Self Care

## 2021-10-19 DIAGNOSIS — F17200 Nicotine dependence, unspecified, uncomplicated: Secondary | ICD-10-CM | POA: Diagnosis not present

## 2021-10-19 DIAGNOSIS — G8929 Other chronic pain: Secondary | ICD-10-CM | POA: Diagnosis not present

## 2021-10-19 DIAGNOSIS — R030 Elevated blood-pressure reading, without diagnosis of hypertension: Secondary | ICD-10-CM | POA: Diagnosis not present

## 2021-10-19 DIAGNOSIS — G894 Chronic pain syndrome: Secondary | ICD-10-CM | POA: Diagnosis not present

## 2021-10-19 DIAGNOSIS — Z6833 Body mass index (BMI) 33.0-33.9, adult: Secondary | ICD-10-CM | POA: Diagnosis not present

## 2021-10-19 DIAGNOSIS — M545 Low back pain, unspecified: Secondary | ICD-10-CM | POA: Diagnosis not present

## 2021-10-19 DIAGNOSIS — M47816 Spondylosis without myelopathy or radiculopathy, lumbar region: Secondary | ICD-10-CM | POA: Diagnosis not present

## 2021-10-19 DIAGNOSIS — F17210 Nicotine dependence, cigarettes, uncomplicated: Secondary | ICD-10-CM | POA: Diagnosis not present

## 2021-10-19 DIAGNOSIS — Z79899 Other long term (current) drug therapy: Secondary | ICD-10-CM | POA: Diagnosis not present

## 2021-10-21 ENCOUNTER — Ambulatory Visit (HOSPITAL_COMMUNITY)
Admission: RE | Admit: 2021-10-21 | Discharge: 2021-10-21 | Disposition: A | Discharge status: Home / Self Care | Payer: Medicare HMO | Source: Ambulatory Visit | Attending: Internal Medicine | Admitting: Internal Medicine

## 2021-10-21 ENCOUNTER — Ambulatory Visit: Payer: Medicare HMO

## 2021-10-21 DIAGNOSIS — R3911 Hesitancy of micturition: Secondary | ICD-10-CM | POA: Diagnosis not present

## 2021-10-21 DIAGNOSIS — Z720 Tobacco use: Secondary | ICD-10-CM | POA: Insufficient documentation

## 2021-10-21 DIAGNOSIS — R39198 Other difficulties with micturition: Secondary | ICD-10-CM | POA: Diagnosis not present

## 2021-10-21 DIAGNOSIS — R338 Other retention of urine: Secondary | ICD-10-CM | POA: Diagnosis not present

## 2021-10-21 DIAGNOSIS — Z126 Encounter for screening for malignant neoplasm of bladder: Secondary | ICD-10-CM | POA: Diagnosis not present

## 2021-10-26 DIAGNOSIS — Z79899 Other long term (current) drug therapy: Secondary | ICD-10-CM | POA: Diagnosis not present

## 2021-10-27 ENCOUNTER — Other Ambulatory Visit: Payer: Self-pay | Admitting: Family Medicine

## 2021-10-27 DIAGNOSIS — I10 Essential (primary) hypertension: Secondary | ICD-10-CM

## 2021-10-28 DIAGNOSIS — Z720 Tobacco use: Secondary | ICD-10-CM | POA: Diagnosis not present

## 2021-10-28 DIAGNOSIS — E039 Hypothyroidism, unspecified: Secondary | ICD-10-CM | POA: Diagnosis not present

## 2021-10-28 DIAGNOSIS — K5090 Crohn's disease, unspecified, without complications: Secondary | ICD-10-CM | POA: Diagnosis not present

## 2021-10-28 DIAGNOSIS — M79669 Pain in unspecified lower leg: Secondary | ICD-10-CM | POA: Diagnosis not present

## 2021-11-04 ENCOUNTER — Other Ambulatory Visit: Payer: Self-pay | Admitting: Internal Medicine

## 2021-11-04 DIAGNOSIS — M79669 Pain in unspecified lower leg: Secondary | ICD-10-CM

## 2021-11-04 DIAGNOSIS — I824Y2 Acute embolism and thrombosis of unspecified deep veins of left proximal lower extremity: Secondary | ICD-10-CM

## 2021-11-05 ENCOUNTER — Ambulatory Visit
Admission: RE | Admit: 2021-11-05 | Discharge: 2021-11-05 | Disposition: A | Discharge status: Home / Self Care | Payer: Medicare HMO | Source: Ambulatory Visit | Attending: Internal Medicine | Admitting: Internal Medicine

## 2021-11-05 DIAGNOSIS — M79662 Pain in left lower leg: Secondary | ICD-10-CM | POA: Diagnosis not present

## 2021-11-05 DIAGNOSIS — M79669 Pain in unspecified lower leg: Secondary | ICD-10-CM

## 2021-11-05 DIAGNOSIS — I824Y2 Acute embolism and thrombosis of unspecified deep veins of left proximal lower extremity: Secondary | ICD-10-CM

## 2021-11-09 ENCOUNTER — Other Ambulatory Visit: Payer: Self-pay | Admitting: Internal Medicine

## 2021-11-11 DIAGNOSIS — F411 Generalized anxiety disorder: Secondary | ICD-10-CM | POA: Diagnosis not present

## 2021-11-11 DIAGNOSIS — F410 Panic disorder [episodic paroxysmal anxiety] without agoraphobia: Secondary | ICD-10-CM | POA: Diagnosis not present

## 2021-11-11 DIAGNOSIS — E039 Hypothyroidism, unspecified: Secondary | ICD-10-CM | POA: Diagnosis not present

## 2021-11-18 DIAGNOSIS — F411 Generalized anxiety disorder: Secondary | ICD-10-CM | POA: Diagnosis not present

## 2021-11-25 DIAGNOSIS — M545 Low back pain, unspecified: Secondary | ICD-10-CM | POA: Diagnosis not present

## 2021-11-25 DIAGNOSIS — Z6832 Body mass index (BMI) 32.0-32.9, adult: Secondary | ICD-10-CM | POA: Diagnosis not present

## 2021-11-25 DIAGNOSIS — Z79899 Other long term (current) drug therapy: Secondary | ICD-10-CM | POA: Diagnosis not present

## 2021-11-25 DIAGNOSIS — M47816 Spondylosis without myelopathy or radiculopathy, lumbar region: Secondary | ICD-10-CM | POA: Diagnosis not present

## 2021-11-25 DIAGNOSIS — F17200 Nicotine dependence, unspecified, uncomplicated: Secondary | ICD-10-CM | POA: Diagnosis not present

## 2021-11-25 DIAGNOSIS — F17210 Nicotine dependence, cigarettes, uncomplicated: Secondary | ICD-10-CM | POA: Diagnosis not present

## 2021-11-25 DIAGNOSIS — G894 Chronic pain syndrome: Secondary | ICD-10-CM | POA: Diagnosis not present

## 2021-11-25 DIAGNOSIS — G8929 Other chronic pain: Secondary | ICD-10-CM | POA: Diagnosis not present

## 2021-11-25 DIAGNOSIS — R030 Elevated blood-pressure reading, without diagnosis of hypertension: Secondary | ICD-10-CM | POA: Diagnosis not present

## 2021-11-27 ENCOUNTER — Ambulatory Visit (HOSPITAL_BASED_OUTPATIENT_CLINIC_OR_DEPARTMENT_OTHER): Payer: Medicare HMO | Admitting: Internal Medicine

## 2021-11-30 DIAGNOSIS — F411 Generalized anxiety disorder: Secondary | ICD-10-CM | POA: Diagnosis not present

## 2021-11-30 DIAGNOSIS — Z79899 Other long term (current) drug therapy: Secondary | ICD-10-CM | POA: Diagnosis not present

## 2021-12-07 DIAGNOSIS — G8929 Other chronic pain: Secondary | ICD-10-CM | POA: Diagnosis not present

## 2021-12-07 DIAGNOSIS — R030 Elevated blood-pressure reading, without diagnosis of hypertension: Secondary | ICD-10-CM | POA: Diagnosis not present

## 2021-12-07 DIAGNOSIS — G894 Chronic pain syndrome: Secondary | ICD-10-CM | POA: Diagnosis not present

## 2021-12-07 DIAGNOSIS — M47816 Spondylosis without myelopathy or radiculopathy, lumbar region: Secondary | ICD-10-CM | POA: Diagnosis not present

## 2021-12-07 DIAGNOSIS — Z6833 Body mass index (BMI) 33.0-33.9, adult: Secondary | ICD-10-CM | POA: Diagnosis not present

## 2021-12-07 DIAGNOSIS — F17210 Nicotine dependence, cigarettes, uncomplicated: Secondary | ICD-10-CM | POA: Diagnosis not present

## 2021-12-07 DIAGNOSIS — Z79899 Other long term (current) drug therapy: Secondary | ICD-10-CM | POA: Diagnosis not present

## 2021-12-07 DIAGNOSIS — M545 Low back pain, unspecified: Secondary | ICD-10-CM | POA: Diagnosis not present

## 2021-12-07 DIAGNOSIS — F17200 Nicotine dependence, unspecified, uncomplicated: Secondary | ICD-10-CM | POA: Diagnosis not present

## 2021-12-09 DIAGNOSIS — Z79899 Other long term (current) drug therapy: Secondary | ICD-10-CM | POA: Diagnosis not present

## 2021-12-14 DIAGNOSIS — M47816 Spondylosis without myelopathy or radiculopathy, lumbar region: Secondary | ICD-10-CM | POA: Diagnosis not present

## 2021-12-21 DIAGNOSIS — F411 Generalized anxiety disorder: Secondary | ICD-10-CM | POA: Diagnosis not present

## 2022-01-06 ENCOUNTER — Other Ambulatory Visit (HOSPITAL_COMMUNITY): Payer: Self-pay | Admitting: Internal Medicine

## 2022-01-06 ENCOUNTER — Other Ambulatory Visit: Payer: Self-pay | Admitting: Internal Medicine

## 2022-01-06 DIAGNOSIS — M25472 Effusion, left ankle: Secondary | ICD-10-CM

## 2022-01-06 DIAGNOSIS — M47816 Spondylosis without myelopathy or radiculopathy, lumbar region: Secondary | ICD-10-CM | POA: Diagnosis not present

## 2022-01-06 DIAGNOSIS — E039 Hypothyroidism, unspecified: Secondary | ICD-10-CM | POA: Diagnosis not present

## 2022-01-06 DIAGNOSIS — R030 Elevated blood-pressure reading, without diagnosis of hypertension: Secondary | ICD-10-CM | POA: Diagnosis not present

## 2022-01-06 DIAGNOSIS — G894 Chronic pain syndrome: Secondary | ICD-10-CM | POA: Diagnosis not present

## 2022-01-06 DIAGNOSIS — M4726 Other spondylosis with radiculopathy, lumbar region: Secondary | ICD-10-CM | POA: Diagnosis not present

## 2022-01-06 DIAGNOSIS — F17210 Nicotine dependence, cigarettes, uncomplicated: Secondary | ICD-10-CM | POA: Diagnosis not present

## 2022-01-06 DIAGNOSIS — G8929 Other chronic pain: Secondary | ICD-10-CM | POA: Diagnosis not present

## 2022-01-06 DIAGNOSIS — Z79899 Other long term (current) drug therapy: Secondary | ICD-10-CM | POA: Diagnosis not present

## 2022-01-06 DIAGNOSIS — Z6831 Body mass index (BMI) 31.0-31.9, adult: Secondary | ICD-10-CM | POA: Diagnosis not present

## 2022-01-06 DIAGNOSIS — M19072 Primary osteoarthritis, left ankle and foot: Secondary | ICD-10-CM | POA: Diagnosis not present

## 2022-01-06 DIAGNOSIS — M545 Low back pain, unspecified: Secondary | ICD-10-CM | POA: Diagnosis not present

## 2022-01-06 DIAGNOSIS — F17200 Nicotine dependence, unspecified, uncomplicated: Secondary | ICD-10-CM | POA: Diagnosis not present

## 2022-01-07 DIAGNOSIS — M47816 Spondylosis without myelopathy or radiculopathy, lumbar region: Secondary | ICD-10-CM | POA: Diagnosis not present

## 2022-01-07 DIAGNOSIS — M47817 Spondylosis without myelopathy or radiculopathy, lumbosacral region: Secondary | ICD-10-CM | POA: Diagnosis not present

## 2022-01-08 ENCOUNTER — Ambulatory Visit (HOSPITAL_COMMUNITY)
Admission: RE | Admit: 2022-01-08 | Discharge: 2022-01-08 | Disposition: A | Discharge status: Home / Self Care | Payer: Medicare HMO | Source: Ambulatory Visit | Attending: Internal Medicine | Admitting: Internal Medicine

## 2022-01-08 DIAGNOSIS — M25472 Effusion, left ankle: Secondary | ICD-10-CM | POA: Diagnosis not present

## 2022-01-08 DIAGNOSIS — R600 Localized edema: Secondary | ICD-10-CM | POA: Diagnosis not present

## 2022-01-11 DIAGNOSIS — Z79899 Other long term (current) drug therapy: Secondary | ICD-10-CM | POA: Diagnosis not present

## 2022-01-11 DIAGNOSIS — F411 Generalized anxiety disorder: Secondary | ICD-10-CM | POA: Diagnosis not present

## 2022-01-12 DIAGNOSIS — F411 Generalized anxiety disorder: Secondary | ICD-10-CM | POA: Diagnosis not present

## 2022-01-25 ENCOUNTER — Other Ambulatory Visit (HOSPITAL_COMMUNITY): Payer: Self-pay | Admitting: Internal Medicine

## 2022-01-25 DIAGNOSIS — M19072 Primary osteoarthritis, left ankle and foot: Secondary | ICD-10-CM

## 2022-01-27 ENCOUNTER — Ambulatory Visit (HOSPITAL_COMMUNITY)
Admission: RE | Admit: 2022-01-27 | Discharge: 2022-01-27 | Disposition: A | Discharge status: Home / Self Care | Payer: Medicare HMO | Source: Ambulatory Visit | Attending: Internal Medicine | Admitting: Internal Medicine

## 2022-01-27 DIAGNOSIS — M7752 Other enthesopathy of left foot: Secondary | ICD-10-CM | POA: Diagnosis not present

## 2022-01-27 DIAGNOSIS — M19072 Primary osteoarthritis, left ankle and foot: Secondary | ICD-10-CM | POA: Insufficient documentation

## 2022-02-02 ENCOUNTER — Other Ambulatory Visit: Payer: Self-pay | Admitting: Family Medicine

## 2022-02-02 DIAGNOSIS — I10 Essential (primary) hypertension: Secondary | ICD-10-CM

## 2022-02-02 DIAGNOSIS — E039 Hypothyroidism, unspecified: Secondary | ICD-10-CM | POA: Diagnosis not present

## 2022-02-12 ENCOUNTER — Ambulatory Visit: Payer: Medicare HMO | Attending: Internal Medicine | Admitting: Internal Medicine

## 2022-02-15 DIAGNOSIS — R413 Other amnesia: Secondary | ICD-10-CM | POA: Diagnosis not present

## 2022-02-15 DIAGNOSIS — K5090 Crohn's disease, unspecified, without complications: Secondary | ICD-10-CM | POA: Diagnosis not present

## 2022-02-15 DIAGNOSIS — F411 Generalized anxiety disorder: Secondary | ICD-10-CM | POA: Diagnosis not present

## 2022-02-15 DIAGNOSIS — E039 Hypothyroidism, unspecified: Secondary | ICD-10-CM | POA: Diagnosis not present

## 2022-02-16 ENCOUNTER — Encounter: Payer: Self-pay | Admitting: Internal Medicine

## 2022-02-16 DIAGNOSIS — F411 Generalized anxiety disorder: Secondary | ICD-10-CM | POA: Diagnosis not present

## 2022-02-17 DIAGNOSIS — F17200 Nicotine dependence, unspecified, uncomplicated: Secondary | ICD-10-CM | POA: Diagnosis not present

## 2022-02-17 DIAGNOSIS — M545 Low back pain, unspecified: Secondary | ICD-10-CM | POA: Diagnosis not present

## 2022-02-17 DIAGNOSIS — G894 Chronic pain syndrome: Secondary | ICD-10-CM | POA: Diagnosis not present

## 2022-02-17 DIAGNOSIS — M47816 Spondylosis without myelopathy or radiculopathy, lumbar region: Secondary | ICD-10-CM | POA: Diagnosis not present

## 2022-02-17 DIAGNOSIS — R030 Elevated blood-pressure reading, without diagnosis of hypertension: Secondary | ICD-10-CM | POA: Diagnosis not present

## 2022-02-17 DIAGNOSIS — Z6830 Body mass index (BMI) 30.0-30.9, adult: Secondary | ICD-10-CM | POA: Diagnosis not present

## 2022-02-17 DIAGNOSIS — Z79899 Other long term (current) drug therapy: Secondary | ICD-10-CM | POA: Diagnosis not present

## 2022-02-17 DIAGNOSIS — F17210 Nicotine dependence, cigarettes, uncomplicated: Secondary | ICD-10-CM | POA: Diagnosis not present

## 2022-02-17 DIAGNOSIS — G8929 Other chronic pain: Secondary | ICD-10-CM | POA: Diagnosis not present

## 2022-02-23 ENCOUNTER — Other Ambulatory Visit: Payer: Self-pay

## 2022-02-23 DIAGNOSIS — I10 Essential (primary) hypertension: Secondary | ICD-10-CM

## 2022-03-02 ENCOUNTER — Telehealth: Payer: Self-pay

## 2022-03-02 ENCOUNTER — Telehealth: Payer: Self-pay | Admitting: Gastroenterology

## 2022-03-02 ENCOUNTER — Other Ambulatory Visit: Payer: Self-pay

## 2022-03-02 DIAGNOSIS — I10 Essential (primary) hypertension: Secondary | ICD-10-CM

## 2022-03-02 MED ORDER — LINACLOTIDE 290 MCG PO CAPS: ORAL_CAPSULE | ORAL | 3 refills | Status: DC

## 2022-03-02 NOTE — Telephone Encounter (Signed)
Can we verify with patient if she wants it sent to mail order? Previously she has been getting 30 day from local pharmacy because it was cheaper for her. See previously telephone notes.

## 2022-03-02 NOTE — Telephone Encounter (Signed)
(  see other note)

## 2022-03-02 NOTE — Addendum Note (Signed)
Addended by: Mahala Menghini on: 03/02/2022 12:16 PM   Modules accepted: Orders

## 2022-03-02 NOTE — Telephone Encounter (Signed)
Lmom for pt to return my call.  

## 2022-03-02 NOTE — Telephone Encounter (Signed)
Refill request received for Linzess 290 mcg capsule from [facility]. Patient was last seen on 07/29/2021.

## 2022-03-02 NOTE — Telephone Encounter (Signed)
Spoke with patient and she is [facility]

## 2022-03-02 NOTE — Telephone Encounter (Signed)
Pt returning call. [phone]

## 2022-03-04 ENCOUNTER — Other Ambulatory Visit: Payer: Self-pay

## 2022-03-04 DIAGNOSIS — I10 Essential (primary) hypertension: Secondary | ICD-10-CM

## 2022-03-11 ENCOUNTER — Telehealth (HOSPITAL_COMMUNITY): Payer: Self-pay

## 2022-03-11 NOTE — Telephone Encounter (Signed)
Called pt to schedule appt with Dr Nehemiah Settle no answer left vm

## 2022-03-12 NOTE — Telephone Encounter (Signed)
Pt scheduled  

## 2022-03-24 ENCOUNTER — Encounter (HOSPITAL_COMMUNITY): Payer: Self-pay | Admitting: Psychiatry

## 2022-03-24 ENCOUNTER — Ambulatory Visit (INDEPENDENT_AMBULATORY_CARE_PROVIDER_SITE_OTHER): Payer: Medicare HMO | Admitting: Psychiatry

## 2022-03-24 DIAGNOSIS — F331 Major depressive disorder, recurrent, moderate: Secondary | ICD-10-CM | POA: Diagnosis not present

## 2022-03-24 DIAGNOSIS — M545 Low back pain, unspecified: Secondary | ICD-10-CM | POA: Diagnosis not present

## 2022-03-24 DIAGNOSIS — Z9151 Personal history of suicidal behavior: Secondary | ICD-10-CM | POA: Diagnosis not present

## 2022-03-24 DIAGNOSIS — K5000 Crohn's disease of small intestine without complications: Secondary | ICD-10-CM | POA: Diagnosis not present

## 2022-03-24 DIAGNOSIS — F17200 Nicotine dependence, unspecified, uncomplicated: Secondary | ICD-10-CM | POA: Diagnosis not present

## 2022-03-24 DIAGNOSIS — E038 Other specified hypothyroidism: Secondary | ICD-10-CM

## 2022-03-24 DIAGNOSIS — Z79899 Other long term (current) drug therapy: Secondary | ICD-10-CM

## 2022-03-24 DIAGNOSIS — E063 Autoimmune thyroiditis: Secondary | ICD-10-CM | POA: Diagnosis not present

## 2022-03-24 DIAGNOSIS — F4310 Post-traumatic stress disorder, unspecified: Secondary | ICD-10-CM | POA: Diagnosis not present

## 2022-03-24 DIAGNOSIS — F3341 Major depressive disorder, recurrent, in partial remission: Secondary | ICD-10-CM | POA: Insufficient documentation

## 2022-03-24 DIAGNOSIS — F4000 Agoraphobia, unspecified: Secondary | ICD-10-CM | POA: Diagnosis not present
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
## 2022-03-24 NOTE — Patient Instructions (Signed)
We discontinued your zoloft today. Next week, you can discontinue the trazodone. We do expect that in the short run you may experience some worsened anxiety and worsening sleep but once we have reduced your overall medication burden this will open up more options for streamlining your regimen. Dr. Nehemiah Settle has placed a referral for starting psychotherapy again.

## 2022-04-01 NOTE — Progress Notes (Signed)
Cardiology Office Note:    Date:  04/07/2022   ID:  Victoria Deleon, DOB 02-May-1966, MRN [id number]  PCP:  [facility], [place], [place] Prov[facility] Cardiologist:  Lenna Sciara, MD Referring MD: Perlie Mayo, NP   Chief Complaint/Reason for Referral: Chest pain  ASSESSMENT:    1. Coronary artery disease involving native coronary artery of native heart without angina pectoris   2. Aortic atherosclerosis ([place])   3. Hyperlipidemia LDL goal <70   4. Primary hypertension   5. Claudication in peripheral vascular disease ([place])   6. Tobacco abuse      PLAN:    In order of problems listed above: 1.  Coronary artery disease: Mild coronary artery disease on CTA.  Continue aspirin, statin, and strict blood pressure control. 2.  Aortic atherosclerosis: Continue aspirin, statin, and strict blood pressure control. 3.  Hyperlipidemia: On Lipitor 80 and Zetia.  Check lipid panel, LFTs, and LP(a) today.  If above goal, will pursue pharmacy referral. 4.  Hypertension: Blood pressures are well controlled on her current regimen. 5.  Claudication: Reassuring ABIs recently. 6.  Tobacco abuse: Stressed need to quit smoking entirely.  She had a CT scan in 2022 which demonstrated no abdominal aortic aneurysm.  Dispo:  Return in about 9 months (around 01/06/2023).      Medication Adjustments/Labs and Tests Ordered: Current medicines are reviewed at length with the patient today.  Concerns regarding medicines are outlined above.  The following changes have been made:     Labs/tests ordered: Orders Placed This Encounter  Procedures   Lipid panel   Hepatic function panel   Lipoprotein A (LPA)    Medication Changes: Meds ordered this encounter  Medications   atorvastatin (LIPITOR) 80 MG tablet    Sig: Take 1 tablet (80 mg total) by mouth at bedtime.    Dispense:  90 tablet    Refill:  3   aspirin EC 81 MG tablet    Sig: Take 1 tablet (81 mg total) by mouth daily. Swallow  whole.     Current medicines are reviewed at length with the patient today.  The patient does not have concerns regarding medicines.   History of Present Illness:    FOCUSED PROBLEM LIST:   1.  Hypothyroidism 2.  Hypertension 3.  Hyperlipidemia 4.  Ongoing tobacco abuse 5.  GERD 6.  Crohns disease 7.  Aortic atherosclerosis on CT abdomen pelvis 2022 8.  Mild nonobstructive coronary artery disease on coronary CTA 2023  April 2023 consultation: The patient is a 56 y.o. female with the indicated medical history here for for chest pain.  The patient was seen by her primary care provider recently and had reported chest pain, fatigue, and diaphoresis.  Labs were drawn at her PCPs office which demonstrated a TSH of 64 and troponins were not elevated.  An EKG was done which demonstrated T wave depressions in V1 and V2 and sinus bradycardia.  She was prescribed PRN NTG.  The patient tells me that she developed chest pain several months ago.  It can happen at rest but it is worse with exertion.  Sometimes there is a tenderness to palpation component to this as well.  She denies any significant shortness of breath, syncope, but she has had some presyncopal episodes when going from sitting to standing quickly.  She denies any bleeding or bruising, palpitations, paroxysmal nocturnal dyspnea, orthopnea.  She has noticed some mild incidental lower extremity edema however.  She has required  no emergency room visits or hospitalizations.  She continues to smoke though just a very little bit.  She is receptive to quitting if she needs to.  Plan: Obtain coronary CTA and echocardiogram due to chest pain; start aspirin 81 mg daily due to aortic atherosclerosis, check ABIs for claudication.  Today: In the interim the patient had a coronary CTA which demonstrated mild obstructive coronary artery disease, reassuring echocardiogram, and no significant abnormality on her ABIs.  The patient is doing well.  She  continues to smoke but only a very small amount in the mornings.  She is taking care of her grandchildren during the day and this is keeping her busy.  She denies any exertional angina.  She occasionally gets chest discomfort when she gets anxious.  She has required no emergency room visits or hospitalizations.  She did not start aspirin 81 mg as instructed last time.  She is taking her Zetia every other day rather than daily.    Current Medications: Current Meds  Medication Sig   aspirin EC 81 MG tablet Take 1 tablet (81 mg total) by mouth daily. Swallow whole.   bisacodyl (DULCOLAX) 10 MG suppository Place 1 suppository (10 mg total) rectally as needed for moderate constipation.   clonazePAM (KLONOPIN) 0.5 MG tablet Take 0.5 mg by mouth at bedtime as needed for anxiety.   doxepin (SINEQUAN) 25 MG capsule TAKE 1 CAPSULE BY MOUTHHONCE AT BEDTIME.   DULoxetine (CYMBALTA) 30 MG capsule Take 2 capsules by mouth daily.   EPINEPHrine 0.3 mg/0.3 mL IJ SOAJ injection Inject 0.3 mg into the muscle as needed for anaphylaxis.   ezetimibe (ZETIA) 10 MG tablet Take 10 mg by mouth every other day.   FIBER PO Take 1 capsule by mouth 2 (two) times daily.    hydrOXYzine (ATARAX) 25 MG tablet Take 25 mg by mouth as needed.   levothyroxine (SYNTHROID) 175 MCG tablet Take 1 tablet (175 mcg total) by mouth daily.   linaclotide (LINZESS) 290 MCG CAPS capsule TAKE 1 CAPSULE(290 MCG) BY MOUTH DAILY BEFORE BREAKFAST   liothyronine (CYTOMEL) 5 MCG tablet Take 5 mcg by mouth daily.   losartan (COZAAR) 50 MG tablet TAKE 1 TABLET BY MOUTH ONCE DAY.   meloxicam (MOBIC) 7.5 MG tablet Take 7.5 mg by mouth daily.   methocarbamol (ROBAXIN) 500 MG tablet Take 500 mg by mouth 2 (two) times daily.   nitroGLYCERIN (NITROSTAT) 0.4 MG SL tablet Place 1 tablet under the tongue as needed.   omeprazole (PRILOSEC) 20 MG capsule Take 20 mg by mouth daily.   pantoprazole (PROTONIX) 40 MG tablet TAKE 1 TABLET BY MOUTH ONCE DAILY.    pregabalin (LYRICA) 150 MG capsule Take 150 mg by mouth 2 (two) times daily.   propranolol (INDERAL) 20 MG tablet Take 20 mg by mouth 3 (three) times daily as needed for anxiety.   QUEtiapine (SEROQUEL) 25 MG tablet Take 25 mg by mouth 2 (two) times daily as needed.   traMADol (ULTRAM) 50 MG tablet Take 50 mg by mouth 3 (three) times daily as needed.   traZODone (DESYREL) 50 MG tablet Take 50-100 mg by mouth at bedtime as needed.   [DISCONTINUED] atorvastatin (LIPITOR) 80 MG tablet Take 1 tablet (80 mg total) by mouth at bedtime.   [DISCONTINUED] pregabalin (LYRICA) 100 MG capsule Take 100 mg by mouth 2 (two) times daily.   [DISCONTINUED] rosuvastatin (CRESTOR) 40 MG tablet Take 40 mg by mouth daily.     Allergies:    Codeine  Social History:   Social History   Tobacco Use   Smoking status: Every Day    Packs/day: 0.25    Years: 30.00    Total pack years: 7.50    Types: Cigarettes   Smokeless tobacco: Never  Vaping Use   Vaping Use: Never used  Substance Use Topics   Alcohol use: Yes    Comment: rare-maybe 2 glasses of wine/year   Drug use: Not Currently    Types: Marijuana    Comment: smoked marijuana starting at age 40 in [place]. Stopped use in 2020 after moving to [place]     Family Hx: Family History  Adopted: Yes  Problem Relation Age of Onset   Colon cancer Neg Hx      Review of Systems:   Please see the history of present illness.    All other systems reviewed and are negative.     EKGs/Labs/Other Test Reviewed:    EKG:  EKG performed April 2021 that I personally reviewed demonstrates sinus bradycardia; EKG performed today that I personally reviewed demonstrates sinus rhythm with incomplete right bundle branch block.  Prior CV studies:  ABI 2023: Normal  TTE 2023:  1. Left ventricular ejection fraction, by estimation, is 55 to 60%. The  left ventricle has normal function. The left ventricle has no regional  wall motion abnormalities. Left ventricular  diastolic parameters were  normal.   2. Right ventricular systolic function is normal. The right ventricular  size is normal. Tricuspid regurgitation signal is inadequate for assessing  PA pressure.   3. The mitral valve is normal in structure. Trivial mitral valve  regurgitation. No evidence of mitral stenosis.   4. The aortic valve is tricuspid. There is mild calcification of the  aortic valve. Aortic valve regurgitation is not visualized. No aortic  stenosis is present.   5. The inferior vena cava is normal in size with greater than 50%  respiratory variability, suggesting right atrial pressure of 3 mmHg.   Coronary CTA 2023: 1. Coronary calcium score of 11. This was 81st percentile for age and sex matched control. 2.  Normal coronary origin with right dominance. 3. Significant step artifacts due to respiratory motion affects interpretation, but no obstructive CAD seen 4. Nonobstructive CAD with calcified plaque in proximal LAD causing minimal (0-24%) stenosis    Other studies Reviewed: Review of the additional studies/records demonstrates:   CT abdomen pelvis 2022 demonstrates aortic atherosclerosis  Recent Labs: No results found for requested labs within last 365 days.   Recent Lipid Panel Outside hospital labs demonstrate cholesterol 172, triglycerides 95, HDL 65, and LDL of 90 April 2023  Risk Assessment/Calculations:           Physical Exam:    VS:  BP 120/80   Pulse (!) 59   Ht 5' 1"  (1.549 m)   Wt 165 lb 9.6 oz (75.1 kg)   SpO2 96%   BMI 31.29 kg/m    Wt Readings from Last 3 Encounters:  04/07/22 165 lb 9.6 oz (75.1 kg)  09/23/21 175 lb (79.4 kg)  08/17/21 176 lb 6.4 oz (80 kg)    GENERAL:  No apparent distress, AOx3 HEENT:  No carotid bruits, +2 carotid impulses, no scleral icterus CAR: RRR no murmurs, gallops, rubs, or thrills RES:  Clear to auscultation bilaterally ABD:  Soft, nontender, nondistended, positive bowel sounds x 4 VASC:  +2 radial  pulses, +2 carotid pulses, palpable pedal pulses NEURO:  CN 2-12 grossly intact; motor and sensory grossly intact  PSYCH:  No active depression or anxiety EXT:  No edema, ecchymosis, or cyanosis  Signed, Early Osmond, MD  04/07/2022 10:22 AM    [facility] [address], [place], [place]  [address] Phone: [phone]; Fax: [phone]   Note:  This document was prepared using Dragon voice recognition software and may include unintentional dictation errors.

## 2022-04-05 DIAGNOSIS — R5383 Other fatigue: Secondary | ICD-10-CM | POA: Diagnosis not present

## 2022-04-05 DIAGNOSIS — K5901 Slow transit constipation: Secondary | ICD-10-CM | POA: Diagnosis not present

## 2022-04-05 DIAGNOSIS — K219 Gastro-esophageal reflux disease without esophagitis: Secondary | ICD-10-CM | POA: Diagnosis not present

## 2022-04-07 ENCOUNTER — Ambulatory Visit: Payer: Medicare HMO | Attending: Internal Medicine | Admitting: Internal Medicine

## 2022-04-07 ENCOUNTER — Encounter: Payer: Self-pay | Admitting: Internal Medicine

## 2022-04-07 VITALS — BP 120/80 | HR 59 | Ht 61.0 in | Wt 165.6 lb

## 2022-04-07 DIAGNOSIS — Z720 Tobacco use: Secondary | ICD-10-CM | POA: Diagnosis not present

## 2022-04-07 DIAGNOSIS — I2510 Atherosclerotic heart disease of native coronary artery without angina pectoris: Secondary | ICD-10-CM

## 2022-04-07 DIAGNOSIS — I10 Essential (primary) hypertension: Secondary | ICD-10-CM | POA: Diagnosis not present

## 2022-04-07 DIAGNOSIS — I739 Peripheral vascular disease, unspecified: Secondary | ICD-10-CM

## 2022-04-07 DIAGNOSIS — I700 Atherosclerosis of aorta: Secondary | ICD-10-CM | POA: Diagnosis not present

## 2022-04-07 DIAGNOSIS — R072 Precordial pain: Secondary | ICD-10-CM

## 2022-04-07 DIAGNOSIS — E785 Hyperlipidemia, unspecified: Secondary | ICD-10-CM

## 2022-04-07 MED ORDER — ATORVASTATIN CALCIUM 80 MG PO TABS: 80.0000 mg | ORAL_TABLET | Freq: Every day | ORAL | 3 refills | Status: DC

## 2022-04-07 MED ORDER — ASPIRIN 81 MG PO TBEC: 81.0000 mg | DELAYED_RELEASE_TABLET | Freq: Every day | ORAL | Status: DC

## 2022-04-07 NOTE — Patient Instructions (Signed)
Medication Instructions:  Your physician has recommended you make the following change in your medication:   1) START aspirin 81 mg daily **Please be sure you are taking your ezetimibe (Zetia) every day.**  *If you need a refill on your cardiac medications before your next appointment, please call your pharmacy*  Lab Work: TODAY: Lipid panel, LFTs, LP(a) If you have labs (blood work) drawn today and your tests are completely normal, you will receive your results only by: [place] (if you have MyChart) OR A paper copy in the mail If you have any lab test that is abnormal or we need to change your treatment, we will call you to review the results.  Testing/Procedures: NONE  Follow-Up: At [facility], you and your health needs are our priority.  As part of our continuing mission to provide you with exceptional heart care, we have created designated Provider Care Teams.  These Care Teams include your primary Cardiologist (physician) and Advanced Practice Providers (APPs -  Physician Assistants and Nurse Practitioners) who all work together to provide you with the care you need, when you need it.  Your next appointment:   9 month(s)  The format for your next appointment:   In Person  Provider:   Nicholes Rough, PA-C, Melina Copa, PA-C, Ambrose Pancoast, NP, Christen Bame, NP, or Richardson Dopp, PA-C   Important Information About Sugar

## 2022-04-08 LAB — HEPATIC FUNCTION PANEL
ALT: 17 IU/L (ref 0–32)
AST: 18 IU/L (ref 0–40)
Albumin: 4.3 g/dL (ref 3.8–4.9)
Alkaline Phosphatase: 68 IU/L (ref 44–121)
Bilirubin Total: 0.5 mg/dL (ref 0.0–1.2)
Bilirubin, Direct: 0.15 mg/dL (ref 0.00–0.40)
Total Protein: 6 g/dL (ref 6.0–8.5)

## 2022-04-08 LAB — LIPID PANEL
Chol/HDL Ratio: 2.8 ratio (ref 0.0–4.4)
Cholesterol, Total: 152 mg/dL (ref 100–199)
HDL: 55 mg/dL (ref >39)
LDL Chol Calc (NIH): 76 mg/dL (ref 0–99)
Triglycerides: 118 mg/dL (ref 0–149)
VLDL Cholesterol Cal: 21 mg/dL (ref 5–40)

## 2022-04-08 LAB — LIPOPROTEIN A (LPA): Lipoprotein (a): 368.7 nmol/L — ABNORMAL HIGH (ref <75.0)

## 2022-04-13 ENCOUNTER — Telehealth: Payer: Self-pay | Admitting: Internal Medicine

## 2022-04-13 DIAGNOSIS — E785 Hyperlipidemia, unspecified: Secondary | ICD-10-CM

## 2022-04-13 DIAGNOSIS — E7841 Elevated Lipoprotein(a): Secondary | ICD-10-CM

## 2022-04-13 NOTE — Telephone Encounter (Signed)
Left detailed message of results (DPR) and to call back if any questions or if pt would like to discuss further.  Referral placed to Dr. Debara Pickett.

## 2022-04-13 NOTE — Telephone Encounter (Signed)
Patient returned RN's call regarding results. 

## 2022-04-26 ENCOUNTER — Encounter (HOSPITAL_COMMUNITY): Payer: Self-pay

## 2022-04-26 ENCOUNTER — Ambulatory Visit (INDEPENDENT_AMBULATORY_CARE_PROVIDER_SITE_OTHER): Payer: Medicare HMO | Admitting: Clinical

## 2022-04-26 DIAGNOSIS — F4310 Post-traumatic stress disorder, unspecified: Secondary | ICD-10-CM | POA: Diagnosis not present

## 2022-04-26 DIAGNOSIS — F17200 Nicotine dependence, unspecified, uncomplicated: Secondary | ICD-10-CM | POA: Diagnosis not present

## 2022-04-26 DIAGNOSIS — F331 Major depressive disorder, recurrent, moderate: Secondary | ICD-10-CM

## 2022-04-26 DIAGNOSIS — F419 Anxiety disorder, unspecified: Secondary | ICD-10-CM | POA: Diagnosis not present
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
## 2022-04-26 NOTE — Plan of Care (Signed)
Verbal Consent

## 2022-04-27 ENCOUNTER — Telehealth (HOSPITAL_COMMUNITY): Payer: Self-pay | Admitting: *Deleted

## 2022-04-27 NOTE — Telephone Encounter (Signed)
LMOM

## 2022-04-27 NOTE — Telephone Encounter (Signed)
Patient called stating she is needing refills for her Trazodone. Per pt she's out and can not go without it. Per pt her pharmacy is [facility]. Per patient she have not slept in 3 days and nothing is working. Per pt she sends her sleeping meds to local pharmacy and other meds goes to [facility].

## 2022-04-28 ENCOUNTER — Telehealth (HOSPITAL_COMMUNITY): Payer: Self-pay | Admitting: *Deleted

## 2022-04-28 DIAGNOSIS — F4310 Post-traumatic stress disorder, unspecified: Secondary | ICD-10-CM

## 2022-04-28 DIAGNOSIS — G4709 Other insomnia: Secondary | ICD-10-CM

## 2022-04-28 MED ORDER — DOXEPIN HCL 10 MG PO CAPS: 10.0000 mg | ORAL_CAPSULE | Freq: Every day | ORAL | 0 refills | Status: DC

## 2022-04-28 MED ORDER — TRAZODONE HCL 50 MG PO TABS: 50.0000 mg | ORAL_TABLET | Freq: Every evening | ORAL | 0 refills | Status: DC | PRN

## 2022-04-28 NOTE — Telephone Encounter (Signed)
Patient is ok with this

## 2022-04-28 NOTE — Telephone Encounter (Addendum)
Per previous message, patient would like the script to be sent to [facility]   54m dose of doxepin sent in, 1 week supply. 529mof trazodone refilled for 1 month supply.

## 2022-04-28 NOTE — Addendum Note (Signed)
Addended by: Debby Bud on: 04/28/2022 11:50 AM   Modules accepted: Orders

## 2022-05-03 ENCOUNTER — Other Ambulatory Visit: Payer: Self-pay | Admitting: Internal Medicine

## 2022-05-03 ENCOUNTER — Ambulatory Visit
Admission: RE | Admit: 2022-05-03 | Discharge: 2022-05-03 | Disposition: A | Discharge status: Home / Self Care | Payer: Medicare HMO | Source: Ambulatory Visit | Attending: Internal Medicine | Admitting: Internal Medicine

## 2022-05-03 DIAGNOSIS — M62838 Other muscle spasm: Secondary | ICD-10-CM | POA: Diagnosis not present

## 2022-05-03 DIAGNOSIS — M25519 Pain in unspecified shoulder: Secondary | ICD-10-CM | POA: Diagnosis not present

## 2022-05-03 DIAGNOSIS — M25512 Pain in left shoulder: Secondary | ICD-10-CM

## 2022-05-03 DIAGNOSIS — I10 Essential (primary) hypertension: Secondary | ICD-10-CM | POA: Diagnosis not present

## 2022-05-03 DIAGNOSIS — F332 Major depressive disorder, recurrent severe without psychotic features: Secondary | ICD-10-CM | POA: Diagnosis not present

## 2022-05-04 ENCOUNTER — Telehealth (INDEPENDENT_AMBULATORY_CARE_PROVIDER_SITE_OTHER): Payer: Medicare HMO | Admitting: Psychiatry

## 2022-05-04 ENCOUNTER — Encounter (HOSPITAL_COMMUNITY): Payer: Self-pay | Admitting: Psychiatry

## 2022-05-04 DIAGNOSIS — F331 Major depressive disorder, recurrent, moderate: Secondary | ICD-10-CM

## 2022-05-04 DIAGNOSIS — M545 Low back pain, unspecified: Secondary | ICD-10-CM

## 2022-05-04 DIAGNOSIS — E038 Other specified hypothyroidism: Secondary | ICD-10-CM | POA: Diagnosis not present

## 2022-05-04 DIAGNOSIS — K5000 Crohn's disease of small intestine without complications: Secondary | ICD-10-CM | POA: Diagnosis not present

## 2022-05-04 DIAGNOSIS — F4310 Post-traumatic stress disorder, unspecified: Secondary | ICD-10-CM | POA: Diagnosis not present

## 2022-05-04 DIAGNOSIS — G4709 Other insomnia: Secondary | ICD-10-CM

## 2022-05-04 DIAGNOSIS — E063 Autoimmune thyroiditis: Secondary | ICD-10-CM

## 2022-05-04 DIAGNOSIS — F4000 Agoraphobia, unspecified: Secondary | ICD-10-CM | POA: Diagnosis not present

## 2022-05-04 DIAGNOSIS — F17200 Nicotine dependence, unspecified, uncomplicated: Secondary | ICD-10-CM | POA: Diagnosis not present

## 2022-05-04 DIAGNOSIS — Z9151 Personal history of suicidal behavior: Secondary | ICD-10-CM

## 2022-05-04 DIAGNOSIS — Z79899 Other long term (current) drug therapy: Secondary | ICD-10-CM | POA: Diagnosis not present

## 2022-05-04 DIAGNOSIS — F419 Anxiety disorder, unspecified: Secondary | ICD-10-CM

## 2022-05-04 MED ORDER — DULOXETINE HCL 30 MG PO CPEP: 90.0000 mg | ORAL_CAPSULE | Freq: Every day | ORAL | 0 refills | Status: DC

## 2022-05-04 NOTE — Patient Instructions (Signed)
We discontinued your doxepin today as well as your hydroxyzine. You may take seroquel as needed in place of both. We increased your cymbalta to 50m daily. Your PCP will place a referral for nutrition. Please come by the clinic to get set up on your mychart.
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
## 2022-05-07 DIAGNOSIS — M47816 Spondylosis without myelopathy or radiculopathy, lumbar region: Secondary | ICD-10-CM | POA: Diagnosis not present

## 2022-05-07 DIAGNOSIS — M62830 Muscle spasm of back: Secondary | ICD-10-CM | POA: Diagnosis not present

## 2022-05-07 DIAGNOSIS — M7911 Myalgia of mastication muscle: Secondary | ICD-10-CM | POA: Diagnosis not present

## 2022-05-20 ENCOUNTER — Other Ambulatory Visit (HOSPITAL_COMMUNITY): Payer: Self-pay | Admitting: Psychiatry

## 2022-05-20 DIAGNOSIS — F4310 Post-traumatic stress disorder, unspecified: Secondary | ICD-10-CM

## 2022-05-20 DIAGNOSIS — G4709 Other insomnia: Secondary | ICD-10-CM

## 2022-06-01 ENCOUNTER — Ambulatory Visit (INDEPENDENT_AMBULATORY_CARE_PROVIDER_SITE_OTHER): Payer: Medicare HMO | Admitting: Clinical

## 2022-06-01 DIAGNOSIS — F331 Major depressive disorder, recurrent, moderate: Secondary | ICD-10-CM | POA: Diagnosis not present

## 2022-06-01 DIAGNOSIS — F419 Anxiety disorder, unspecified: Secondary | ICD-10-CM

## 2022-06-01 DIAGNOSIS — F4310 Post-traumatic stress disorder, unspecified: Secondary | ICD-10-CM

## 2022-06-01 NOTE — Progress Notes (Signed)
Virtual Visit via Video Note  I connected with Victoria Deleon on 06/01/22 at  9:00 AM EST by a video enabled telemedicine application and verified that I am speaking with the correct person using two identifiers.  Location: Patient: Home Provider: Office   I discussed the limitations of evaluation and management by telemedicine and the availability of in person appointments. The patient expressed understanding and agreed to proceed.  THERAPIST PROGRESS NOTE   Session Time: 9:00 AM-9:30 AM   Participation Level: Active   Behavioral Response: CasualAlertDepressed   Type of Therapy: Individual Therapy   Treatment Goals addressed: Coping for Anxiety and mood management   Interventions: CBT, Motivational Interviewing, Solution Focused and Strength-based   Summary: Victoria Deleon. Victoria Deleon is a 57 y.o. female who presents with PTSD and MDD with Anxiety. The OPT therapist worked with the patient for her ongoing OPT treatment. The OPT therapist utilized Motivational Interviewing to assist in creating therapeutic repore. The patient in the session was engaged and work in collaboration giving feedback about her triggers and symptoms over the past few weeks. The patient spoke about have less stressors through the end of the 2023 year as she was just at home with her partner, however, noted that things will be more active as she will be involved with all of her grandchildren after school starts back this week.The OPT therapist utilized Cognitive Behavioral Therapy through cognitive restructuring as well as worked with the patient on coping strategies to assist in management of mood and being active. The OPT therapist promoted the patients work to challenge automatic negative thinking and promoted positive mindset.The OPT therapist overviewed coping strategies and the patient spoke about her involvement in doing art work at home as a leisure and management for her anxiety. The OPT therapist overviewed changes  and adjustments post the patients most recent med management appointment with Dr. Nehemiah Settle.The patient overviewed her upcoming health appointments as listed in patients MyChart. The patient has a health care appointment with her thyroid specialist tomorrow in [place], [place].   Suicidal/Homicidal: Nowithout intent/plan   Therapist Response: The OPT therapist worked with the patient for the patients scheduled session. The patient was engaged in her session and gave feedback in relation to triggers, symptoms, and behavior responses over the past few weeks. The patient shared progression in management of physical and mental health working to be more active. The OPT therapist worked with the patient utilizing an in session Cognitive Behavioral Therapy exercise. The patient was responsive in the session and verbalized, "I have been doing my art work more and it has been less stressful because there havent been as many people around, but school starts back this week and I will be busy and involved with all my grandchildren". The patient spoke about her plans to take the upcoming change day by day until she settles into the routine. The OPT gave support and encouragement with the patient who continues to work on using coping strategies with consistency and being aware of her baseline. The OPT therapist worked with the patient reviewing coping strategies and promoting on going self check-ins. The OPT therapist will continue treatment work with the patient in her next scheduled session.   Plan: Return again in 2/3 weeks.   Diagnosis:      Axis I:  Recurrent Moderate MDD with Anxiety /PTSD                           Axis II:  No diagnosis   Collaboration of Care: collaboration of care for this session review of med therapy with Dr. Nehemiah Settle.   Patient/Guardian was advised Release of Information must be obtained prior to any record release in order to collaborate their care with an outside provider. Patient/Guardian  was advised if they have not already done so to contact the registration department to sign all necessary forms in order for [place] to release information regarding their care.    Consent: Patient/Guardian gives verbal consent for treatment and assignment of benefits for services provided during this visit. Patient/Guardian expressed understanding and agreed to proceed.        I discussed the assessment and treatment plan with the patient. The patient was provided an opportunity to ask questions and all were answered. The patient agreed with the plan and demonstrated an understanding of the instructions.   The patient was advised to call back or seek an in-person evaluation if the symptoms worsen or if the condition fails to improve as anticipated.   I provided 30 minutes of non-face-to-face time during this encounter.   Maye Hides, LCSW   06/01/2022

## 2022-06-02 ENCOUNTER — Other Ambulatory Visit (HOSPITAL_COMMUNITY): Payer: Self-pay | Admitting: Internal Medicine

## 2022-06-02 DIAGNOSIS — K5090 Crohn's disease, unspecified, without complications: Secondary | ICD-10-CM | POA: Diagnosis not present

## 2022-06-02 DIAGNOSIS — R946 Abnormal results of thyroid function studies: Secondary | ICD-10-CM | POA: Diagnosis not present

## 2022-06-02 DIAGNOSIS — E039 Hypothyroidism, unspecified: Secondary | ICD-10-CM | POA: Diagnosis not present

## 2022-06-07 ENCOUNTER — Other Ambulatory Visit (HOSPITAL_COMMUNITY): Payer: Self-pay | Admitting: Psychiatry

## 2022-06-07 ENCOUNTER — Telehealth (HOSPITAL_COMMUNITY): Payer: Medicare HMO | Admitting: Psychiatry

## 2022-06-07 DIAGNOSIS — G4709 Other insomnia: Secondary | ICD-10-CM

## 2022-06-07 DIAGNOSIS — F4310 Post-traumatic stress disorder, unspecified: Secondary | ICD-10-CM

## 2022-06-07 DIAGNOSIS — H52223 Regular astigmatism, bilateral: Secondary | ICD-10-CM | POA: Diagnosis not present

## 2022-06-08 ENCOUNTER — Encounter: Payer: Self-pay | Admitting: Internal Medicine

## 2022-06-09 ENCOUNTER — Telehealth (INDEPENDENT_AMBULATORY_CARE_PROVIDER_SITE_OTHER): Payer: Medicare HMO | Admitting: Psychiatry

## 2022-06-09 ENCOUNTER — Encounter (HOSPITAL_COMMUNITY): Payer: Self-pay | Admitting: Psychiatry

## 2022-06-09 DIAGNOSIS — F4310 Post-traumatic stress disorder, unspecified: Secondary | ICD-10-CM

## 2022-06-09 DIAGNOSIS — F17200 Nicotine dependence, unspecified, uncomplicated: Secondary | ICD-10-CM | POA: Diagnosis not present

## 2022-06-09 DIAGNOSIS — F331 Major depressive disorder, recurrent, moderate: Secondary | ICD-10-CM

## 2022-06-09 DIAGNOSIS — M545 Low back pain, unspecified: Secondary | ICD-10-CM | POA: Diagnosis not present

## 2022-06-09 DIAGNOSIS — G4709 Other insomnia: Secondary | ICD-10-CM

## 2022-06-09 DIAGNOSIS — F4000 Agoraphobia, unspecified: Secondary | ICD-10-CM | POA: Diagnosis not present

## 2022-06-09 DIAGNOSIS — Z79899 Other long term (current) drug therapy: Secondary | ICD-10-CM

## 2022-06-09 MED ORDER — MIRTAZAPINE 15 MG PO TABS: 15.0000 mg | ORAL_TABLET | Freq: Every day | ORAL | 2 refills | Status: DC

## 2022-06-09 NOTE — Patient Instructions (Signed)
We discontinued your trazodone today in favor of starting Remeron 15 mg nightly.  This should help with both sleep and nausea and if we titrate the dose in time will likely provide further benefit for your depression and anxiety.  With decreasing use of tramadol and methocarbamol it should be safer to use Remeron and Cymbalta together.  But if you notice episodes of sweating, shaking, flushing this could be signs of something called serotonin syndrome and would recommend you stop Cymbalta and Remeron if you noticed this and please call our office.  There is a low chance that this happens and I am not expecting it to happen this is just for year awareness.
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
## 2022-06-10 ENCOUNTER — Ambulatory Visit (HOSPITAL_COMMUNITY)
Admission: RE | Admit: 2022-06-10 | Discharge: 2022-06-10 | Disposition: A | Discharge status: Home / Self Care | Payer: Medicare HMO | Source: Ambulatory Visit | Attending: Internal Medicine | Admitting: Internal Medicine

## 2022-06-10 DIAGNOSIS — E039 Hypothyroidism, unspecified: Secondary | ICD-10-CM | POA: Diagnosis not present

## 2022-06-10 DIAGNOSIS — R946 Abnormal results of thyroid function studies: Secondary | ICD-10-CM | POA: Insufficient documentation

## 2022-06-21 ENCOUNTER — Encounter (HOSPITAL_COMMUNITY): Payer: Self-pay

## 2022-06-23 ENCOUNTER — Other Ambulatory Visit (HOSPITAL_COMMUNITY): Payer: Self-pay | Admitting: Psychiatry

## 2022-06-23 DIAGNOSIS — G4709 Other insomnia: Secondary | ICD-10-CM

## 2022-06-23 MED ORDER — TRAZODONE HCL 50 MG PO TABS: 50.0000 mg | ORAL_TABLET | Freq: Every day | ORAL | 1 refills | Status: DC

## 2022-06-23 NOTE — Progress Notes (Signed)
Patient called in, trial of remeron ultimately ineffective for insomnia. Will resume trazodone per her request. Will continue to closely monitor for serotonin syndrome given cymbalta and tramadol use.

## 2022-06-23 NOTE — Telephone Encounter (Signed)
Pt called in stating that she hasn't heard anything back yet and is wanting to make sure provider got message. Please advise

## 2022-06-28 ENCOUNTER — Ambulatory Visit (HOSPITAL_COMMUNITY): Payer: Medicare HMO | Admitting: Clinical

## 2022-07-12 ENCOUNTER — Telehealth (INDEPENDENT_AMBULATORY_CARE_PROVIDER_SITE_OTHER): Payer: Medicare HMO | Admitting: Psychiatry

## 2022-07-12 ENCOUNTER — Encounter (HOSPITAL_COMMUNITY): Payer: Self-pay | Admitting: Psychiatry

## 2022-07-12 DIAGNOSIS — F4310 Post-traumatic stress disorder, unspecified: Secondary | ICD-10-CM

## 2022-07-12 DIAGNOSIS — E559 Vitamin D deficiency, unspecified: Secondary | ICD-10-CM | POA: Diagnosis not present

## 2022-07-12 DIAGNOSIS — F411 Generalized anxiety disorder: Secondary | ICD-10-CM | POA: Diagnosis not present

## 2022-07-12 DIAGNOSIS — F17200 Nicotine dependence, unspecified, uncomplicated: Secondary | ICD-10-CM | POA: Diagnosis not present

## 2022-07-12 DIAGNOSIS — F4000 Agoraphobia, unspecified: Secondary | ICD-10-CM | POA: Diagnosis not present

## 2022-07-12 DIAGNOSIS — F331 Major depressive disorder, recurrent, moderate: Secondary | ICD-10-CM | POA: Diagnosis not present

## 2022-07-12 DIAGNOSIS — Z79899 Other long term (current) drug therapy: Secondary | ICD-10-CM

## 2022-07-12 DIAGNOSIS — F410 Panic disorder [episodic paroxysmal anxiety] without agoraphobia: Secondary | ICD-10-CM | POA: Diagnosis not present

## 2022-07-12 DIAGNOSIS — G4709 Other insomnia: Secondary | ICD-10-CM

## 2022-07-12 DIAGNOSIS — Z9151 Personal history of suicidal behavior: Secondary | ICD-10-CM

## 2022-07-12 MED ORDER — DULOXETINE HCL 60 MG PO CPEP: 120.0000 mg | ORAL_CAPSULE | Freq: Every day | ORAL | 3 refills | Status: DC

## 2022-07-12 MED ORDER — TRAZODONE HCL 100 MG PO TABS: 100.0000 mg | ORAL_TABLET | Freq: Every day | ORAL | 3 refills | Status: DC

## 2022-07-12 NOTE — Patient Instructions (Signed)
We increased the Cymbalta to 120 mg once daily today.  This should help in quieting your mind when you go to sleep at night as well as maintaining mood stability with less severe bouts of depression and help with chronic pain.  We also increased the trazodone to 100 mg once nightly and that should help with sleep further.  Please call the nutritionist back to get set up with them.
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
## 2022-07-15 DIAGNOSIS — E039 Hypothyroidism, unspecified: Secondary | ICD-10-CM | POA: Diagnosis not present

## 2022-07-29 ENCOUNTER — Encounter: Payer: Self-pay | Admitting: Radiology

## 2022-08-04 ENCOUNTER — Other Ambulatory Visit (HOSPITAL_COMMUNITY): Payer: Self-pay | Admitting: Psychiatry

## 2022-08-04 DIAGNOSIS — F4310 Post-traumatic stress disorder, unspecified: Secondary | ICD-10-CM

## 2022-08-04 DIAGNOSIS — G4709 Other insomnia: Secondary | ICD-10-CM

## 2022-08-08 IMAGING — US US EXTREM LOW VENOUS*L*
1 series · 13 of 24 positions shown · non-contrast
Comparison: None Available.

CLINICAL DATA: 55-year-old female with left calf pain.

EXAM:
LEFT LOWER EXTREMITY VENOUS DOPPLER ULTRASOUND
TECHNIQUE: Gray-scale sonography with graded compression, as well as color
Doppler and duplex ultrasound were performed to evaluate the left
lower extremity deep venous systems from the level of the common
femoral vein and including the common femoral, femoral, profunda
femoral, popliteal and calf veins including the posterior tibial,
peroneal and gastrocnemius veins when visible. Spectral Doppler was
utilized to evaluate flow at rest and with distal augmentation
maneuvers in the common femoral, femoral and popliteal veins. The
contralateral common femoral vein was also evaluated for comparison.

[Series 1: us extrem low venous*left* · 0.06mm/px · 39 acquisitions, 13 frames shown]
[im 1/39]
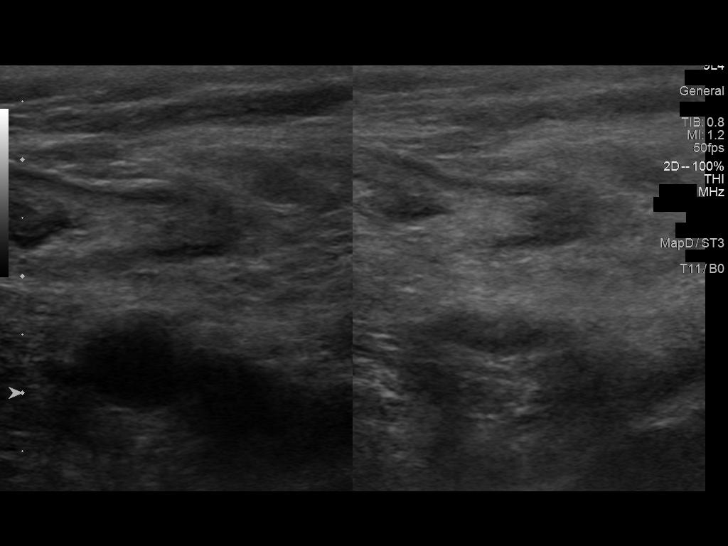
[im 4/39]
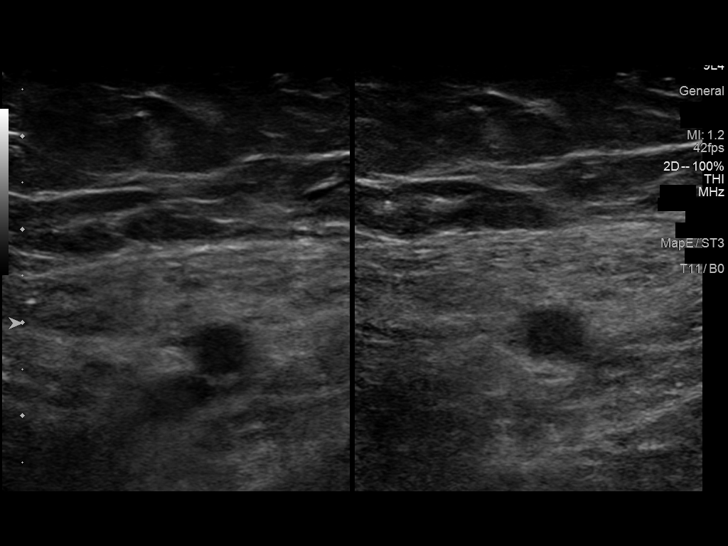
[im 7/39]
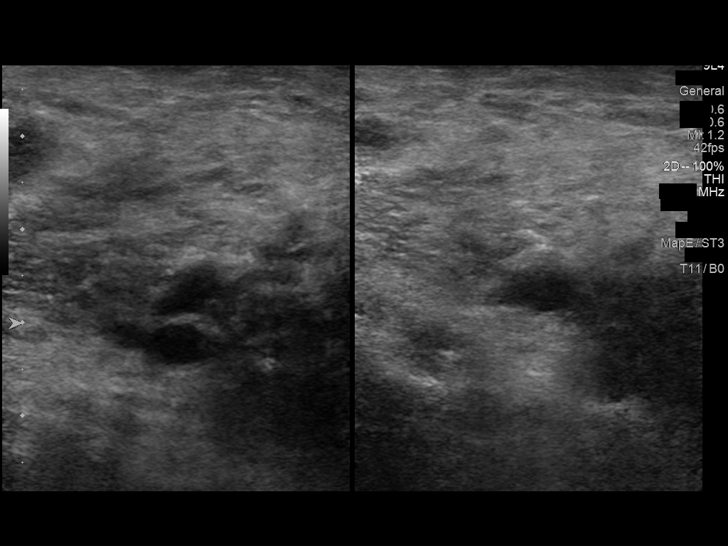
[im 10/39]
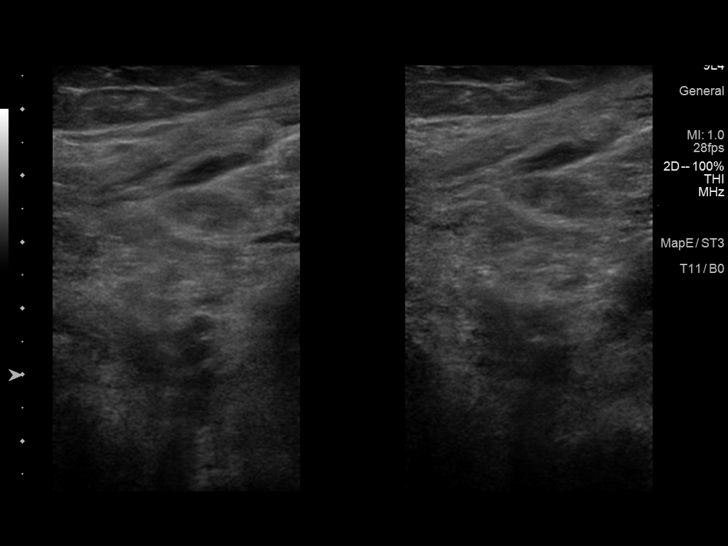
[im 14/39]
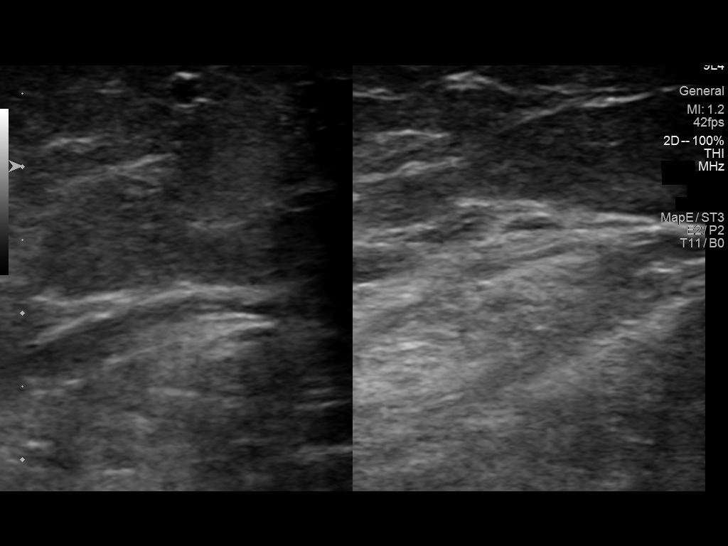
[im 17/39]
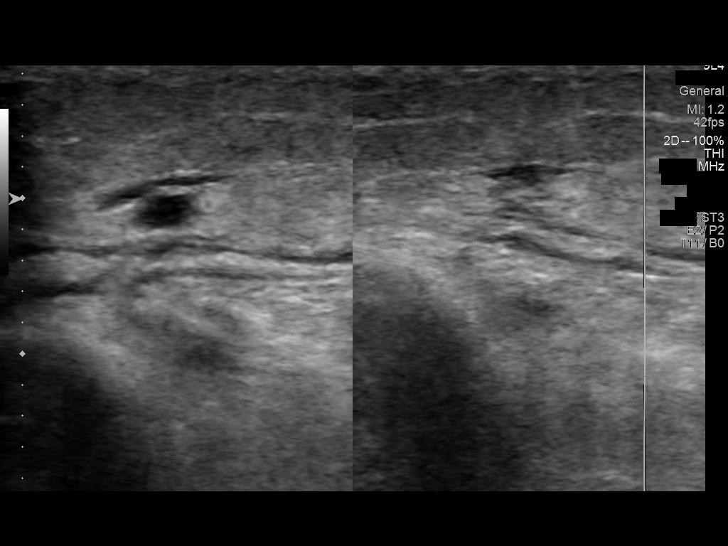
[im 22/39]
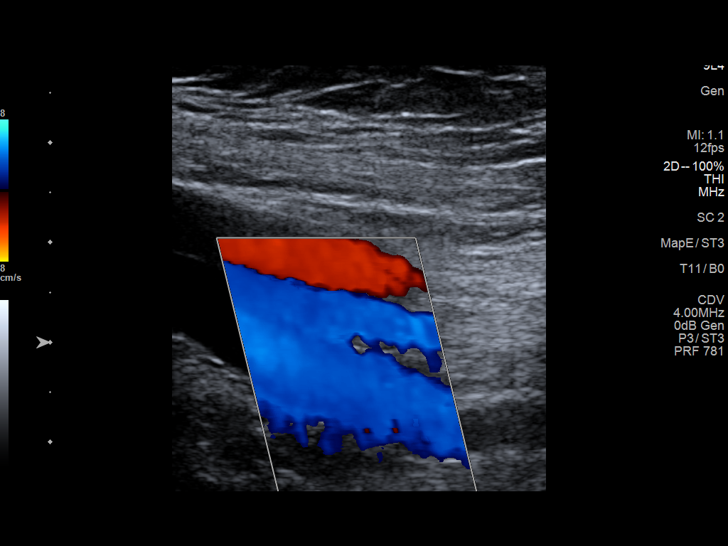
[im 24/39]
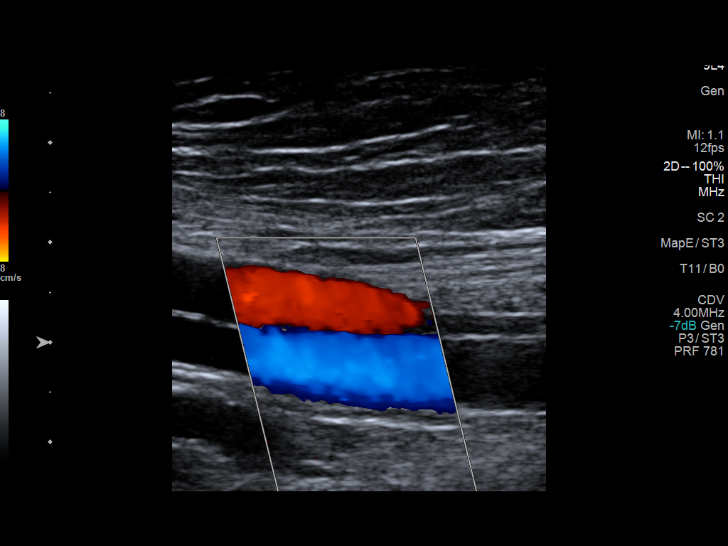
[im 27/39]
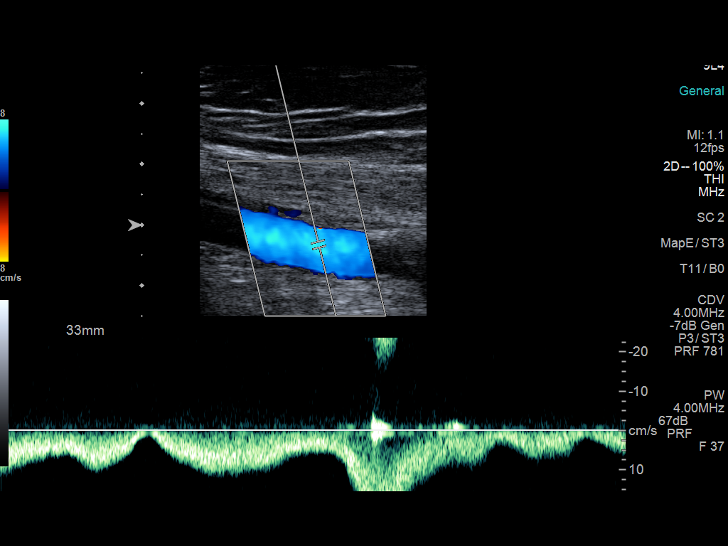
[im 30/39]
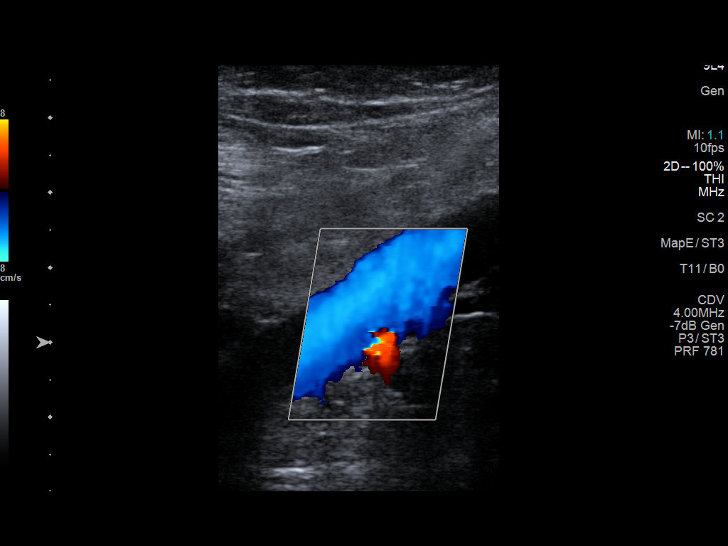
[im 34/39]
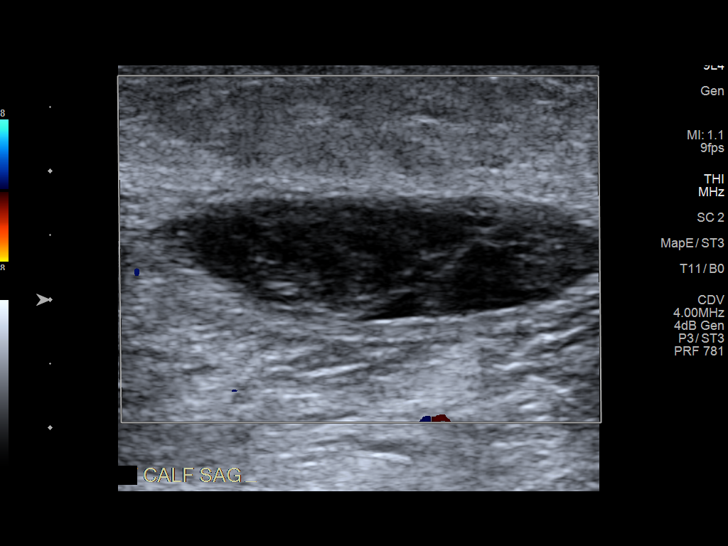
[im 35/39]
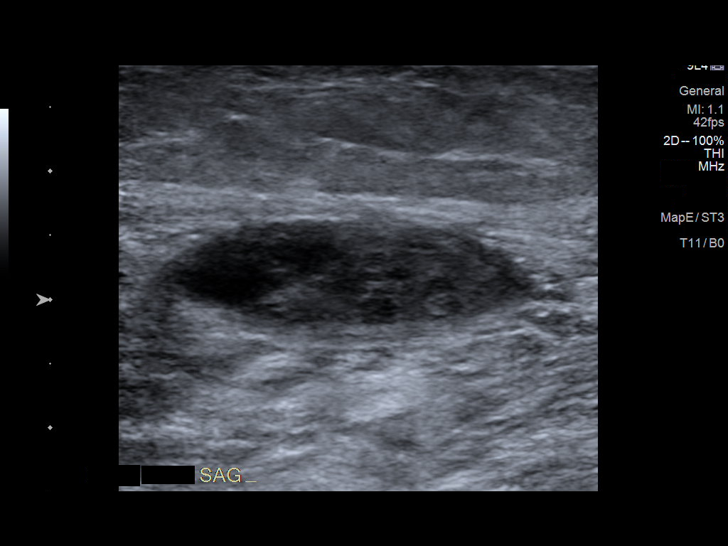
[im 39/39]
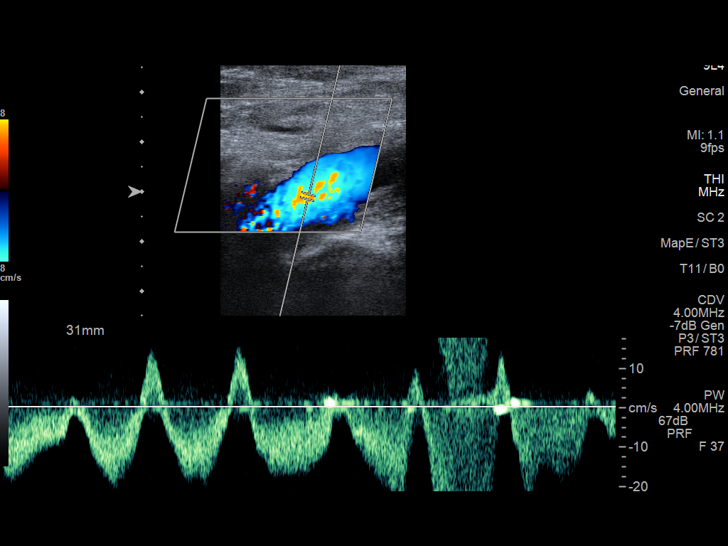

[13 of 24 positions shown; findings below may reference images not displayed]

FINDINGS: LEFT LOWER EXTREMITY

Common Femoral Vein: No evidence of thrombus. Normal
compressibility, respiratory phasicity and response to augmentation.

Central Greater Saphenous Vein: No evidence of thrombus. Normal
compressibility and flow on color Doppler imaging.

Central Profunda Femoral Vein: No evidence of thrombus. Normal
compressibility and flow on color Doppler imaging.

Femoral Vein: No evidence of thrombus. Normal compressibility,
respiratory phasicity and response to augmentation.

Popliteal Vein: No evidence of thrombus. Normal compressibility,
respiratory phasicity and response to augmentation.

Calf Veins: No evidence of thrombus. Normal compressibility and flow
on color Doppler imaging.

Other Findings:  None.

RIGHT LOWER EXTREMITY

Common Femoral Vein: No evidence of thrombus. Normal
compressibility, respiratory phasicity and response to augmentation.
IMPRESSION: No evidence of left lower extremity deep venous thrombosis.

## 2022-08-11 DIAGNOSIS — R5383 Other fatigue: Secondary | ICD-10-CM | POA: Diagnosis not present

## 2022-08-11 DIAGNOSIS — R059 Cough, unspecified: Secondary | ICD-10-CM | POA: Diagnosis not present

## 2022-08-11 DIAGNOSIS — Z03818 Encounter for observation for suspected exposure to other biological agents ruled out: Secondary | ICD-10-CM | POA: Diagnosis not present

## 2022-08-11 DIAGNOSIS — R6889 Other general symptoms and signs: Secondary | ICD-10-CM | POA: Diagnosis not present

## 2022-08-11 DIAGNOSIS — M129 Arthropathy, unspecified: Secondary | ICD-10-CM | POA: Diagnosis not present

## 2022-08-11 DIAGNOSIS — F411 Generalized anxiety disorder: Secondary | ICD-10-CM | POA: Diagnosis not present

## 2022-08-25 DIAGNOSIS — H1033 Unspecified acute conjunctivitis, bilateral: Secondary | ICD-10-CM | POA: Diagnosis not present

## 2022-08-25 DIAGNOSIS — J019 Acute sinusitis, unspecified: Secondary | ICD-10-CM | POA: Diagnosis not present

## 2022-08-25 DIAGNOSIS — R509 Fever, unspecified: Secondary | ICD-10-CM | POA: Diagnosis not present

## 2022-08-25 DIAGNOSIS — Z03818 Encounter for observation for suspected exposure to other biological agents ruled out: Secondary | ICD-10-CM | POA: Diagnosis not present

## 2022-08-25 DIAGNOSIS — E063 Autoimmune thyroiditis: Secondary | ICD-10-CM | POA: Diagnosis not present

## 2022-08-25 DIAGNOSIS — R059 Cough, unspecified: Secondary | ICD-10-CM | POA: Diagnosis not present

## 2022-08-25 DIAGNOSIS — J069 Acute upper respiratory infection, unspecified: Secondary | ICD-10-CM | POA: Diagnosis not present

## 2022-09-01 ENCOUNTER — Encounter (HOSPITAL_COMMUNITY): Payer: Self-pay | Admitting: Psychiatry

## 2022-09-01 ENCOUNTER — Telehealth (INDEPENDENT_AMBULATORY_CARE_PROVIDER_SITE_OTHER): Payer: Medicare HMO | Admitting: Psychiatry

## 2022-09-01 DIAGNOSIS — E559 Vitamin D deficiency, unspecified: Secondary | ICD-10-CM

## 2022-09-01 DIAGNOSIS — G4709 Other insomnia: Secondary | ICD-10-CM

## 2022-09-01 DIAGNOSIS — F17200 Nicotine dependence, unspecified, uncomplicated: Secondary | ICD-10-CM | POA: Diagnosis not present

## 2022-09-01 DIAGNOSIS — F331 Major depressive disorder, recurrent, moderate: Secondary | ICD-10-CM

## 2022-09-01 DIAGNOSIS — F411 Generalized anxiety disorder: Secondary | ICD-10-CM | POA: Diagnosis not present

## 2022-09-01 DIAGNOSIS — F4310 Post-traumatic stress disorder, unspecified: Secondary | ICD-10-CM | POA: Diagnosis not present

## 2022-09-01 DIAGNOSIS — F4000 Agoraphobia, unspecified: Secondary | ICD-10-CM | POA: Diagnosis not present

## 2022-09-01 DIAGNOSIS — Z79899 Other long term (current) drug therapy: Secondary | ICD-10-CM

## 2022-09-01 DIAGNOSIS — F410 Panic disorder [episodic paroxysmal anxiety] without agoraphobia: Secondary | ICD-10-CM | POA: Diagnosis not present

## 2022-09-01 MED ORDER — TRAZODONE HCL 50 MG PO TABS: ORAL_TABLET | ORAL | 2 refills | Status: DC

## 2022-09-01 NOTE — Patient Instructions (Signed)
We changed the trazodone to be 50 mg nightly and you can take half a tablet (25 mg) if you wake up during the night and cannot fall back asleep.  We will give a little more time on the Cymbalta to see how it does once you are not as physically ill.
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
## 2022-09-03 ENCOUNTER — Ambulatory Visit (INDEPENDENT_AMBULATORY_CARE_PROVIDER_SITE_OTHER): Payer: Medicare HMO | Admitting: Clinical

## 2022-09-03 DIAGNOSIS — F419 Anxiety disorder, unspecified: Secondary | ICD-10-CM

## 2022-09-03 DIAGNOSIS — F4310 Post-traumatic stress disorder, unspecified: Secondary | ICD-10-CM | POA: Diagnosis not present

## 2022-09-03 DIAGNOSIS — F331 Major depressive disorder, recurrent, moderate: Secondary | ICD-10-CM | POA: Diagnosis not present

## 2022-09-03 NOTE — Progress Notes (Signed)
Virtual Visit via Video Note   I connected with Victoria Deleon on 09/03/22 at  10:00 AM EST by a video enabled telemedicine application and verified that I am speaking with the correct person using two identifiers.   Location: Patient: Home Provider: Office   I discussed the limitations of evaluation and management by telemedicine and the availability of in person appointments. The patient expressed understanding and agreed to proceed.   THERAPIST PROGRESS NOTE   Session Time: 10:00 AM-10:30 AM   Participation Level: Active   Behavioral Response: CasualAlertDepressed   Type of Therapy: Individual Therapy   Treatment Goals addressed: Coping for Anxiety and mood management   Interventions: CBT, Motivational Interviewing, Solution Focused and Strength-based   Summary: Victoria Deleon is a 57 y.o. female who presents with PTSD and MDD with Anxiety. The OPT therapist worked with the patient for her ongoing OPT treatment. The OPT therapist utilized Motivational Interviewing to assist in creating therapeutic repore. The patient in the session was engaged and work in collaboration giving feedback about her triggers and symptoms over the past few weeks. The patient has been under the weather and on a course of antibiotics for Bronchitis and Pink Eye. The patient spoke about ongoing difficulty with stress and sleep and continues to work with Dr. Adrian BlackwaterStinson in the Med Therapy program.The OPT therapist utilized Cognitive Behavioral Therapy through cognitive restructuring as well as worked with the patient on coping strategies to assist in management of mood and being active. The OPT therapist promoted the patients work to challenge automatic negative thinking and promoted positive mindset.The OPT therapist overviewed coping strategies and the patient spoke about her involvement in doing art work at home  and planting in the garden for the Spring season as a leisure and management for her anxiety. The OPT  therapist overviewed changes and adjustments post the patients most recent med management appointment with Dr. Adrian BlackwaterStinson.The patient overviewed her upcoming health appointments as listed in patients MyChart.    Suicidal/Homicidal: Nowithout intent/plan   Therapist Response: The OPT therapist worked with the patient for the patients scheduled session. The patient was engaged in her session and gave feedback in relation to triggers, symptoms, and behavior responses over the past few weeks. The patient shared progression in management of physical and mental health working to be more active. The OPT therapist worked with the patient utilizing an in session Cognitive Behavioral Therapy exercise. The patient was responsive in the session and verbalized, "I have been doing my art work , spending time with my grandchildren, and working on our [place] acres of land". The patient spoke about her plans to take the upcoming change day by day.  The patient noted she is feeling better since taking the antibiotics.The OPT gave support and encouragement with the patient who continues to work on using coping strategies with consistency and being aware of her baseline. The OPT therapist worked with the patient reviewing coping strategies and promoting on going self check-ins. The OPT therapist will continue treatment work with the patient in her next scheduled session.   Plan: Return again in 2/3 weeks.   Diagnosis:      Axis I:  Recurrent Moderate MDD with Anxiety /PTSD                           Axis II: No diagnosis   Collaboration of Care: collaboration of care for this session review of med therapy with Dr. Adrian BlackwaterStinson.  Patient/Guardian was advised Release of Information must be obtained prior to any record release in order to collaborate their care with an outside provider. Patient/Guardian was advised if they have not already done so to contact the registration department to sign all necessary forms in order for [place] to  release information regarding their care.    Consent: Patient/Guardian gives verbal consent for treatment and assignment of benefits for services provided during this visit. Patient/Guardian expressed understanding and agreed to proceed.        I discussed the assessment and treatment plan with the patient. The patient was provided an opportunity to ask questions and all were answered. The patient agreed with the plan and demonstrated an understanding of the instructions.   The patient was advised to call back or seek an in-person evaluation if the symptoms worsen or if the condition fails to improve as anticipated.   I provided 30 minutes of non-face-to-face time during this encounter.   Suzan Garibaldi, LCSW   09/03/2022

## 2022-09-14 DIAGNOSIS — E039 Hypothyroidism, unspecified: Secondary | ICD-10-CM | POA: Diagnosis not present

## 2022-09-15 ENCOUNTER — Telehealth (HOSPITAL_COMMUNITY): Payer: Self-pay | Admitting: *Deleted

## 2022-09-15 DIAGNOSIS — F4000 Agoraphobia, unspecified: Secondary | ICD-10-CM

## 2022-09-15 DIAGNOSIS — F4310 Post-traumatic stress disorder, unspecified: Secondary | ICD-10-CM

## 2022-09-15 DIAGNOSIS — Z9151 Personal history of suicidal behavior: Secondary | ICD-10-CM

## 2022-09-15 MED ORDER — DULOXETINE HCL 60 MG PO CPEP
120.0000 mg | ORAL_CAPSULE | Freq: Every day | ORAL | 3 refills | Status: DC
Start: 2022-09-15 — End: 2022-10-28

## 2022-09-15 NOTE — Addendum Note (Signed)
Addended by: Tia Masker on: 09/15/2022 02:21 PM   Modules accepted: Orders

## 2022-09-15 NOTE — Telephone Encounter (Addendum)
Patient pharmacy [facility] sent paper to office stating they are needing refills for patient Duloxetine 60 mg delayed release and Mirtazapine 15 mg. Staff called patient to verify if she is still taking Mirtazapine 15 mg due to it not being on patient current medication list. Per pt she is only taking the Cymbalta. Patient then changed her pharmacy to mail order pharmacy which is the [facility].   Refill called in to [facility] for cymbalta. Confirmed patient no longer on mirtazapine, is on trazodone now.

## 2022-09-16 DIAGNOSIS — M25561 Pain in right knee: Secondary | ICD-10-CM | POA: Diagnosis not present

## 2022-09-16 DIAGNOSIS — S83241A Other tear of medial meniscus, current injury, right knee, initial encounter: Secondary | ICD-10-CM | POA: Diagnosis not present

## 2022-09-22 ENCOUNTER — Institutional Professional Consult (permissible substitution) (HOSPITAL_BASED_OUTPATIENT_CLINIC_OR_DEPARTMENT_OTHER): Payer: Medicare HMO | Admitting: Internal Medicine

## 2022-09-28 ENCOUNTER — Other Ambulatory Visit (HOSPITAL_COMMUNITY): Payer: Self-pay | Admitting: Psychiatry

## 2022-09-28 DIAGNOSIS — F4310 Post-traumatic stress disorder, unspecified: Secondary | ICD-10-CM

## 2022-09-28 DIAGNOSIS — Z9151 Personal history of suicidal behavior: Secondary | ICD-10-CM

## 2022-09-28 DIAGNOSIS — F4000 Agoraphobia, unspecified: Secondary | ICD-10-CM

## 2022-10-01 ENCOUNTER — Ambulatory Visit (HOSPITAL_COMMUNITY): Payer: Medicare HMO | Admitting: Clinical

## 2022-10-01 DIAGNOSIS — M23201 Derangement of unspecified lateral meniscus due to old tear or injury, left knee: Secondary | ICD-10-CM | POA: Diagnosis not present

## 2022-10-01 DIAGNOSIS — G8918 Other acute postprocedural pain: Secondary | ICD-10-CM | POA: Diagnosis not present

## 2022-10-01 DIAGNOSIS — S83241A Other tear of medial meniscus, current injury, right knee, initial encounter: Secondary | ICD-10-CM | POA: Diagnosis not present

## 2022-10-01 DIAGNOSIS — M94261 Chondromalacia, right knee: Secondary | ICD-10-CM | POA: Diagnosis not present

## 2022-10-11 ENCOUNTER — Telehealth (HOSPITAL_COMMUNITY): Payer: Self-pay | Admitting: Psychiatry

## 2022-10-11 DIAGNOSIS — G4709 Other insomnia: Secondary | ICD-10-CM

## 2022-10-11 MED ORDER — TRAZODONE HCL 50 MG PO TABS: ORAL_TABLET | ORAL | 0 refills | Status: DC

## 2022-10-11 NOTE — Telephone Encounter (Signed)
Received fax from patient's pharmacy requesting refills of Cymbalta, trazodone, and mirtazapine.  However we discontinued the mirtazapine as it was not providing the benefit that we were seeking and resumed trazodone use and would not mix the 2.  Per chart review it looks like a 90-day supply of Cymbalta was sent in on 09/15/2022 to [facility] pharmacy and prior trazodone prescription was sent in to local pharmacy at [facility].  Refills sent of the trazodone to the [facility] pharmacy as Cymbalta not needed at this time per chart review.

## 2022-10-21 DIAGNOSIS — M25561 Pain in right knee: Secondary | ICD-10-CM | POA: Diagnosis not present

## 2022-10-28 ENCOUNTER — Telehealth (INDEPENDENT_AMBULATORY_CARE_PROVIDER_SITE_OTHER): Payer: Medicare HMO | Admitting: Psychiatry

## 2022-10-28 ENCOUNTER — Encounter (HOSPITAL_COMMUNITY): Payer: Self-pay | Admitting: Psychiatry

## 2022-10-28 DIAGNOSIS — E559 Vitamin D deficiency, unspecified: Secondary | ICD-10-CM

## 2022-10-28 DIAGNOSIS — F411 Generalized anxiety disorder: Secondary | ICD-10-CM

## 2022-10-28 DIAGNOSIS — F4000 Agoraphobia, unspecified: Secondary | ICD-10-CM | POA: Diagnosis not present

## 2022-10-28 DIAGNOSIS — G4709 Other insomnia: Secondary | ICD-10-CM | POA: Diagnosis not present

## 2022-10-28 DIAGNOSIS — F410 Panic disorder [episodic paroxysmal anxiety] without agoraphobia: Secondary | ICD-10-CM

## 2022-10-28 DIAGNOSIS — Z79899 Other long term (current) drug therapy: Secondary | ICD-10-CM | POA: Diagnosis not present

## 2022-10-28 DIAGNOSIS — F4310 Post-traumatic stress disorder, unspecified: Secondary | ICD-10-CM | POA: Diagnosis not present

## 2022-10-28 MED ORDER — TRAZODONE HCL 100 MG PO TABS: ORAL_TABLET | ORAL | 0 refills | Status: DC

## 2022-10-28 MED ORDER — DULOXETINE HCL 60 MG PO CPEP: 120.0000 mg | ORAL_CAPSULE | Freq: Every day | ORAL | 3 refills | Status: DC

## 2022-10-28 NOTE — Patient Instructions (Signed)
We increased the trazodone to 100 mg nightly and you can take 50 mg as needed if you wakes during the night.  We otherwise kept her medication same.  When you meet with your stem cell providers be sure they run drug interaction checks if they start you on any supplements or herbs.
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
## 2022-11-01 DIAGNOSIS — M5417 Radiculopathy, lumbosacral region: Secondary | ICD-10-CM | POA: Diagnosis not present

## 2022-11-01 DIAGNOSIS — M9905 Segmental and somatic dysfunction of pelvic region: Secondary | ICD-10-CM | POA: Diagnosis not present

## 2022-11-01 DIAGNOSIS — M5416 Radiculopathy, lumbar region: Secondary | ICD-10-CM | POA: Diagnosis not present

## 2022-11-01 DIAGNOSIS — M5412 Radiculopathy, cervical region: Secondary | ICD-10-CM | POA: Diagnosis not present

## 2022-11-01 DIAGNOSIS — M5030 Other cervical disc degeneration, unspecified cervical region: Secondary | ICD-10-CM | POA: Diagnosis not present

## 2022-11-01 DIAGNOSIS — M25561 Pain in right knee: Secondary | ICD-10-CM | POA: Diagnosis not present

## 2022-11-01 DIAGNOSIS — G4486 Cervicogenic headache: Secondary | ICD-10-CM | POA: Diagnosis not present

## 2022-11-01 DIAGNOSIS — M62838 Other muscle spasm: Secondary | ICD-10-CM | POA: Diagnosis not present

## 2022-11-01 DIAGNOSIS — M9902 Segmental and somatic dysfunction of thoracic region: Secondary | ICD-10-CM | POA: Diagnosis not present

## 2022-11-01 DIAGNOSIS — M9903 Segmental and somatic dysfunction of lumbar region: Secondary | ICD-10-CM | POA: Diagnosis not present

## 2022-11-01 DIAGNOSIS — M9901 Segmental and somatic dysfunction of cervical region: Secondary | ICD-10-CM | POA: Diagnosis not present

## 2022-11-01 DIAGNOSIS — M9906 Segmental and somatic dysfunction of lower extremity: Secondary | ICD-10-CM | POA: Diagnosis not present

## 2022-11-01 DIAGNOSIS — M542 Cervicalgia: Secondary | ICD-10-CM | POA: Diagnosis not present

## 2022-11-08 DIAGNOSIS — M9906 Segmental and somatic dysfunction of lower extremity: Secondary | ICD-10-CM | POA: Diagnosis not present

## 2022-11-08 DIAGNOSIS — M542 Cervicalgia: Secondary | ICD-10-CM | POA: Diagnosis not present

## 2022-11-08 DIAGNOSIS — M25561 Pain in right knee: Secondary | ICD-10-CM | POA: Diagnosis not present

## 2022-11-08 DIAGNOSIS — M9903 Segmental and somatic dysfunction of lumbar region: Secondary | ICD-10-CM | POA: Diagnosis not present

## 2022-11-08 DIAGNOSIS — M62838 Other muscle spasm: Secondary | ICD-10-CM | POA: Diagnosis not present

## 2022-11-08 DIAGNOSIS — M5412 Radiculopathy, cervical region: Secondary | ICD-10-CM | POA: Diagnosis not present

## 2022-11-08 DIAGNOSIS — M9902 Segmental and somatic dysfunction of thoracic region: Secondary | ICD-10-CM | POA: Diagnosis not present

## 2022-11-08 DIAGNOSIS — M9905 Segmental and somatic dysfunction of pelvic region: Secondary | ICD-10-CM | POA: Diagnosis not present

## 2022-11-08 DIAGNOSIS — M5417 Radiculopathy, lumbosacral region: Secondary | ICD-10-CM | POA: Diagnosis not present

## 2022-11-08 DIAGNOSIS — G4486 Cervicogenic headache: Secondary | ICD-10-CM | POA: Diagnosis not present

## 2022-11-08 DIAGNOSIS — M5416 Radiculopathy, lumbar region: Secondary | ICD-10-CM | POA: Diagnosis not present

## 2022-11-08 DIAGNOSIS — M5030 Other cervical disc degeneration, unspecified cervical region: Secondary | ICD-10-CM | POA: Diagnosis not present

## 2022-11-08 DIAGNOSIS — M9901 Segmental and somatic dysfunction of cervical region: Secondary | ICD-10-CM | POA: Diagnosis not present

## 2022-11-11 DIAGNOSIS — M542 Cervicalgia: Secondary | ICD-10-CM | POA: Diagnosis not present

## 2022-11-11 DIAGNOSIS — M5030 Other cervical disc degeneration, unspecified cervical region: Secondary | ICD-10-CM | POA: Diagnosis not present

## 2022-11-11 DIAGNOSIS — M25561 Pain in right knee: Secondary | ICD-10-CM | POA: Diagnosis not present

## 2022-11-11 DIAGNOSIS — M9902 Segmental and somatic dysfunction of thoracic region: Secondary | ICD-10-CM | POA: Diagnosis not present

## 2022-11-11 DIAGNOSIS — M62838 Other muscle spasm: Secondary | ICD-10-CM | POA: Diagnosis not present

## 2022-11-11 DIAGNOSIS — M9903 Segmental and somatic dysfunction of lumbar region: Secondary | ICD-10-CM | POA: Diagnosis not present

## 2022-11-11 DIAGNOSIS — M5417 Radiculopathy, lumbosacral region: Secondary | ICD-10-CM | POA: Diagnosis not present

## 2022-11-11 DIAGNOSIS — M9901 Segmental and somatic dysfunction of cervical region: Secondary | ICD-10-CM | POA: Diagnosis not present

## 2022-11-11 DIAGNOSIS — M9905 Segmental and somatic dysfunction of pelvic region: Secondary | ICD-10-CM | POA: Diagnosis not present

## 2022-11-11 DIAGNOSIS — M5412 Radiculopathy, cervical region: Secondary | ICD-10-CM | POA: Diagnosis not present

## 2022-11-11 DIAGNOSIS — M9906 Segmental and somatic dysfunction of lower extremity: Secondary | ICD-10-CM | POA: Diagnosis not present

## 2022-11-11 DIAGNOSIS — G4486 Cervicogenic headache: Secondary | ICD-10-CM | POA: Diagnosis not present

## 2022-11-11 DIAGNOSIS — M5416 Radiculopathy, lumbar region: Secondary | ICD-10-CM | POA: Diagnosis not present

## 2022-11-15 DIAGNOSIS — G4486 Cervicogenic headache: Secondary | ICD-10-CM | POA: Diagnosis not present

## 2022-11-15 DIAGNOSIS — M25561 Pain in right knee: Secondary | ICD-10-CM | POA: Diagnosis not present

## 2022-11-15 DIAGNOSIS — M5030 Other cervical disc degeneration, unspecified cervical region: Secondary | ICD-10-CM | POA: Diagnosis not present

## 2022-11-15 DIAGNOSIS — M5417 Radiculopathy, lumbosacral region: Secondary | ICD-10-CM | POA: Diagnosis not present

## 2022-11-15 DIAGNOSIS — M542 Cervicalgia: Secondary | ICD-10-CM | POA: Diagnosis not present

## 2022-11-15 DIAGNOSIS — M9905 Segmental and somatic dysfunction of pelvic region: Secondary | ICD-10-CM | POA: Diagnosis not present

## 2022-11-15 DIAGNOSIS — M9901 Segmental and somatic dysfunction of cervical region: Secondary | ICD-10-CM | POA: Diagnosis not present

## 2022-11-15 DIAGNOSIS — M5416 Radiculopathy, lumbar region: Secondary | ICD-10-CM | POA: Diagnosis not present

## 2022-11-15 DIAGNOSIS — M62838 Other muscle spasm: Secondary | ICD-10-CM | POA: Diagnosis not present

## 2022-11-15 DIAGNOSIS — M5412 Radiculopathy, cervical region: Secondary | ICD-10-CM | POA: Diagnosis not present

## 2022-11-15 DIAGNOSIS — M9903 Segmental and somatic dysfunction of lumbar region: Secondary | ICD-10-CM | POA: Diagnosis not present

## 2022-11-15 DIAGNOSIS — M9902 Segmental and somatic dysfunction of thoracic region: Secondary | ICD-10-CM | POA: Diagnosis not present

## 2022-11-15 DIAGNOSIS — M9906 Segmental and somatic dysfunction of lower extremity: Secondary | ICD-10-CM | POA: Diagnosis not present

## 2022-11-18 DIAGNOSIS — M542 Cervicalgia: Secondary | ICD-10-CM | POA: Diagnosis not present

## 2022-11-18 DIAGNOSIS — M25561 Pain in right knee: Secondary | ICD-10-CM | POA: Diagnosis not present

## 2022-11-18 DIAGNOSIS — M9901 Segmental and somatic dysfunction of cervical region: Secondary | ICD-10-CM | POA: Diagnosis not present

## 2022-11-18 DIAGNOSIS — M9905 Segmental and somatic dysfunction of pelvic region: Secondary | ICD-10-CM | POA: Diagnosis not present

## 2022-11-18 DIAGNOSIS — M9906 Segmental and somatic dysfunction of lower extremity: Secondary | ICD-10-CM | POA: Diagnosis not present

## 2022-11-18 DIAGNOSIS — M5416 Radiculopathy, lumbar region: Secondary | ICD-10-CM | POA: Diagnosis not present

## 2022-11-18 DIAGNOSIS — M9902 Segmental and somatic dysfunction of thoracic region: Secondary | ICD-10-CM | POA: Diagnosis not present

## 2022-11-18 DIAGNOSIS — M5412 Radiculopathy, cervical region: Secondary | ICD-10-CM | POA: Diagnosis not present

## 2022-11-18 DIAGNOSIS — M62838 Other muscle spasm: Secondary | ICD-10-CM | POA: Diagnosis not present

## 2022-11-18 DIAGNOSIS — M9903 Segmental and somatic dysfunction of lumbar region: Secondary | ICD-10-CM | POA: Diagnosis not present

## 2022-11-18 DIAGNOSIS — M5030 Other cervical disc degeneration, unspecified cervical region: Secondary | ICD-10-CM | POA: Diagnosis not present

## 2022-11-18 DIAGNOSIS — M5417 Radiculopathy, lumbosacral region: Secondary | ICD-10-CM | POA: Diagnosis not present

## 2022-11-18 DIAGNOSIS — G4486 Cervicogenic headache: Secondary | ICD-10-CM | POA: Diagnosis not present

## 2022-11-22 DIAGNOSIS — M9901 Segmental and somatic dysfunction of cervical region: Secondary | ICD-10-CM | POA: Diagnosis not present

## 2022-11-22 DIAGNOSIS — M5416 Radiculopathy, lumbar region: Secondary | ICD-10-CM | POA: Diagnosis not present

## 2022-11-22 DIAGNOSIS — M9905 Segmental and somatic dysfunction of pelvic region: Secondary | ICD-10-CM | POA: Diagnosis not present

## 2022-11-22 DIAGNOSIS — M62838 Other muscle spasm: Secondary | ICD-10-CM | POA: Diagnosis not present

## 2022-11-22 DIAGNOSIS — M5412 Radiculopathy, cervical region: Secondary | ICD-10-CM | POA: Diagnosis not present

## 2022-11-22 DIAGNOSIS — M542 Cervicalgia: Secondary | ICD-10-CM | POA: Diagnosis not present

## 2022-11-22 DIAGNOSIS — M9906 Segmental and somatic dysfunction of lower extremity: Secondary | ICD-10-CM | POA: Diagnosis not present

## 2022-11-22 DIAGNOSIS — M25561 Pain in right knee: Secondary | ICD-10-CM | POA: Diagnosis not present

## 2022-11-22 DIAGNOSIS — M5030 Other cervical disc degeneration, unspecified cervical region: Secondary | ICD-10-CM | POA: Diagnosis not present

## 2022-11-22 DIAGNOSIS — M9902 Segmental and somatic dysfunction of thoracic region: Secondary | ICD-10-CM | POA: Diagnosis not present

## 2022-11-22 DIAGNOSIS — M9903 Segmental and somatic dysfunction of lumbar region: Secondary | ICD-10-CM | POA: Diagnosis not present

## 2022-11-22 DIAGNOSIS — M5417 Radiculopathy, lumbosacral region: Secondary | ICD-10-CM | POA: Diagnosis not present

## 2022-11-22 DIAGNOSIS — G4486 Cervicogenic headache: Secondary | ICD-10-CM | POA: Diagnosis not present

## 2022-11-29 DIAGNOSIS — M9905 Segmental and somatic dysfunction of pelvic region: Secondary | ICD-10-CM | POA: Diagnosis not present

## 2022-11-29 DIAGNOSIS — G4486 Cervicogenic headache: Secondary | ICD-10-CM | POA: Diagnosis not present

## 2022-11-29 DIAGNOSIS — M9906 Segmental and somatic dysfunction of lower extremity: Secondary | ICD-10-CM | POA: Diagnosis not present

## 2022-11-29 DIAGNOSIS — M62838 Other muscle spasm: Secondary | ICD-10-CM | POA: Diagnosis not present

## 2022-11-29 DIAGNOSIS — M9901 Segmental and somatic dysfunction of cervical region: Secondary | ICD-10-CM | POA: Diagnosis not present

## 2022-11-29 DIAGNOSIS — M9902 Segmental and somatic dysfunction of thoracic region: Secondary | ICD-10-CM | POA: Diagnosis not present

## 2022-11-29 DIAGNOSIS — M5030 Other cervical disc degeneration, unspecified cervical region: Secondary | ICD-10-CM | POA: Diagnosis not present

## 2022-11-29 DIAGNOSIS — M25561 Pain in right knee: Secondary | ICD-10-CM | POA: Diagnosis not present

## 2022-11-29 DIAGNOSIS — M5412 Radiculopathy, cervical region: Secondary | ICD-10-CM | POA: Diagnosis not present

## 2022-11-29 DIAGNOSIS — M5417 Radiculopathy, lumbosacral region: Secondary | ICD-10-CM | POA: Diagnosis not present

## 2022-11-29 DIAGNOSIS — M542 Cervicalgia: Secondary | ICD-10-CM | POA: Diagnosis not present

## 2022-11-29 DIAGNOSIS — M9903 Segmental and somatic dysfunction of lumbar region: Secondary | ICD-10-CM | POA: Diagnosis not present

## 2022-11-29 DIAGNOSIS — M5416 Radiculopathy, lumbar region: Secondary | ICD-10-CM | POA: Diagnosis not present

## 2022-11-30 DIAGNOSIS — M5412 Radiculopathy, cervical region: Secondary | ICD-10-CM | POA: Diagnosis not present

## 2022-11-30 DIAGNOSIS — M9906 Segmental and somatic dysfunction of lower extremity: Secondary | ICD-10-CM | POA: Diagnosis not present

## 2022-11-30 DIAGNOSIS — M9901 Segmental and somatic dysfunction of cervical region: Secondary | ICD-10-CM | POA: Diagnosis not present

## 2022-11-30 DIAGNOSIS — M5416 Radiculopathy, lumbar region: Secondary | ICD-10-CM | POA: Diagnosis not present

## 2022-11-30 DIAGNOSIS — M9905 Segmental and somatic dysfunction of pelvic region: Secondary | ICD-10-CM | POA: Diagnosis not present

## 2022-11-30 DIAGNOSIS — G4486 Cervicogenic headache: Secondary | ICD-10-CM | POA: Diagnosis not present

## 2022-11-30 DIAGNOSIS — M5417 Radiculopathy, lumbosacral region: Secondary | ICD-10-CM | POA: Diagnosis not present

## 2022-11-30 DIAGNOSIS — M25561 Pain in right knee: Secondary | ICD-10-CM | POA: Diagnosis not present

## 2022-11-30 DIAGNOSIS — M62838 Other muscle spasm: Secondary | ICD-10-CM | POA: Diagnosis not present

## 2022-11-30 DIAGNOSIS — M542 Cervicalgia: Secondary | ICD-10-CM | POA: Diagnosis not present

## 2022-11-30 DIAGNOSIS — M9903 Segmental and somatic dysfunction of lumbar region: Secondary | ICD-10-CM | POA: Diagnosis not present

## 2022-11-30 DIAGNOSIS — M9902 Segmental and somatic dysfunction of thoracic region: Secondary | ICD-10-CM | POA: Diagnosis not present

## 2022-11-30 DIAGNOSIS — M5030 Other cervical disc degeneration, unspecified cervical region: Secondary | ICD-10-CM | POA: Diagnosis not present

## 2022-12-14 DIAGNOSIS — M9903 Segmental and somatic dysfunction of lumbar region: Secondary | ICD-10-CM | POA: Diagnosis not present

## 2022-12-14 DIAGNOSIS — M9905 Segmental and somatic dysfunction of pelvic region: Secondary | ICD-10-CM | POA: Diagnosis not present

## 2022-12-14 DIAGNOSIS — M9902 Segmental and somatic dysfunction of thoracic region: Secondary | ICD-10-CM | POA: Diagnosis not present

## 2022-12-14 DIAGNOSIS — M5412 Radiculopathy, cervical region: Secondary | ICD-10-CM | POA: Diagnosis not present

## 2022-12-14 DIAGNOSIS — M62838 Other muscle spasm: Secondary | ICD-10-CM | POA: Diagnosis not present

## 2022-12-14 DIAGNOSIS — M5030 Other cervical disc degeneration, unspecified cervical region: Secondary | ICD-10-CM | POA: Diagnosis not present

## 2022-12-14 DIAGNOSIS — M9901 Segmental and somatic dysfunction of cervical region: Secondary | ICD-10-CM | POA: Diagnosis not present

## 2022-12-14 DIAGNOSIS — G4486 Cervicogenic headache: Secondary | ICD-10-CM | POA: Diagnosis not present

## 2022-12-14 DIAGNOSIS — M9906 Segmental and somatic dysfunction of lower extremity: Secondary | ICD-10-CM | POA: Diagnosis not present

## 2022-12-14 DIAGNOSIS — M5417 Radiculopathy, lumbosacral region: Secondary | ICD-10-CM | POA: Diagnosis not present

## 2022-12-14 DIAGNOSIS — M25561 Pain in right knee: Secondary | ICD-10-CM | POA: Diagnosis not present

## 2022-12-14 DIAGNOSIS — M542 Cervicalgia: Secondary | ICD-10-CM | POA: Diagnosis not present

## 2022-12-14 DIAGNOSIS — M5416 Radiculopathy, lumbar region: Secondary | ICD-10-CM | POA: Diagnosis not present

## 2022-12-16 DIAGNOSIS — M9902 Segmental and somatic dysfunction of thoracic region: Secondary | ICD-10-CM | POA: Diagnosis not present

## 2022-12-16 DIAGNOSIS — M542 Cervicalgia: Secondary | ICD-10-CM | POA: Diagnosis not present

## 2022-12-16 DIAGNOSIS — M9906 Segmental and somatic dysfunction of lower extremity: Secondary | ICD-10-CM | POA: Diagnosis not present

## 2022-12-16 DIAGNOSIS — M5417 Radiculopathy, lumbosacral region: Secondary | ICD-10-CM | POA: Diagnosis not present

## 2022-12-16 DIAGNOSIS — M9901 Segmental and somatic dysfunction of cervical region: Secondary | ICD-10-CM | POA: Diagnosis not present

## 2022-12-16 DIAGNOSIS — M5030 Other cervical disc degeneration, unspecified cervical region: Secondary | ICD-10-CM | POA: Diagnosis not present

## 2022-12-16 DIAGNOSIS — M9903 Segmental and somatic dysfunction of lumbar region: Secondary | ICD-10-CM | POA: Diagnosis not present

## 2022-12-16 DIAGNOSIS — M5412 Radiculopathy, cervical region: Secondary | ICD-10-CM | POA: Diagnosis not present

## 2022-12-16 DIAGNOSIS — G4486 Cervicogenic headache: Secondary | ICD-10-CM | POA: Diagnosis not present

## 2022-12-16 DIAGNOSIS — M62838 Other muscle spasm: Secondary | ICD-10-CM | POA: Diagnosis not present

## 2022-12-16 DIAGNOSIS — M9905 Segmental and somatic dysfunction of pelvic region: Secondary | ICD-10-CM | POA: Diagnosis not present

## 2022-12-16 DIAGNOSIS — M5416 Radiculopathy, lumbar region: Secondary | ICD-10-CM | POA: Diagnosis not present

## 2022-12-16 DIAGNOSIS — M25561 Pain in right knee: Secondary | ICD-10-CM | POA: Diagnosis not present

## 2022-12-21 DIAGNOSIS — M5412 Radiculopathy, cervical region: Secondary | ICD-10-CM | POA: Diagnosis not present

## 2022-12-21 DIAGNOSIS — M9906 Segmental and somatic dysfunction of lower extremity: Secondary | ICD-10-CM | POA: Diagnosis not present

## 2022-12-21 DIAGNOSIS — M62838 Other muscle spasm: Secondary | ICD-10-CM | POA: Diagnosis not present

## 2022-12-21 DIAGNOSIS — M791 Myalgia, unspecified site: Secondary | ICD-10-CM | POA: Diagnosis not present

## 2022-12-21 DIAGNOSIS — M9903 Segmental and somatic dysfunction of lumbar region: Secondary | ICD-10-CM | POA: Diagnosis not present

## 2022-12-21 DIAGNOSIS — G4486 Cervicogenic headache: Secondary | ICD-10-CM | POA: Diagnosis not present

## 2022-12-21 DIAGNOSIS — M5417 Radiculopathy, lumbosacral region: Secondary | ICD-10-CM | POA: Diagnosis not present

## 2022-12-21 DIAGNOSIS — M9902 Segmental and somatic dysfunction of thoracic region: Secondary | ICD-10-CM | POA: Diagnosis not present

## 2022-12-21 DIAGNOSIS — M25561 Pain in right knee: Secondary | ICD-10-CM | POA: Diagnosis not present

## 2022-12-21 DIAGNOSIS — M542 Cervicalgia: Secondary | ICD-10-CM | POA: Diagnosis not present

## 2022-12-21 DIAGNOSIS — M5416 Radiculopathy, lumbar region: Secondary | ICD-10-CM | POA: Diagnosis not present

## 2022-12-21 DIAGNOSIS — M5030 Other cervical disc degeneration, unspecified cervical region: Secondary | ICD-10-CM | POA: Diagnosis not present

## 2022-12-21 DIAGNOSIS — M9905 Segmental and somatic dysfunction of pelvic region: Secondary | ICD-10-CM | POA: Diagnosis not present

## 2022-12-21 DIAGNOSIS — M9901 Segmental and somatic dysfunction of cervical region: Secondary | ICD-10-CM | POA: Diagnosis not present

## 2022-12-22 ENCOUNTER — Other Ambulatory Visit (HOSPITAL_COMMUNITY): Payer: Self-pay | Admitting: Psychiatry

## 2022-12-22 DIAGNOSIS — G4709 Other insomnia: Secondary | ICD-10-CM

## 2022-12-23 DIAGNOSIS — M9905 Segmental and somatic dysfunction of pelvic region: Secondary | ICD-10-CM | POA: Diagnosis not present

## 2022-12-23 DIAGNOSIS — M62838 Other muscle spasm: Secondary | ICD-10-CM | POA: Diagnosis not present

## 2022-12-23 DIAGNOSIS — M791 Myalgia, unspecified site: Secondary | ICD-10-CM | POA: Diagnosis not present

## 2022-12-23 DIAGNOSIS — G4486 Cervicogenic headache: Secondary | ICD-10-CM | POA: Diagnosis not present

## 2022-12-23 DIAGNOSIS — M9903 Segmental and somatic dysfunction of lumbar region: Secondary | ICD-10-CM | POA: Diagnosis not present

## 2022-12-23 DIAGNOSIS — M5417 Radiculopathy, lumbosacral region: Secondary | ICD-10-CM | POA: Diagnosis not present

## 2022-12-23 DIAGNOSIS — M5416 Radiculopathy, lumbar region: Secondary | ICD-10-CM | POA: Diagnosis not present

## 2022-12-23 DIAGNOSIS — M5412 Radiculopathy, cervical region: Secondary | ICD-10-CM | POA: Diagnosis not present

## 2022-12-23 DIAGNOSIS — M542 Cervicalgia: Secondary | ICD-10-CM | POA: Diagnosis not present

## 2022-12-23 DIAGNOSIS — M9906 Segmental and somatic dysfunction of lower extremity: Secondary | ICD-10-CM | POA: Diagnosis not present

## 2022-12-23 DIAGNOSIS — M5030 Other cervical disc degeneration, unspecified cervical region: Secondary | ICD-10-CM | POA: Diagnosis not present

## 2022-12-23 DIAGNOSIS — M9901 Segmental and somatic dysfunction of cervical region: Secondary | ICD-10-CM | POA: Diagnosis not present

## 2022-12-23 DIAGNOSIS — M25561 Pain in right knee: Secondary | ICD-10-CM | POA: Diagnosis not present

## 2022-12-23 DIAGNOSIS — M9902 Segmental and somatic dysfunction of thoracic region: Secondary | ICD-10-CM | POA: Diagnosis not present

## 2022-12-28 DIAGNOSIS — M9902 Segmental and somatic dysfunction of thoracic region: Secondary | ICD-10-CM | POA: Diagnosis not present

## 2022-12-28 DIAGNOSIS — M542 Cervicalgia: Secondary | ICD-10-CM | POA: Diagnosis not present

## 2022-12-28 DIAGNOSIS — M9901 Segmental and somatic dysfunction of cervical region: Secondary | ICD-10-CM | POA: Diagnosis not present

## 2022-12-28 DIAGNOSIS — M5030 Other cervical disc degeneration, unspecified cervical region: Secondary | ICD-10-CM | POA: Diagnosis not present

## 2022-12-28 DIAGNOSIS — M5412 Radiculopathy, cervical region: Secondary | ICD-10-CM | POA: Diagnosis not present

## 2022-12-28 DIAGNOSIS — M9905 Segmental and somatic dysfunction of pelvic region: Secondary | ICD-10-CM | POA: Diagnosis not present

## 2022-12-28 DIAGNOSIS — M62838 Other muscle spasm: Secondary | ICD-10-CM | POA: Diagnosis not present

## 2022-12-28 DIAGNOSIS — M791 Myalgia, unspecified site: Secondary | ICD-10-CM | POA: Diagnosis not present

## 2022-12-28 DIAGNOSIS — M9906 Segmental and somatic dysfunction of lower extremity: Secondary | ICD-10-CM | POA: Diagnosis not present

## 2022-12-28 DIAGNOSIS — M9903 Segmental and somatic dysfunction of lumbar region: Secondary | ICD-10-CM | POA: Diagnosis not present

## 2022-12-28 DIAGNOSIS — M25561 Pain in right knee: Secondary | ICD-10-CM | POA: Diagnosis not present

## 2022-12-28 DIAGNOSIS — M5416 Radiculopathy, lumbar region: Secondary | ICD-10-CM | POA: Diagnosis not present

## 2022-12-28 DIAGNOSIS — M5417 Radiculopathy, lumbosacral region: Secondary | ICD-10-CM | POA: Diagnosis not present

## 2022-12-28 DIAGNOSIS — G4486 Cervicogenic headache: Secondary | ICD-10-CM | POA: Diagnosis not present

## 2022-12-30 DIAGNOSIS — M9902 Segmental and somatic dysfunction of thoracic region: Secondary | ICD-10-CM | POA: Diagnosis not present

## 2022-12-30 DIAGNOSIS — M5417 Radiculopathy, lumbosacral region: Secondary | ICD-10-CM | POA: Diagnosis not present

## 2022-12-30 DIAGNOSIS — M542 Cervicalgia: Secondary | ICD-10-CM | POA: Diagnosis not present

## 2022-12-30 DIAGNOSIS — M9901 Segmental and somatic dysfunction of cervical region: Secondary | ICD-10-CM | POA: Diagnosis not present

## 2022-12-30 DIAGNOSIS — M5030 Other cervical disc degeneration, unspecified cervical region: Secondary | ICD-10-CM | POA: Diagnosis not present

## 2022-12-30 DIAGNOSIS — M5416 Radiculopathy, lumbar region: Secondary | ICD-10-CM | POA: Diagnosis not present

## 2022-12-30 DIAGNOSIS — M25561 Pain in right knee: Secondary | ICD-10-CM | POA: Diagnosis not present

## 2022-12-30 DIAGNOSIS — M9905 Segmental and somatic dysfunction of pelvic region: Secondary | ICD-10-CM | POA: Diagnosis not present

## 2022-12-30 DIAGNOSIS — M791 Myalgia, unspecified site: Secondary | ICD-10-CM | POA: Diagnosis not present

## 2022-12-30 DIAGNOSIS — M9903 Segmental and somatic dysfunction of lumbar region: Secondary | ICD-10-CM | POA: Diagnosis not present

## 2022-12-30 DIAGNOSIS — M9906 Segmental and somatic dysfunction of lower extremity: Secondary | ICD-10-CM | POA: Diagnosis not present

## 2022-12-30 DIAGNOSIS — G4486 Cervicogenic headache: Secondary | ICD-10-CM | POA: Diagnosis not present

## 2022-12-30 DIAGNOSIS — M5412 Radiculopathy, cervical region: Secondary | ICD-10-CM | POA: Diagnosis not present

## 2022-12-30 DIAGNOSIS — M62838 Other muscle spasm: Secondary | ICD-10-CM | POA: Diagnosis not present

## 2023-01-04 DIAGNOSIS — M25561 Pain in right knee: Secondary | ICD-10-CM | POA: Diagnosis not present

## 2023-01-04 DIAGNOSIS — G4486 Cervicogenic headache: Secondary | ICD-10-CM | POA: Diagnosis not present

## 2023-01-04 DIAGNOSIS — M62838 Other muscle spasm: Secondary | ICD-10-CM | POA: Diagnosis not present

## 2023-01-04 DIAGNOSIS — M9905 Segmental and somatic dysfunction of pelvic region: Secondary | ICD-10-CM | POA: Diagnosis not present

## 2023-01-04 DIAGNOSIS — M5412 Radiculopathy, cervical region: Secondary | ICD-10-CM | POA: Diagnosis not present

## 2023-01-04 DIAGNOSIS — M5030 Other cervical disc degeneration, unspecified cervical region: Secondary | ICD-10-CM | POA: Diagnosis not present

## 2023-01-04 DIAGNOSIS — M9901 Segmental and somatic dysfunction of cervical region: Secondary | ICD-10-CM | POA: Diagnosis not present

## 2023-01-04 DIAGNOSIS — M542 Cervicalgia: Secondary | ICD-10-CM | POA: Diagnosis not present

## 2023-01-04 DIAGNOSIS — M9903 Segmental and somatic dysfunction of lumbar region: Secondary | ICD-10-CM | POA: Diagnosis not present

## 2023-01-04 DIAGNOSIS — M791 Myalgia, unspecified site: Secondary | ICD-10-CM | POA: Diagnosis not present

## 2023-01-04 DIAGNOSIS — M9902 Segmental and somatic dysfunction of thoracic region: Secondary | ICD-10-CM | POA: Diagnosis not present

## 2023-01-04 DIAGNOSIS — M5417 Radiculopathy, lumbosacral region: Secondary | ICD-10-CM | POA: Diagnosis not present

## 2023-01-04 DIAGNOSIS — M5416 Radiculopathy, lumbar region: Secondary | ICD-10-CM | POA: Diagnosis not present

## 2023-01-04 DIAGNOSIS — M9906 Segmental and somatic dysfunction of lower extremity: Secondary | ICD-10-CM | POA: Diagnosis not present

## 2023-01-05 DIAGNOSIS — M25561 Pain in right knee: Secondary | ICD-10-CM | POA: Diagnosis not present

## 2023-01-05 DIAGNOSIS — M542 Cervicalgia: Secondary | ICD-10-CM | POA: Diagnosis not present

## 2023-01-05 DIAGNOSIS — M9901 Segmental and somatic dysfunction of cervical region: Secondary | ICD-10-CM | POA: Diagnosis not present

## 2023-01-05 DIAGNOSIS — M5030 Other cervical disc degeneration, unspecified cervical region: Secondary | ICD-10-CM | POA: Diagnosis not present

## 2023-01-05 DIAGNOSIS — M9903 Segmental and somatic dysfunction of lumbar region: Secondary | ICD-10-CM | POA: Diagnosis not present

## 2023-01-05 DIAGNOSIS — M9906 Segmental and somatic dysfunction of lower extremity: Secondary | ICD-10-CM | POA: Diagnosis not present

## 2023-01-05 DIAGNOSIS — G4486 Cervicogenic headache: Secondary | ICD-10-CM | POA: Diagnosis not present

## 2023-01-05 DIAGNOSIS — M5417 Radiculopathy, lumbosacral region: Secondary | ICD-10-CM | POA: Diagnosis not present

## 2023-01-05 DIAGNOSIS — M9902 Segmental and somatic dysfunction of thoracic region: Secondary | ICD-10-CM | POA: Diagnosis not present

## 2023-01-05 DIAGNOSIS — M9905 Segmental and somatic dysfunction of pelvic region: Secondary | ICD-10-CM | POA: Diagnosis not present

## 2023-01-05 DIAGNOSIS — M791 Myalgia, unspecified site: Secondary | ICD-10-CM | POA: Diagnosis not present

## 2023-01-05 DIAGNOSIS — M62838 Other muscle spasm: Secondary | ICD-10-CM | POA: Diagnosis not present

## 2023-01-05 DIAGNOSIS — M5412 Radiculopathy, cervical region: Secondary | ICD-10-CM | POA: Diagnosis not present

## 2023-01-05 DIAGNOSIS — M5416 Radiculopathy, lumbar region: Secondary | ICD-10-CM | POA: Diagnosis not present

## 2023-01-18 DIAGNOSIS — M62838 Other muscle spasm: Secondary | ICD-10-CM | POA: Diagnosis not present

## 2023-01-18 DIAGNOSIS — M9903 Segmental and somatic dysfunction of lumbar region: Secondary | ICD-10-CM | POA: Diagnosis not present

## 2023-01-18 DIAGNOSIS — M791 Myalgia, unspecified site: Secondary | ICD-10-CM | POA: Diagnosis not present

## 2023-01-18 DIAGNOSIS — M9902 Segmental and somatic dysfunction of thoracic region: Secondary | ICD-10-CM | POA: Diagnosis not present

## 2023-01-18 DIAGNOSIS — M25561 Pain in right knee: Secondary | ICD-10-CM | POA: Diagnosis not present

## 2023-01-18 DIAGNOSIS — M5417 Radiculopathy, lumbosacral region: Secondary | ICD-10-CM | POA: Diagnosis not present

## 2023-01-18 DIAGNOSIS — M5416 Radiculopathy, lumbar region: Secondary | ICD-10-CM | POA: Diagnosis not present

## 2023-01-18 DIAGNOSIS — G4486 Cervicogenic headache: Secondary | ICD-10-CM | POA: Diagnosis not present

## 2023-01-18 DIAGNOSIS — M5030 Other cervical disc degeneration, unspecified cervical region: Secondary | ICD-10-CM | POA: Diagnosis not present

## 2023-01-18 DIAGNOSIS — M9906 Segmental and somatic dysfunction of lower extremity: Secondary | ICD-10-CM | POA: Diagnosis not present

## 2023-01-18 DIAGNOSIS — M9901 Segmental and somatic dysfunction of cervical region: Secondary | ICD-10-CM | POA: Diagnosis not present

## 2023-01-18 DIAGNOSIS — M5412 Radiculopathy, cervical region: Secondary | ICD-10-CM | POA: Diagnosis not present

## 2023-01-18 DIAGNOSIS — M9905 Segmental and somatic dysfunction of pelvic region: Secondary | ICD-10-CM | POA: Diagnosis not present

## 2023-01-18 DIAGNOSIS — M542 Cervicalgia: Secondary | ICD-10-CM | POA: Diagnosis not present

## 2023-01-20 DIAGNOSIS — M25561 Pain in right knee: Secondary | ICD-10-CM | POA: Diagnosis not present

## 2023-01-20 DIAGNOSIS — M9905 Segmental and somatic dysfunction of pelvic region: Secondary | ICD-10-CM | POA: Diagnosis not present

## 2023-01-20 DIAGNOSIS — M5417 Radiculopathy, lumbosacral region: Secondary | ICD-10-CM | POA: Diagnosis not present

## 2023-01-20 DIAGNOSIS — M542 Cervicalgia: Secondary | ICD-10-CM | POA: Diagnosis not present

## 2023-01-20 DIAGNOSIS — G4486 Cervicogenic headache: Secondary | ICD-10-CM | POA: Diagnosis not present

## 2023-01-20 DIAGNOSIS — M5030 Other cervical disc degeneration, unspecified cervical region: Secondary | ICD-10-CM | POA: Diagnosis not present

## 2023-01-20 DIAGNOSIS — M9902 Segmental and somatic dysfunction of thoracic region: Secondary | ICD-10-CM | POA: Diagnosis not present

## 2023-01-20 DIAGNOSIS — M9901 Segmental and somatic dysfunction of cervical region: Secondary | ICD-10-CM | POA: Diagnosis not present

## 2023-01-20 DIAGNOSIS — M9903 Segmental and somatic dysfunction of lumbar region: Secondary | ICD-10-CM | POA: Diagnosis not present

## 2023-01-20 DIAGNOSIS — M9906 Segmental and somatic dysfunction of lower extremity: Secondary | ICD-10-CM | POA: Diagnosis not present

## 2023-01-20 DIAGNOSIS — M5412 Radiculopathy, cervical region: Secondary | ICD-10-CM | POA: Diagnosis not present

## 2023-01-20 DIAGNOSIS — M5416 Radiculopathy, lumbar region: Secondary | ICD-10-CM | POA: Diagnosis not present

## 2023-01-20 DIAGNOSIS — M791 Myalgia, unspecified site: Secondary | ICD-10-CM | POA: Diagnosis not present

## 2023-01-20 DIAGNOSIS — M62838 Other muscle spasm: Secondary | ICD-10-CM | POA: Diagnosis not present

## 2023-01-25 DIAGNOSIS — M9903 Segmental and somatic dysfunction of lumbar region: Secondary | ICD-10-CM | POA: Diagnosis not present

## 2023-01-25 DIAGNOSIS — M791 Myalgia, unspecified site: Secondary | ICD-10-CM | POA: Diagnosis not present

## 2023-01-25 DIAGNOSIS — G4486 Cervicogenic headache: Secondary | ICD-10-CM | POA: Diagnosis not present

## 2023-01-25 DIAGNOSIS — M9906 Segmental and somatic dysfunction of lower extremity: Secondary | ICD-10-CM | POA: Diagnosis not present

## 2023-01-25 DIAGNOSIS — M9902 Segmental and somatic dysfunction of thoracic region: Secondary | ICD-10-CM | POA: Diagnosis not present

## 2023-01-25 DIAGNOSIS — M5412 Radiculopathy, cervical region: Secondary | ICD-10-CM | POA: Diagnosis not present

## 2023-01-25 DIAGNOSIS — M5030 Other cervical disc degeneration, unspecified cervical region: Secondary | ICD-10-CM | POA: Diagnosis not present

## 2023-01-25 DIAGNOSIS — M5416 Radiculopathy, lumbar region: Secondary | ICD-10-CM | POA: Diagnosis not present

## 2023-01-25 DIAGNOSIS — M9901 Segmental and somatic dysfunction of cervical region: Secondary | ICD-10-CM | POA: Diagnosis not present

## 2023-01-25 DIAGNOSIS — M5417 Radiculopathy, lumbosacral region: Secondary | ICD-10-CM | POA: Diagnosis not present

## 2023-01-25 DIAGNOSIS — M542 Cervicalgia: Secondary | ICD-10-CM | POA: Diagnosis not present

## 2023-01-25 DIAGNOSIS — M25561 Pain in right knee: Secondary | ICD-10-CM | POA: Diagnosis not present

## 2023-01-25 DIAGNOSIS — M9905 Segmental and somatic dysfunction of pelvic region: Secondary | ICD-10-CM | POA: Diagnosis not present

## 2023-01-25 DIAGNOSIS — M62838 Other muscle spasm: Secondary | ICD-10-CM | POA: Diagnosis not present

## 2023-01-27 DIAGNOSIS — M62838 Other muscle spasm: Secondary | ICD-10-CM | POA: Diagnosis not present

## 2023-01-27 DIAGNOSIS — M9901 Segmental and somatic dysfunction of cervical region: Secondary | ICD-10-CM | POA: Diagnosis not present

## 2023-01-27 DIAGNOSIS — M5030 Other cervical disc degeneration, unspecified cervical region: Secondary | ICD-10-CM | POA: Diagnosis not present

## 2023-01-27 DIAGNOSIS — M5416 Radiculopathy, lumbar region: Secondary | ICD-10-CM | POA: Diagnosis not present

## 2023-01-27 DIAGNOSIS — M9903 Segmental and somatic dysfunction of lumbar region: Secondary | ICD-10-CM | POA: Diagnosis not present

## 2023-01-27 DIAGNOSIS — M25561 Pain in right knee: Secondary | ICD-10-CM | POA: Diagnosis not present

## 2023-01-27 DIAGNOSIS — M9905 Segmental and somatic dysfunction of pelvic region: Secondary | ICD-10-CM | POA: Diagnosis not present

## 2023-01-27 DIAGNOSIS — M9902 Segmental and somatic dysfunction of thoracic region: Secondary | ICD-10-CM | POA: Diagnosis not present

## 2023-01-27 DIAGNOSIS — M9906 Segmental and somatic dysfunction of lower extremity: Secondary | ICD-10-CM | POA: Diagnosis not present

## 2023-02-03 DIAGNOSIS — M542 Cervicalgia: Secondary | ICD-10-CM | POA: Diagnosis not present

## 2023-02-03 DIAGNOSIS — M9903 Segmental and somatic dysfunction of lumbar region: Secondary | ICD-10-CM | POA: Diagnosis not present

## 2023-02-03 DIAGNOSIS — M9906 Segmental and somatic dysfunction of lower extremity: Secondary | ICD-10-CM | POA: Diagnosis not present

## 2023-02-03 DIAGNOSIS — G4486 Cervicogenic headache: Secondary | ICD-10-CM | POA: Diagnosis not present

## 2023-02-03 DIAGNOSIS — M9902 Segmental and somatic dysfunction of thoracic region: Secondary | ICD-10-CM | POA: Diagnosis not present

## 2023-02-03 DIAGNOSIS — M5412 Radiculopathy, cervical region: Secondary | ICD-10-CM | POA: Diagnosis not present

## 2023-02-03 DIAGNOSIS — M791 Myalgia, unspecified site: Secondary | ICD-10-CM | POA: Diagnosis not present

## 2023-02-03 DIAGNOSIS — M25561 Pain in right knee: Secondary | ICD-10-CM | POA: Diagnosis not present

## 2023-02-03 DIAGNOSIS — M25562 Pain in left knee: Secondary | ICD-10-CM | POA: Diagnosis not present

## 2023-02-03 DIAGNOSIS — M9905 Segmental and somatic dysfunction of pelvic region: Secondary | ICD-10-CM | POA: Diagnosis not present

## 2023-02-03 DIAGNOSIS — M2351 Chronic instability of knee, right knee: Secondary | ICD-10-CM | POA: Diagnosis not present

## 2023-02-03 DIAGNOSIS — M1711 Unilateral primary osteoarthritis, right knee: Secondary | ICD-10-CM | POA: Diagnosis not present

## 2023-02-03 DIAGNOSIS — M5030 Other cervical disc degeneration, unspecified cervical region: Secondary | ICD-10-CM | POA: Diagnosis not present

## 2023-02-03 DIAGNOSIS — M9901 Segmental and somatic dysfunction of cervical region: Secondary | ICD-10-CM | POA: Diagnosis not present

## 2023-02-03 DIAGNOSIS — M5416 Radiculopathy, lumbar region: Secondary | ICD-10-CM | POA: Diagnosis not present

## 2023-02-03 DIAGNOSIS — M62838 Other muscle spasm: Secondary | ICD-10-CM | POA: Diagnosis not present

## 2023-02-03 DIAGNOSIS — M5417 Radiculopathy, lumbosacral region: Secondary | ICD-10-CM | POA: Diagnosis not present

## 2023-02-08 DIAGNOSIS — M1711 Unilateral primary osteoarthritis, right knee: Secondary | ICD-10-CM | POA: Diagnosis not present

## 2023-02-08 DIAGNOSIS — M5412 Radiculopathy, cervical region: Secondary | ICD-10-CM | POA: Diagnosis not present

## 2023-02-08 DIAGNOSIS — M5417 Radiculopathy, lumbosacral region: Secondary | ICD-10-CM | POA: Diagnosis not present

## 2023-02-08 DIAGNOSIS — M5416 Radiculopathy, lumbar region: Secondary | ICD-10-CM | POA: Diagnosis not present

## 2023-02-08 DIAGNOSIS — M9906 Segmental and somatic dysfunction of lower extremity: Secondary | ICD-10-CM | POA: Diagnosis not present

## 2023-02-08 DIAGNOSIS — M5030 Other cervical disc degeneration, unspecified cervical region: Secondary | ICD-10-CM | POA: Diagnosis not present

## 2023-02-08 DIAGNOSIS — M2351 Chronic instability of knee, right knee: Secondary | ICD-10-CM | POA: Diagnosis not present

## 2023-02-08 DIAGNOSIS — M9905 Segmental and somatic dysfunction of pelvic region: Secondary | ICD-10-CM | POA: Diagnosis not present

## 2023-02-08 DIAGNOSIS — M62838 Other muscle spasm: Secondary | ICD-10-CM | POA: Diagnosis not present

## 2023-02-08 DIAGNOSIS — M791 Myalgia, unspecified site: Secondary | ICD-10-CM | POA: Diagnosis not present

## 2023-02-08 DIAGNOSIS — M25562 Pain in left knee: Secondary | ICD-10-CM | POA: Diagnosis not present

## 2023-02-08 DIAGNOSIS — M25561 Pain in right knee: Secondary | ICD-10-CM | POA: Diagnosis not present

## 2023-02-08 DIAGNOSIS — M9901 Segmental and somatic dysfunction of cervical region: Secondary | ICD-10-CM | POA: Diagnosis not present

## 2023-02-08 DIAGNOSIS — M9902 Segmental and somatic dysfunction of thoracic region: Secondary | ICD-10-CM | POA: Diagnosis not present

## 2023-02-08 DIAGNOSIS — G4486 Cervicogenic headache: Secondary | ICD-10-CM | POA: Diagnosis not present

## 2023-02-08 DIAGNOSIS — M542 Cervicalgia: Secondary | ICD-10-CM | POA: Diagnosis not present

## 2023-02-08 DIAGNOSIS — M9903 Segmental and somatic dysfunction of lumbar region: Secondary | ICD-10-CM | POA: Diagnosis not present

## 2023-02-14 ENCOUNTER — Telehealth (HOSPITAL_COMMUNITY): Payer: Self-pay

## 2023-02-14 DIAGNOSIS — G4709 Other insomnia: Secondary | ICD-10-CM

## 2023-02-14 MED ORDER — TRAZODONE HCL 100 MG PO TABS
ORAL_TABLET | ORAL | 0 refills | Status: DC
Start: 2023-02-14 — End: 2023-03-07

## 2023-02-14 NOTE — Telephone Encounter (Signed)
Pt called in requesting a refill on her  traZODone (DESYREL) 100 MG tablet sent to [facility]. Pt is scheduled with Dr Adrian Blackwater 04/07/23. Last filled 10/28/22 135 Tablets. Please advise.

## 2023-02-14 NOTE — Telephone Encounter (Signed)
Called pt to let her know medication has been sent in no answer lvm to check pharmacy

## 2023-02-16 DIAGNOSIS — M9902 Segmental and somatic dysfunction of thoracic region: Secondary | ICD-10-CM | POA: Diagnosis not present

## 2023-02-16 DIAGNOSIS — M25561 Pain in right knee: Secondary | ICD-10-CM | POA: Diagnosis not present

## 2023-02-16 DIAGNOSIS — M5412 Radiculopathy, cervical region: Secondary | ICD-10-CM | POA: Diagnosis not present

## 2023-02-16 DIAGNOSIS — M9903 Segmental and somatic dysfunction of lumbar region: Secondary | ICD-10-CM | POA: Diagnosis not present

## 2023-02-16 DIAGNOSIS — M5417 Radiculopathy, lumbosacral region: Secondary | ICD-10-CM | POA: Diagnosis not present

## 2023-02-16 DIAGNOSIS — M542 Cervicalgia: Secondary | ICD-10-CM | POA: Diagnosis not present

## 2023-02-16 DIAGNOSIS — M1711 Unilateral primary osteoarthritis, right knee: Secondary | ICD-10-CM | POA: Diagnosis not present

## 2023-02-16 DIAGNOSIS — M5030 Other cervical disc degeneration, unspecified cervical region: Secondary | ICD-10-CM | POA: Diagnosis not present

## 2023-02-16 DIAGNOSIS — G4486 Cervicogenic headache: Secondary | ICD-10-CM | POA: Diagnosis not present

## 2023-02-16 DIAGNOSIS — M2351 Chronic instability of knee, right knee: Secondary | ICD-10-CM | POA: Diagnosis not present

## 2023-02-16 DIAGNOSIS — M5416 Radiculopathy, lumbar region: Secondary | ICD-10-CM | POA: Diagnosis not present

## 2023-02-16 DIAGNOSIS — M9906 Segmental and somatic dysfunction of lower extremity: Secondary | ICD-10-CM | POA: Diagnosis not present

## 2023-02-16 DIAGNOSIS — M62838 Other muscle spasm: Secondary | ICD-10-CM | POA: Diagnosis not present

## 2023-02-16 DIAGNOSIS — M25562 Pain in left knee: Secondary | ICD-10-CM | POA: Diagnosis not present

## 2023-02-16 DIAGNOSIS — M9905 Segmental and somatic dysfunction of pelvic region: Secondary | ICD-10-CM | POA: Diagnosis not present

## 2023-02-16 DIAGNOSIS — M791 Myalgia, unspecified site: Secondary | ICD-10-CM | POA: Diagnosis not present

## 2023-02-16 DIAGNOSIS — M9901 Segmental and somatic dysfunction of cervical region: Secondary | ICD-10-CM | POA: Diagnosis not present

## 2023-02-22 DIAGNOSIS — M62838 Other muscle spasm: Secondary | ICD-10-CM | POA: Diagnosis not present

## 2023-02-22 DIAGNOSIS — M5417 Radiculopathy, lumbosacral region: Secondary | ICD-10-CM | POA: Diagnosis not present

## 2023-02-22 DIAGNOSIS — M5030 Other cervical disc degeneration, unspecified cervical region: Secondary | ICD-10-CM | POA: Diagnosis not present

## 2023-02-22 DIAGNOSIS — G4486 Cervicogenic headache: Secondary | ICD-10-CM | POA: Diagnosis not present

## 2023-02-22 DIAGNOSIS — M5416 Radiculopathy, lumbar region: Secondary | ICD-10-CM | POA: Diagnosis not present

## 2023-02-22 DIAGNOSIS — M5412 Radiculopathy, cervical region: Secondary | ICD-10-CM | POA: Diagnosis not present

## 2023-02-22 DIAGNOSIS — M9901 Segmental and somatic dysfunction of cervical region: Secondary | ICD-10-CM | POA: Diagnosis not present

## 2023-02-22 DIAGNOSIS — M25562 Pain in left knee: Secondary | ICD-10-CM | POA: Diagnosis not present

## 2023-02-22 DIAGNOSIS — M542 Cervicalgia: Secondary | ICD-10-CM | POA: Diagnosis not present

## 2023-02-22 DIAGNOSIS — M2351 Chronic instability of knee, right knee: Secondary | ICD-10-CM | POA: Diagnosis not present

## 2023-02-22 DIAGNOSIS — M9906 Segmental and somatic dysfunction of lower extremity: Secondary | ICD-10-CM | POA: Diagnosis not present

## 2023-02-22 DIAGNOSIS — M9903 Segmental and somatic dysfunction of lumbar region: Secondary | ICD-10-CM | POA: Diagnosis not present

## 2023-02-22 DIAGNOSIS — M9902 Segmental and somatic dysfunction of thoracic region: Secondary | ICD-10-CM | POA: Diagnosis not present

## 2023-02-22 DIAGNOSIS — M791 Myalgia, unspecified site: Secondary | ICD-10-CM | POA: Diagnosis not present

## 2023-02-22 DIAGNOSIS — M25561 Pain in right knee: Secondary | ICD-10-CM | POA: Diagnosis not present

## 2023-02-22 DIAGNOSIS — M9905 Segmental and somatic dysfunction of pelvic region: Secondary | ICD-10-CM | POA: Diagnosis not present

## 2023-03-01 ENCOUNTER — Ambulatory Visit (HOSPITAL_BASED_OUTPATIENT_CLINIC_OR_DEPARTMENT_OTHER): Payer: Medicare HMO | Admitting: Internal Medicine

## 2023-03-01 ENCOUNTER — Telehealth: Payer: Self-pay | Admitting: Internal Medicine

## 2023-03-01 ENCOUNTER — Encounter (HOSPITAL_BASED_OUTPATIENT_CLINIC_OR_DEPARTMENT_OTHER): Payer: Self-pay | Admitting: Internal Medicine

## 2023-03-01 VITALS — BP 116/82 | HR 53 | Ht 61.0 in | Wt 150.6 lb

## 2023-03-01 DIAGNOSIS — E785 Hyperlipidemia, unspecified: Secondary | ICD-10-CM

## 2023-03-01 DIAGNOSIS — E7841 Elevated Lipoprotein(a): Secondary | ICD-10-CM

## 2023-03-01 DIAGNOSIS — I2510 Atherosclerotic heart disease of native coronary artery without angina pectoris: Secondary | ICD-10-CM | POA: Diagnosis not present

## 2023-03-01 DIAGNOSIS — I700 Atherosclerosis of aorta: Secondary | ICD-10-CM | POA: Diagnosis not present

## 2023-03-01 NOTE — Patient Instructions (Signed)
Medication Instructions:  Your physician recommends that you continue on your current medications as directed. Please refer to the Current Medication list given to you today.  *If you need a refill on your cardiac medications before your next appointment, please call your pharmacy*   Follow-Up: At [facility], you and your health needs are our priority.  As part of our continuing mission to provide you with exceptional heart care, we have created designated Provider Care Teams.  These Care Teams include your primary Cardiologist (physician) and Advanced Practice Providers (APPs -  Physician Assistants and Nurse Practitioners) who all work together to provide you with the care you need, when you need it.  We recommend signing up for the patient portal called "MyChart".  Sign up information is provided on this After Visit Summary.  MyChart is used to connect with patients for Virtual Visits (Telemedicine).  Patients are able to view lab/test results, encounter notes, upcoming appointments, etc.  Non-urgent messages can be sent to your provider as well.   To learn more about what you can do with MyChart, go to [email or web address].    Your next appointment:   Follow up as needed

## 2023-03-01 NOTE — Progress Notes (Signed)
LIPID CLINIC CONSULT NOTE  Chief Complaint:  Elevated LP(a)  Primary Care Physician: Tannenbaum, [place], MD (Inactive)  Primary Cardiologist:  Orbie Pyo, MD  HPI:  Victoria Deleon is a 57 y.o. female who is being seen today for the evaluation of elevated LP(a) at the request of Thukkani, Charlies Constable, MD. this is a pleasant 57 year old female kindly referred for elevated LP(a).  She had recently seen my partner last year for chest discomfort.  She underwent coronary CT angiography in May 2023 which demonstrated no obstructive coronary disease with minimal plaque in the proximal LAD, calcium score was 11, 81st percentile for age and sex matched controls.  She had a significantly elevated lipid profile with an LDL of 251.  She started on therapies with statins and was on 80 mg atorvastatin.  Her most recent lipid profile showed marked improvement with total cholesterol 152, triglycerides 118, HDL 55 and LDL 76.  However, subsequently she has been working with a provider in [place] who has stopped all of her medications in order to "wash out of her system".  The plan that is to reestablish some medications as needed based on repeat testing.  She says she feels dramatically better after coming off of multiple medications.  Her cardiologist did assess for LP(a), however and this was significantly elevated at 368.7 nmol/L.  We discussed how this may not be associated with diet or lifestyle and is likely not affected by statin therapy.  Currently she is on no lipid-lowering therapies.  She plans to have repeat lab work through this provider within the next week.  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 Jena Gauss: Normal terminal ileum with negative biopsies, normal-appearing colon with random colon biopsies negative, internal hemorrhoids.  Next colonoscopy 10 years.   ESOPHAGOGASTRODUODENOSCOPY (EGD) WITH PROPOFOL N/A 04/24/2018   Dr. Jena Gauss: Severe ulcerative reflux esophagitis, small hiatal hernia, small bowel biopsies negative for celiac.   EYE SURGERY N/A    [employer]reesia 03/04/2020   JOINT REPLACEMENT N/A    [employer]reesia 01/22/2020   MEDIAL PARTIAL KNEE REPLACEMENT Left    TOTAL KNEE REVISION Left 09/10/2019   Procedure: LEFT TOTAL KNEE REVISION;  Surgeon: Gean Birchwood, MD;  Location: [facility];  Service: Orthopedics;  Laterality: Left;    FAMHx:  Family History  Adopted: Yes  Problem Relation Age of Onset   Colon cancer Neg Hx     SOCHx:   reports that she has been smoking cigarettes. She has a 7.5 pack-year smoking history. She has never used smokeless tobacco.  She reports current alcohol use. She reports that she does not currently use drugs after having used the following drugs: Marijuana.  ALLERGIES:  Allergies  Allergen Reactions   Codeine Anaphylaxis    ROS: Pertinent items noted in HPI and remainder of comprehensive ROS otherwise negative.  HOME MEDS: Current Outpatient Medications on File Prior to Visit  Medication Sig Dispense Refill   EPINEPHrine 0.3 mg/0.3 mL IJ SOAJ injection Inject 0.3 mg into the muscle as needed for anaphylaxis. 1 each 2    FIBER PO Take 1 capsule by mouth 2 (two) times daily.      omeprazole (PRILOSEC) 20 MG capsule Take 20 mg by mouth daily.     traMADol (ULTRAM) 50 MG tablet Take 50 mg by mouth every 6 (six) hours as needed.     albuterol (VENTOLIN HFA) 108 (90 Base) MCG/ACT inhaler Inhale 1 puff into the lungs every 6 (six) hours as needed for shortness of breath. (Patient not taking: Reported on 03/01/2023)     atorvastatin (LIPITOR) 80 MG tablet Take 1 tablet (80 mg total) by mouth at bedtime. (Patient not taking: Reported on 03/01/2023) 90 tablet 3   benzonatate (TESSALON) 200 MG capsule Take 200 mg by mouth 2 (two) times daily as needed for cough. (Patient not taking: Reported on 03/01/2023)     bisacodyl (DULCOLAX) 10 MG suppository Place 1 suppository (10 mg total) rectally as needed for moderate constipation. (Patient not taking: Reported on 03/01/2023) 12 suppository 0   DULoxetine (CYMBALTA) 60 MG capsule Take 2 capsules (120 mg total) by mouth daily. (Patient not taking: Reported on 03/01/2023) 60 capsule 3   levothyroxine (SYNTHROID) 175 MCG tablet Take 1 tablet (175 mcg total) by mouth daily. (Patient not taking: Reported on 03/01/2023) 90 tablet 3   linaclotide (LINZESS) 290 MCG CAPS capsule TAKE 1 CAPSULE(290 MCG) BY MOUTH DAILY BEFORE BREAKFAST (Patient not taking: Reported on 03/01/2023) 90 capsule 3   liothyronine (CYTOMEL) 5 MCG tablet Take 5 mcg by mouth daily. (Patient not taking: Reported on 03/01/2023)     losartan (COZAAR) 50 MG tablet TAKE 1 TABLET BY MOUTH ONCE DAY. (Patient not taking: Reported on 03/01/2023) 90 tablet 0   meloxicam (MOBIC) 15 MG tablet Take 15 mg by mouth daily. (Patient not taking: Reported on 03/01/2023)     nitroGLYCERIN (NITROSTAT) 0.4 MG SL tablet Place 1 tablet under the tongue as needed. (Patient not taking: Reported on 03/01/2023)     pregabalin (LYRICA) 150 MG capsule Take 150 mg by mouth 2 (two) times daily. (Patient not taking: Reported on 03/01/2023)     propranolol  (INDERAL) 20 MG tablet Take 20 mg by mouth 3 (three) times daily as needed for anxiety. (Patient not taking: Reported on 03/01/2023)     traZODone (DESYREL) 100 MG tablet Take a full tablet nightly before bed.  You can take half a tablet as needed during the night. (Patient not taking: Reported on 03/01/2023) 135 tablet 0   No current facility-administered medications on file prior to visit.    LABS/IMAGING: No results found for this or any previous visit (from the past 48 hour(s)). No results found.  LIPID PANEL:    Component Value Date/Time   CHOL 152 04/07/2022 1027   TRIG 118 04/07/2022 1027   HDL 55 04/07/2022 1027   CHOLHDL 2.8 04/07/2022 1027   LDLCALC 76 04/07/2022 1027    WEIGHTS: Wt Readings from Last 3 Encounters:  03/01/23 150 lb 9.6 oz (68.3 kg)  04/07/22 165 lb  9.6 oz (75.1 kg)  09/23/21 175 lb (79.4 kg)    VITALS: BP 116/82 (BP Location: Left Arm, Patient Position: Sitting, Cuff Size: Normal)   Pulse (!) 53   Ht 5\' 1"  (1.549 m)   Wt 150 lb 9.6 oz (68.3 kg)   SpO2 96%   BMI 28.46 kg/m   EXAM: Deferred  EKG: Deferred  ASSESSMENT: Mixed dyslipidemia, goal LDL less than 70 Elevated OV(F)-643.3 nmol/L CAC score of 11, 81st percentile, mild nonobstructive CAD (09/2021)  PLAN: 1.   Ms. Dunbar has a mixed dyslipidemia and was well treated on 80 mg of atorvastatin with an LDL of 76 however all of her medications have been stopped by a provider to allow washout of her system with an apparent plan to restart medications as needed.  She understands that the statin is important for lowering cardiovascular risk even if it does not affect her LP(a).  I would recommend restarting her atorvastatin at dosing depending on her lipid levels.  In addition she would likely benefit from specific LP(a) lowering therapy.  This could consist of possibly PCSK9 inhibitor or she may be a good candidate for an upcoming clinical trial for primary prevention of LP(a) lowering with a  specific inhibitor.  She did express some interest in that.  I will reach out to her once this trial is available for enrollment which I hope would be the next few weeks.  She will contact me with the blood work from her provider.  Plan follow-up as needed based on the direction her care goes to regarding research or available therapies.  Thanks again for the kind referral.  Chrystie Nose, MD, [facility]  [place]  [facility]  Medical Director of the [facility] Lipid Disorders &  Cardiovascular Risk Reduction Clinic Diplomate of the American Board of Clinical Lipidology Attending Cardiologist  Direct Dial: [phone]  Fax: [phone]  Website:  [email or web address]  Lisette Abu Lacrisha Bielicki 03/01/2023, 10:19 AM

## 2023-03-01 NOTE — Telephone Encounter (Signed)
Discussed with Dr Rennis Golden, patient was to just have next lipids/labs done at PCP sent to him No additional labs needed from him at this point Left detailed message, ok per [facility]

## 2023-03-01 NOTE — Telephone Encounter (Signed)
Patient is calling because Dr. Rennis Golden requested the patient complete labs at her PCP. Patient spoke with her PCP and they informed her Dr. Rennis Golden would need to order the labs. Patient is requesting for [place] to send the lab orders to the [facility] in [place]. Patient is requesting we call her once the order have been placed. Please advise.

## 2023-03-02 DIAGNOSIS — M5416 Radiculopathy, lumbar region: Secondary | ICD-10-CM | POA: Diagnosis not present

## 2023-03-02 DIAGNOSIS — M25561 Pain in right knee: Secondary | ICD-10-CM | POA: Diagnosis not present

## 2023-03-02 DIAGNOSIS — M542 Cervicalgia: Secondary | ICD-10-CM | POA: Diagnosis not present

## 2023-03-02 DIAGNOSIS — M5417 Radiculopathy, lumbosacral region: Secondary | ICD-10-CM | POA: Diagnosis not present

## 2023-03-02 DIAGNOSIS — M62838 Other muscle spasm: Secondary | ICD-10-CM | POA: Diagnosis not present

## 2023-03-02 DIAGNOSIS — M791 Myalgia, unspecified site: Secondary | ICD-10-CM | POA: Diagnosis not present

## 2023-03-02 DIAGNOSIS — G4486 Cervicogenic headache: Secondary | ICD-10-CM | POA: Diagnosis not present

## 2023-03-02 DIAGNOSIS — M9902 Segmental and somatic dysfunction of thoracic region: Secondary | ICD-10-CM | POA: Diagnosis not present

## 2023-03-02 DIAGNOSIS — M9905 Segmental and somatic dysfunction of pelvic region: Secondary | ICD-10-CM | POA: Diagnosis not present

## 2023-03-02 DIAGNOSIS — M9901 Segmental and somatic dysfunction of cervical region: Secondary | ICD-10-CM | POA: Diagnosis not present

## 2023-03-02 DIAGNOSIS — M5030 Other cervical disc degeneration, unspecified cervical region: Secondary | ICD-10-CM | POA: Diagnosis not present

## 2023-03-02 DIAGNOSIS — M9906 Segmental and somatic dysfunction of lower extremity: Secondary | ICD-10-CM | POA: Diagnosis not present

## 2023-03-02 DIAGNOSIS — M5412 Radiculopathy, cervical region: Secondary | ICD-10-CM | POA: Diagnosis not present

## 2023-03-02 DIAGNOSIS — M9903 Segmental and somatic dysfunction of lumbar region: Secondary | ICD-10-CM | POA: Diagnosis not present

## 2023-03-03 DIAGNOSIS — E039 Hypothyroidism, unspecified: Secondary | ICD-10-CM | POA: Diagnosis not present

## 2023-03-07 ENCOUNTER — Encounter (HOSPITAL_COMMUNITY): Payer: Self-pay | Admitting: Psychiatry

## 2023-03-07 ENCOUNTER — Telehealth (INDEPENDENT_AMBULATORY_CARE_PROVIDER_SITE_OTHER): Payer: Medicare HMO | Admitting: Psychiatry

## 2023-03-07 DIAGNOSIS — F4310 Post-traumatic stress disorder, unspecified: Secondary | ICD-10-CM

## 2023-03-07 DIAGNOSIS — G4709 Other insomnia: Secondary | ICD-10-CM | POA: Diagnosis not present

## 2023-03-07 DIAGNOSIS — F410 Panic disorder [episodic paroxysmal anxiety] without agoraphobia: Secondary | ICD-10-CM | POA: Diagnosis not present

## 2023-03-07 DIAGNOSIS — F3341 Major depressive disorder, recurrent, in partial remission: Secondary | ICD-10-CM | POA: Diagnosis not present

## 2023-03-07 DIAGNOSIS — F411 Generalized anxiety disorder: Secondary | ICD-10-CM

## 2023-03-07 DIAGNOSIS — F4000 Agoraphobia, unspecified: Secondary | ICD-10-CM

## 2023-03-07 DIAGNOSIS — F17200 Nicotine dependence, unspecified, uncomplicated: Secondary | ICD-10-CM

## 2023-03-07 DIAGNOSIS — F17210 Nicotine dependence, cigarettes, uncomplicated: Secondary | ICD-10-CM

## 2023-03-07 MED ORDER — DULOXETINE HCL 60 MG PO CPEP
120.0000 mg | ORAL_CAPSULE | Freq: Every day | ORAL | 3 refills | Status: DC
Start: 2023-03-07 — End: 2023-08-15

## 2023-03-07 MED ORDER — TRAZODONE HCL 100 MG PO TABS
ORAL_TABLET | ORAL | 0 refills | Status: DC
Start: 2023-03-07 — End: 2023-08-29
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
## 2023-03-07 NOTE — Patient Instructions (Signed)
We did not make any medication changes today.  Keep up the good work with taking care of your physical and mental health.

## 2023-03-10 NOTE — Telephone Encounter (Signed)
Victoria Nose, MD  Lindell Spar, RN  She needs updated lipids - was going to get them at PCP, but they are saying we need to order it. Would get a lipid NMR - if her LDL is at goal, then probably would pursue clinical trial for LP(a) - we are not enrolling yet, but she is on the call list.

## 2023-03-10 NOTE — Addendum Note (Signed)
Addended by: Lindell Spar on: 03/10/2023 10:44 AM   Modules accepted: Orders

## 2023-03-10 NOTE — Telephone Encounter (Signed)
Spoke with patient. Notified her that NMR lipoprofile is needed per MD before determining if she can do the LPa trial. She'll get this done on Monday at [facility] in [place]

## 2023-03-10 NOTE — Telephone Encounter (Signed)
Dr. Rennis Golden,   Your 03/01/23 note mentions a possible clinical trial.   Please advise

## 2023-03-10 NOTE — Telephone Encounter (Signed)
Returned call to patient,   Patient states she was told someone was going to reach out to her about a special shot for her cholesterol but hasn't heard from anyone.

## 2023-03-10 NOTE — Telephone Encounter (Signed)
Patient is returning call.  °

## 2023-03-15 DIAGNOSIS — E785 Hyperlipidemia, unspecified: Secondary | ICD-10-CM | POA: Diagnosis not present

## 2023-03-17 LAB — NMR, LIPOPROFILE
Cholesterol, Total: 318 mg/dL — ABNORMAL HIGH (ref 100–199)
HDL Particle Number: 31.7 umol/L (ref >=30.5)
HDL-C: 54 mg/dL (ref >39)
LDL Particle Number: 2599 nmol/L — ABNORMAL HIGH (ref <1000)
LDL Size: 21.7 nmol (ref >20.5)
LDL-C (NIH Calc): 237 mg/dL — ABNORMAL HIGH (ref 0–99)
LP-IR Score: 30 (ref <=45)
Small LDL Particle Number: 794 nmol/L — ABNORMAL HIGH (ref <=527)
Triglycerides: 145 mg/dL (ref 0–149)

## 2023-03-23 DIAGNOSIS — E785 Hyperlipidemia, unspecified: Secondary | ICD-10-CM

## 2023-03-25 ENCOUNTER — Other Ambulatory Visit: Payer: Self-pay

## 2023-03-25 DIAGNOSIS — E785 Hyperlipidemia, unspecified: Secondary | ICD-10-CM

## 2023-03-25 MED ORDER — ATORVASTATIN CALCIUM 40 MG PO TABS: 40.0000 mg | ORAL_TABLET | Freq: Every day | ORAL | 3 refills | Status: DC

## 2023-04-18 ENCOUNTER — Encounter: Payer: Self-pay | Admitting: Internal Medicine

## 2023-04-18 ENCOUNTER — Ambulatory Visit: Payer: Medicare HMO | Admitting: Internal Medicine

## 2023-04-18 VITALS — BP 122/82 | HR 65 | Ht 61.0 in | Wt 147.0 lb

## 2023-04-18 DIAGNOSIS — F3341 Major depressive disorder, recurrent, in partial remission: Secondary | ICD-10-CM

## 2023-04-18 DIAGNOSIS — Z114 Encounter for screening for human immunodeficiency virus [HIV]: Secondary | ICD-10-CM | POA: Diagnosis not present

## 2023-04-18 DIAGNOSIS — R7303 Prediabetes: Secondary | ICD-10-CM | POA: Diagnosis not present

## 2023-04-18 DIAGNOSIS — E559 Vitamin D deficiency, unspecified: Secondary | ICD-10-CM

## 2023-04-18 DIAGNOSIS — I10 Essential (primary) hypertension: Secondary | ICD-10-CM

## 2023-04-18 DIAGNOSIS — K219 Gastro-esophageal reflux disease without esophagitis: Secondary | ICD-10-CM

## 2023-04-18 DIAGNOSIS — F4000 Agoraphobia, unspecified: Secondary | ICD-10-CM | POA: Diagnosis not present

## 2023-04-18 DIAGNOSIS — Z1159 Encounter for screening for other viral diseases: Secondary | ICD-10-CM

## 2023-04-18 DIAGNOSIS — E063 Autoimmune thyroiditis: Secondary | ICD-10-CM | POA: Diagnosis not present

## 2023-04-18 DIAGNOSIS — M47816 Spondylosis without myelopathy or radiculopathy, lumbar region: Secondary | ICD-10-CM

## 2023-04-18 DIAGNOSIS — Z2821 Immunization not carried out because of patient refusal: Secondary | ICD-10-CM

## 2023-04-18 DIAGNOSIS — F17200 Nicotine dependence, unspecified, uncomplicated: Secondary | ICD-10-CM

## 2023-04-18 DIAGNOSIS — E039 Hypothyroidism, unspecified: Secondary | ICD-10-CM | POA: Diagnosis not present

## 2023-04-18 DIAGNOSIS — Z1231 Encounter for screening mammogram for malignant neoplasm of breast: Secondary | ICD-10-CM | POA: Diagnosis not present

## 2023-04-18 DIAGNOSIS — E785 Hyperlipidemia, unspecified: Secondary | ICD-10-CM

## 2023-04-18 NOTE — Assessment & Plan Note (Signed)
A1c 6.0 on labs from 2022.  Repeat A1c ordered today.

## 2023-04-18 NOTE — Patient Instructions (Signed)
It was a pleasure to see you today.  Thank you for giving [place] the opportunity to be involved in your care.  Below is a brief recap of your visit and next steps.  We will plan to see you again in 3 months.  Summary You have re-established care today We will update labs Mammogram ordered Follow up in 3 months

## 2023-04-18 NOTE — Assessment & Plan Note (Signed)
Previously documented history of essential hypertension.  She is not currently on any antihypertensive therapy and BP today is 122/82.  No indication to start treatment.

## 2023-04-18 NOTE — Assessment & Plan Note (Signed)
Symptoms are adequately controlled with omeprazole 20 mg daily and avoidance of known triggers.

## 2023-04-18 NOTE — Assessment & Plan Note (Signed)
Chronic musculoskeletal pain secondary to lumbar spondylosis and multiple prior knee surgeries.  She is currently prescribed duloxetine and sees a chiropractor regularly.

## 2023-04-18 NOTE — Assessment & Plan Note (Signed)
She currently smokes 6 cigarettes/day and is precontemplative with regards to cessation. -The patient was counseled on the dangers of tobacco use, and was advised to quit and reluctant to quit.  Reviewed strategies to maximize success, including removing cigarettes and smoking materials from environment, stress management, substitution of other forms of reinforcement, support of family/friends, and written materials.

## 2023-04-18 NOTE — Assessment & Plan Note (Signed)
Recently prescribed atorvastatin 40 mg daily.  She is followed by Dr. Rennis Golden with the advanced lipid clinic.

## 2023-04-18 NOTE — Progress Notes (Signed)
New Patient Office Visit  Subjective    Patient ID: Victoria Deleon, female    DOB: Apr 25, 1966  Age: 57 y.o. MRN: [id number]  CC:  Chief Complaint  Patient presents with   New Patient (Initial Visit)    Establish care, sinus issues    HPI Victoria Deleon presents to establish care.  She is a 57 year old woman who endorses a past medical history significant for hypothyroidism, HLD, HTN, GERD, anxiety/PTSD, insomnia, HTN, prediabetes, and chronic musculoskeletal pain.  Previously followed by [facility] physicians.  Victoria Deleon feeling fairly well today.  She endorses sinus congestion but is otherwise asymptomatic and has no acute concerns to discuss aside from desiring to establish care.  She endorses current tobacco use, smoking 6 cigarettes/day and is precontemplative with regards to cessation.  Denies alcohol and illicit drug use.  She is unaware of any feeling medical history as she was adopted.  Chronic medical conditions and outstanding preventative care items discussed today are individually addressed in A/P below.  Outpatient Encounter Medications as of 04/18/2023  Medication Sig   atorvastatin (LIPITOR) 40 MG tablet Take 1 tablet (40 mg total) by mouth daily.   DULoxetine (CYMBALTA) 60 MG capsule Take 2 capsules (120 mg total) by mouth daily.   EPINEPHrine 0.3 mg/0.3 mL IJ SOAJ injection Inject 0.3 mg into the muscle as needed for anaphylaxis.   FIBER PO Take 1 capsule by mouth 2 (two) times daily.    levothyroxine (SYNTHROID) 125 MCG tablet 1 tablet on an empty stomach Orally Once a day for 90 days   omeprazole (PRILOSEC) 20 MG capsule Take 20 mg by mouth daily.   traZODone (DESYREL) 100 MG tablet Take a full tablet nightly before bed.  You can take half a tablet as needed during the night.   No facility-administered encounter medications on file as of 04/18/2023.    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 Jena Gauss: Normal terminal ileum with negative biopsies, normal-appearing colon with random colon biopsies negative, internal hemorrhoids.  Next colonoscopy 10 years.   ESOPHAGOGASTRODUODENOSCOPY (EGD) WITH PROPOFOL N/A 04/24/2018   Dr. Jena Gauss: Severe ulcerative reflux esophagitis, small hiatal hernia, small bowel biopsies negative for celiac.   EYE SURGERY N/A    Phreesia 03/04/2020   JOINT REPLACEMENT N/A    Phreesia 01/22/2020   MEDIAL PARTIAL KNEE REPLACEMENT Left    TOTAL KNEE REVISION Left 09/10/2019   Procedure: LEFT TOTAL KNEE REVISION;  Surgeon: Gean Birchwood, MD;  Location: [facility];  Service: Orthopedics;  Laterality: Left;  Family History  Adopted: Yes  Problem Relation Age of Onset   Colon cancer Neg Hx     Social History   Socioeconomic History   Marital status: Married    Spouse name: Victoria Deleon    Number of children: Not on file   Years of education: Not on file   Highest education level: Not on file  Occupational History    Comment: medical disability   Tobacco Use   Smoking status: Every Day    Current packs/day: 0.25    Average packs/day: 0.3 packs/day for 30.0 years (7.5 ttl pk-yrs)     Types: Cigarettes   Smokeless tobacco: Never  Vaping Use   Vaping status: Never Used  Substance and Sexual Activity   Alcohol use: Yes    Comment: rare-maybe 2 glasses of wine/year   Drug use: Not Currently    Types: Marijuana    Comment: smoked marijuana starting at age 7 in [place]. Stopped use in 2020 after moving to    Sexual activity: Not Currently    Birth control/protection: Surgical    Comment: hyst  Other Topics Concern   Not on file  Social History Narrative   Lives with husband Victoria Deleon          Enjoys: kayaking, outside events       Diet: eats all food groups outside meat    Caffeine: tea 2 cups daily    Water: 5-6 cups daily or more      Wears seat belt   Does not use phone while driving    [employer] features editor safe area   Social Determinants of Health   Financial Resource Strain: High Risk (10/15/2020)   Overall Financial Resource Strain (CARDIA)    Difficulty of Paying Living Expenses: Hard  Food Insecurity: Food Insecurity Present (10/15/2020)   Hunger Vital Sign    Worried About Running Out of Food in the Last Year: Sometimes true    Ran Out of Food in the Last Year: Sometimes true  Transportation Needs: No Transportation Needs (10/15/2020)   PRAPARE - Administrator, Civil Service (Medical): No    Lack of Transportation (Non-Medical): No  Physical Activity: Inactive (10/15/2020)   Exercise Vital Sign    Days of Exercise per Week: 0 days    Minutes of Exercise per Session: 0 min  Stress: Stress Concern Present (10/15/2020)   [employer] of Occupational Health - Occupational Stress Questionnaire    Feeling of Stress : To some extent  Social Connections: Moderately Isolated (10/15/2020)   Social Connection and Isolation Panel [NHANES]    Frequency of Communication with Friends and Family: Once a week    Frequency of Social Gatherings with Friends and Family: More than three times a week    Attends Religious  Services: Never    Database administrator or Organizations: No    Attends Banker Meetings: Never    Marital Status: Married  Catering manager Violence: Not At Risk (10/15/2020)   Humiliation, Afraid, Rape, and Kick questionnaire    Fear of Current or Ex-Partner: No    Emotionally Abused: No    Physically Abused: No    Sexually Abused: No   Review of Systems  Constitutional:  Negative for chills and fever.  HENT:  Positive for congestion. Negative for sore throat.   Respiratory:  Negative for cough and shortness of breath.   Cardiovascular:  Negative for chest pain, palpitations  and leg swelling.  Gastrointestinal:  Negative for abdominal pain, blood in stool, constipation, diarrhea, nausea and vomiting.  Genitourinary:  Negative for dysuria and hematuria.  Musculoskeletal:  Negative for myalgias.  Skin:  Negative for itching and rash.  Neurological:  Negative for dizziness and headaches.  Psychiatric/Behavioral:  Negative for depression and suicidal ideas.    Objective    BP 122/82 (BP Location: Right Arm, Patient Position: Sitting, Cuff Size: Large)   Pulse 65   Ht 5\' 1"  (1.549 m)   Wt 147 lb (66.7 kg)   SpO2 92%   BMI 27.78 kg/m   Physical Exam Vitals reviewed.  Constitutional:      General: She is not in acute distress.    Appearance: Normal appearance. She is not toxic-appearing.  HENT:     Head: Normocephalic and atraumatic.     Right Ear: External ear normal.     Left Ear: External ear normal.     Nose: Congestion present. No rhinorrhea.     Mouth/Throat:     Mouth: Mucous membranes are moist.     Pharynx: Oropharynx is clear. No oropharyngeal exudate or posterior oropharyngeal erythema.  Eyes:     General: No scleral icterus.    Extraocular Movements: Extraocular movements intact.     Conjunctiva/sclera: Conjunctivae normal.     Pupils: Pupils are equal, round, and reactive to light.  Cardiovascular:     Rate and Rhythm: Normal rate and regular  rhythm.     Pulses: Normal pulses.     Heart sounds: Normal heart sounds. No murmur heard.    No friction rub. No gallop.  Pulmonary:     Effort: Pulmonary effort is normal.     Breath sounds: Normal breath sounds. No wheezing, rhonchi or rales.  Abdominal:     General: Abdomen is flat. Bowel sounds are normal. There is no distension.     Palpations: Abdomen is soft.     Tenderness: There is no abdominal tenderness.  Musculoskeletal:        General: No swelling. Normal range of motion.     Cervical back: Normal range of motion.     Right lower leg: No edema.     Left lower leg: No edema.  Lymphadenopathy:     Cervical: No cervical adenopathy.  Skin:    General: Skin is warm and dry.     Capillary Refill: Capillary refill takes less than 2 seconds.     Coloration: Skin is not jaundiced.  Neurological:     General: No focal deficit present.     Mental Status: She is alert and oriented to person, place, and time.  Psychiatric:        Mood and Affect: Mood normal.        Behavior: Behavior normal.    Assessment & Plan:   Problem List Items Addressed This Visit       Essential hypertension - Primary    Previously documented history of essential hypertension.  She is not currently on any antihypertensive therapy and BP today is 122/82.  No indication to start treatment.      GERD (gastroesophageal reflux disease)    Symptoms are adequately controlled with omeprazole 20 mg daily and avoidance of known triggers.      Hypothyroidism    Secondary to Hashimoto's thyroiditis.  She is currently prescribed levothyroxine 125 mcg daily.  Last TSH on file from 2022 was significantly elevated at 23.  She is asymptomatic currently. -Repeat TFTs ordered today  Lumbar spondylosis    Chronic musculoskeletal pain secondary to lumbar spondylosis and multiple prior knee surgeries.  She is currently prescribed duloxetine and sees a chiropractor regularly.      Tobacco use disorder  (Chronic)    She currently smokes 6 cigarettes/day and is precontemplative with regards to cessation. -The patient was counseled on the dangers of tobacco use, and was advised to quit and reluctant to quit.  Reviewed strategies to maximize success, including removing cigarettes and smoking materials from environment, stress management, substitution of other forms of reinforcement, support of family/friends, and written materials.       Screening mammogram, encounter for    Screening mammogram ordered today      Hyperlipidemia    Recently prescribed atorvastatin 40 mg daily.  She is followed by Dr. Rennis Golden [facility].        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 Adrian Blackwater).  Deleon that mood is stable and anxiety adequately controlled with Cymbalta 120 mg daily.  She also takes trazodone 100 mg nightly for management of insomnia.      Prediabetes    A1c 6.0 on labs from 2022.  Repeat A1c ordered today.      Return in about 3 months (around 07/19/2023).   Billie Lade, MD

## 2023-04-18 NOTE — Assessment & Plan Note (Signed)
Followed by psychiatry (Dr. Adrian Blackwater).  Reports that mood is stable and anxiety adequately controlled with Cymbalta 120 mg daily.  She also takes trazodone 100 mg nightly for management of insomnia.

## 2023-04-18 NOTE — Assessment & Plan Note (Signed)
Secondary to Hashimoto's thyroiditis.  She is currently prescribed levothyroxine 125 mcg daily.  Last TSH on file from 2022 was significantly elevated at 23.  She is asymptomatic currently. -Repeat TFTs ordered today

## 2023-04-18 NOTE — Assessment & Plan Note (Signed)
 Screening mammogram ordered today ?

## 2023-04-19 ENCOUNTER — Other Ambulatory Visit: Payer: Self-pay | Admitting: Internal Medicine

## 2023-04-19 DIAGNOSIS — E063 Autoimmune thyroiditis: Secondary | ICD-10-CM

## 2023-04-19 LAB — CBC WITH DIFFERENTIAL/PLATELET
Basophils Absolute: 0.1 10*3/uL (ref 0.0–0.2)
Basos: 1 %
EOS (ABSOLUTE): 0.2 10*3/uL (ref 0.0–0.4)
Eos: 3 %
Hematocrit: 44.7 % (ref 34.0–46.6)
Hemoglobin: 15 g/dL (ref 11.1–15.9)
Immature Grans (Abs): 0 10*3/uL (ref 0.0–0.1)
Immature Granulocytes: 0 %
Lymphocytes Absolute: 1.3 10*3/uL (ref 0.7–3.1)
Lymphs: 20 %
MCH: 31.3 pg (ref 26.6–33.0)
MCHC: 33.6 g/dL (ref 31.5–35.7)
MCV: 93 fL (ref 79–97)
Monocytes Absolute: 0.6 10*3/uL (ref 0.1–0.9)
Monocytes: 9 %
Neutrophils Absolute: 4.5 10*3/uL (ref 1.4–7.0)
Neutrophils: 67 %
Platelets: 337 10*3/uL (ref 150–450)
RBC: 4.8 x10E6/uL (ref 3.77–5.28)
RDW: 12.7 % (ref 11.7–15.4)
WBC: 6.7 10*3/uL (ref 3.4–10.8)

## 2023-04-19 LAB — B12 AND FOLATE PANEL
Folate: 14.7 ng/mL
Vitamin B-12: 877 pg/mL (ref 232–1245)

## 2023-04-19 LAB — TSH+FREE T4
Free T4: 1.48 ng/dL (ref 0.82–1.77)
TSH: 10.4 u[IU]/mL — ABNORMAL HIGH (ref 0.450–4.500)

## 2023-04-19 LAB — CMP14+EGFR
ALT: 11 [IU]/L (ref 0–32)
AST: 16 [IU]/L (ref 0–40)
Albumin: 4.6 g/dL (ref 3.8–4.9)
Alkaline Phosphatase: 106 [IU]/L (ref 44–121)
BUN/Creatinine Ratio: 12 (ref 9–23)
BUN: 9 mg/dL (ref 6–24)
Bilirubin Total: 0.5 mg/dL (ref 0.0–1.2)
CO2: 20 mmol/L (ref 20–29)
Calcium: 10 mg/dL (ref 8.7–10.2)
Chloride: 102 mmol/L (ref 96–106)
Creatinine, Ser: 0.76 mg/dL (ref 0.57–1.00)
Globulin, Total: 2.3 g/dL (ref 1.5–4.5)
Glucose: 112 mg/dL — ABNORMAL HIGH (ref 70–99)
Potassium: 4.4 mmol/L (ref 3.5–5.2)
Sodium: 139 mmol/L (ref 134–144)
Total Protein: 6.9 g/dL (ref 6.0–8.5)
eGFR: 91 mL/min/{1.73_m2} (ref >59)

## 2023-04-19 LAB — HCV AB W REFLEX TO QUANT PCR: HCV Ab: NONREACTIVE

## 2023-04-19 LAB — HEMOGLOBIN A1C
Est. average glucose Bld gHb Est-mCnc: 128 mg/dL
Hgb A1c MFr Bld: 6.1 % — ABNORMAL HIGH (ref 4.8–5.6)

## 2023-04-19 LAB — VITAMIN D 25 HYDROXY (VIT D DEFICIENCY, FRACTURES): Vit D, 25-Hydroxy: 49.8 ng/mL (ref 30.0–100.0)

## 2023-04-19 LAB — HCV INTERPRETATION

## 2023-04-19 LAB — HIV ANTIBODY (ROUTINE TESTING W REFLEX): HIV Screen 4th Generation wRfx: NONREACTIVE

## 2023-04-19 MED ORDER — LEVOTHYROXINE SODIUM 137 MCG PO TABS: 137.0000 ug | ORAL_TABLET | Freq: Every day | ORAL | 1 refills | Status: DC

## 2023-06-03 ENCOUNTER — Ambulatory Visit (HOSPITAL_COMMUNITY)
Admission: RE | Admit: 2023-06-03 | Discharge: 2023-06-03 | Disposition: A | Discharge status: Home / Self Care | Payer: Medicare HMO | Source: Ambulatory Visit | Attending: Internal Medicine | Admitting: Internal Medicine

## 2023-06-03 ENCOUNTER — Encounter (HOSPITAL_COMMUNITY): Payer: Self-pay

## 2023-06-03 DIAGNOSIS — Z1231 Encounter for screening mammogram for malignant neoplasm of breast: Secondary | ICD-10-CM | POA: Insufficient documentation

## 2023-06-22 ENCOUNTER — Ambulatory Visit (INDEPENDENT_AMBULATORY_CARE_PROVIDER_SITE_OTHER): Payer: Medicare HMO

## 2023-06-22 ENCOUNTER — Other Ambulatory Visit: Payer: Self-pay

## 2023-06-22 ENCOUNTER — Telehealth: Payer: Self-pay

## 2023-06-22 VITALS — Ht 61.0 in | Wt 143.0 lb

## 2023-06-22 DIAGNOSIS — F17210 Nicotine dependence, cigarettes, uncomplicated: Secondary | ICD-10-CM

## 2023-06-22 DIAGNOSIS — Z0000 Encounter for general adult medical examination without abnormal findings: Secondary | ICD-10-CM | POA: Diagnosis not present

## 2023-06-22 DIAGNOSIS — Z87891 Personal history of nicotine dependence: Secondary | ICD-10-CM

## 2023-06-22 DIAGNOSIS — Z122 Encounter for screening for malignant neoplasm of respiratory organs: Secondary | ICD-10-CM

## 2023-06-22 NOTE — Patient Instructions (Signed)
Victoria Deleon , Thank you for taking time to come for your Medicare Wellness Visit. I appreciate your ongoing commitment to your health goals. Please review the following plan we discussed and let me know if I can assist you in the future.   Referrals/Orders/Follow-Ups/Clinician Recommendations:  Next Medicare Annual Wellness Visit:  June 25, 2024 at 1:50 pm virtual visit  You have an order for:  []   2D Mammogram  []   3D Mammogram  []   Bone Density   [x]   Lung Cancer Screening  Please call for appointment:   [facility] [address] [place], [place] [address] [phone]  Make sure to wear two-piece clothing.  No lotions powders or deodorants the day of the appointment Make sure to bring picture ID and insurance card.  Bring list of medications you are currently taking including any supplements.   Schedule your [place] screening mammogram through MyChart!   Log into your MyChart account.  Go to 'Visit' (or 'Appointments' if on mobile App) --> Schedule an Appointment  Under 'Select a Reason for Visit' choose the Mammogram Screening option.  Complete the pre-visit questions and select the time and place that best fits your schedule.    This is a list of the screening recommended for you and due dates:  Health Maintenance  Topic Date Due   Pneumococcal Vaccination (1 of 2 - PCV) Never done   Zoster (Shingles) Vaccine (1 of 2) Never done   Pap with HPV screening  Never done   Screening for Lung Cancer  10/08/2022   Flu Shot  08/29/2023*   Mammogram  06/02/2024   Medicare Annual Wellness Visit  06/21/2024   Colon Cancer Screening  04/24/2028   DTaP/Tdap/Td vaccine (2 - Td or Tdap) 08/28/2030   Hepatitis C Screening  Completed   HIV Screening  Completed   HPV Vaccine  Aged Out   COVID-19 Vaccine  Discontinued  *Topic was postponed. The date shown is not the original due date.    Advanced directives: (Declined) Advance  directive discussed with you today. Even though you declined this today, please call our office should you change your mind, and we can give you the proper paperwork for you to fill out.  Next Medicare Annual Wellness Visit scheduled for next year: yes  Preventive Care 49-39 Years Old, Female Preventive care refers to lifestyle choices and visits with your health care provider that can promote health and wellness. Preventive care visits are also called wellness exams. What can I expect for my preventive care visit? Counseling Your health care provider may ask you questions about your: Medical history, including: Past medical problems. Family medical history. Pregnancy history. Current health, including: Menstrual cycle. Method of birth control. Emotional well-being. Home life and relationship well-being. Sexual activity and sexual health. Lifestyle, including: Alcohol, nicotine or tobacco, and drug use. Access to firearms. Diet, exercise, and sleep habits. Work and work Astronomer. Sunscreen use. Safety issues such as seatbelt and bike helmet use. Physical exam Your health care provider will check your: Height and weight. These may be used to calculate your BMI (body mass index). BMI is a measurement that tells if you are at a healthy weight. Waist circumference. This measures the distance around your waistline. This measurement also tells if you are at a healthy weight and may help predict your risk of certain diseases, such as type 2 diabetes and high blood pressure. Heart rate and blood pressure. Body temperature. Skin for  abnormal spots. What immunizations do I need?  Vaccines are usually given at various ages, according to a schedule. Your health care provider will recommend vaccines for you based on your age, medical history, and lifestyle or other factors, such as travel or where you work. What tests do I need? Screening Your health care provider may recommend screening  tests for certain conditions. This may include: Lipid and cholesterol levels. Diabetes screening. This is done by checking your blood sugar (glucose) after you have not eaten for a while (fasting). Pelvic exam and Pap test. Hepatitis B test. Hepatitis C test. HIV (human immunodeficiency virus) test. STI (sexually transmitted infection) testing, if you are at risk. Lung cancer screening. Colorectal cancer screening. Mammogram. Talk with your health care provider about when you should start having regular mammograms. This may depend on whether you have a family history of breast cancer. BRCA-related cancer screening. This may be done if you have a family history of breast, ovarian, tubal, or peritoneal cancers. Bone density scan. This is done to screen for osteoporosis. Talk with your health care provider about your test results, treatment options, and if necessary, the need for more tests. Follow these instructions at home: Eating and drinking  Eat a diet that includes fresh fruits and vegetables, whole grains, lean protein, and low-fat dairy products. Take vitamin and mineral supplements as recommended by your health care provider. Do not drink alcohol if: Your health care provider tells you not to drink. You are pregnant, may be pregnant, or are planning to become pregnant. If you drink alcohol: Limit how much you have to 0-1 drink a day. Know how much alcohol is in your drink. In the [place] one drink equals one 12 oz bottle of beer (355 mL), one 5 oz glass of wine (148 mL), or one 1 oz glass of hard liquor (44 mL). Lifestyle Brush your teeth every morning and night with fluoride toothpaste. Floss one time each day. Exercise for at least 30 minutes 5 or more days each week. Do not use any products that contain nicotine or tobacco. These products include cigarettes, chewing tobacco, and vaping devices, such as e-cigarettes. If you need help quitting, ask your health care provider. Do not  use drugs. If you are sexually active, practice safe sex. Use a condom or other form of protection to prevent STIs. If you do not wish to become pregnant, use a form of birth control. If you plan to become pregnant, see your health care provider for a prepregnancy visit. Take aspirin only as told by your health care provider. Make sure that you understand how much to take and what form to take. Work with your health care provider to find out whether it is safe and beneficial for you to take aspirin daily. Find healthy ways to manage stress, such as: Meditation, yoga, or listening to music. Journaling. Talking to a trusted person. Spending time with friends and family. Minimize exposure to UV radiation to reduce your risk of skin cancer. Safety Always wear your seat belt while driving or riding in a vehicle. Do not drive: If you have been drinking alcohol. Do not ride with someone who has been drinking. When you are tired or distracted. While texting. If you have been using any mind-altering substances or drugs. Wear a helmet and other protective equipment during sports activities. If you have firearms in your house, make sure you follow all gun safety procedures. Seek help if you have been physically or sexually abused. What's  next? Visit your health care provider once a year for an annual wellness visit. Ask your health care provider how often you should have your eyes and teeth checked. Stay up to date on all vaccines. This information is not intended to replace advice given to you by your health care provider. Make sure you discuss any questions you have with your health care provider. Document Revised: 11/12/2020 Document Reviewed: 11/12/2020 Elsevier Patient Education  2024 Elsevier Inc.Lung Cancer Screening A lung cancer screening is a test that checks for lung cancer when there are no symptoms or history of that disease. The screening is done to look for lung cancer in its very  early stages. Finding cancer early improves the chances of successful treatment. It may save your life. Who should have a screening? You should be screened for lung cancer if all of these apply: You currently smoke or you used to smoke. You are between the ages of 24 and 49 years old. Screening may be recommended up to age 62 depending on your overall health and other factors. You have a smoking history of 1 pack of cigarettes a day for 20 years or 2 packs a day for 10 years. How is screening done?  The recommended screening test is a low-dose computed tomography (LDCT) scan. This scan takes detailed images of the lungs. This allows a health care provider to look for abnormal cells. If you are at risk for lung cancer, it is recommended that you get screened once a year. Talk to your health care provider about the risks, benefits, and limitations of screening. What are the benefits of screening? Screening can find lung cancer early, before symptoms start and before it has spread outside of the lungs. The chances of curing lung cancer are greater if the cancer is diagnosed early. What are the risks of screening? The screening may show lung cancer when no cancer is present. Talk with your health care provider about what your results mean. In some cases, your health care provider may do more testing to confirm the results. The screening may not find lung cancer when it is present. You will be exposed to radiation from repeated LDCT tests, which can cause cancer in otherwise healthy people. How can I lower my risk of lung cancer? Make these lifestyle changes to lower your risk of developing lung cancer: Do not use any products that contain nicotine or tobacco. These products include cigarettes, chewing tobacco, and vaping devices, such as e-cigarettes. If you need help quitting, ask your health care provider. Avoid secondhand smoke. Avoid exposure to radiation. Avoid exposure to radon gas. Have your  home checked for radon regularly. Avoid things that cause cancer (carcinogens). Avoid living or working in places with high air pollution or diesel exhaust. Questions to ask your health care provider Am I eligible for lung cancer screening? Does my health insurance cover the cost of lung cancer screening? What happens if the lung cancer screening shows something of concern? How soon will I have results from my lung cancer screening? Is there anything that I need to do to prepare for my lung cancer screening? What happens if I decide not to have lung cancer screening? Where to find more information Ask your health care provider about the risks and benefits of screening. More information and resources are available from these organizations: American Cancer Society (ACS): cancer.org American Lung Association: lung.org National Cancer Institute: cancer.gov Contact a health care provider if: You start to show symptoms of lung  cancer, including: A cough that will not go away. High-pitched whistling sounds when you breathe, most often when you breathe out (wheezing). Chest pain. Coughing up blood. Shortness of breath. Weight loss that cannot be explained. Constant tiredness (fatigue). Hoarse voice. Summary Lung cancer screening may find lung cancer before symptoms appear. Finding cancer early improves the chances of successful treatment. It may save your life. The recommended screening test is a low-dose computed tomography (LDCT) scan that looks for abnormal cells in the lungs. If you are at risk for lung cancer, it is recommended that you get screened once a year. You can make lifestyle changes to lower your risk of lung cancer. Ask your health care provider about the risks and benefits of screening. This information is not intended to replace advice given to you by your health care provider. Make sure you discuss any questions you have with your health care provider. Document Revised:  05/25/2022 Document Reviewed: 11/05/2020 Elsevier Patient Education  2024 Elsevier Inc.Steps to Quit Smoking Smoking tobacco is the leading cause of preventable death. It can affect almost every organ in the body. Smoking puts you and people around you at risk for many serious, long-lasting (chronic) diseases. Quitting smoking can be hard, but it is one of the best things that you can do for your health. It is never too late to quit. Do not give up if you cannot quit the first time. Some people need to try many times to quit. Do your best to stick to your quit plan, and talk with your doctor if you have any questions or concerns. How do I get ready to quit? Pick a date to quit. Set a date within the next 2 weeks to give you time to prepare. Write down the reasons why you are quitting. Keep this list in places where you will see it often. Tell your family, friends, and co-workers that you are quitting. Their support is important. Talk with your doctor about the choices that may help you quit. Find out if your health insurance will pay for these treatments. Know the people, places, things, and activities that make you want to smoke (triggers). Avoid them. What first steps can I take to quit smoking? Throw away all cigarettes at home, at work, and in your car. Throw away the things that you use when you smoke, such as ashtrays and lighters. Clean your car. Empty the ashtray. Clean your home, including curtains and carpets. What can I do to help me quit smoking? Talk with your doctor about taking medicines and seeing a counselor. You are more likely to succeed when you do both. If you are pregnant or breastfeeding: Talk with your doctor about counseling or other ways to quit smoking. Do not take medicine to help you quit smoking unless your doctor tells you to. Quit right away Quit smoking completely, instead of slowly cutting back on how much you smoke over a period of time. Stopping smoking  right away may be more successful than slowly quitting. Go to counseling. In-person is best if this is an option. You are more likely to quit if you go to counseling sessions regularly. Take medicine You may take medicines to help you quit. Some medicines need a prescription, and some you can buy over-the-counter. Some medicines may contain a drug called nicotine to replace the nicotine in cigarettes. Medicines may: Help you stop having the desire to smoke (cravings). Help to stop the problems that come when you stop smoking (withdrawal symptoms).  Your doctor may ask you to use: Nicotine patches, gum, or lozenges. Nicotine inhalers or sprays. Non-nicotine medicine that you take by mouth. Find resources Find resources and other ways to help you quit smoking and remain smoke-free after you quit. They include: Online chats with a Veterinary surgeon. Phone quitlines. Printed Materials engineer. Support groups or group counseling. Text messaging programs. Mobile phone apps. Use apps on your mobile phone or tablet that can help you stick to your quit plan. Examples of free services include Quit Guide from the CDC and smokefree.gov  What can I do to make it easier to quit?  Talk to your family and friends. Ask them to support and encourage you. Call a phone quitline, such as 1-800-QUIT-NOW, reach out to support groups, or work with a Veterinary surgeon. Ask people who smoke to not smoke around you. Avoid places that make you want to smoke, such as: Bars. Parties. Smoke-break areas at work. Spend time with people who do not smoke. Lower the stress in your life. Stress can make you want to smoke. Try these things to lower stress: Getting regular exercise. Doing deep-breathing exercises. Doing yoga. Meditating. What benefits will I see if I quit smoking? Over time, you may have: A better sense of smell and taste. Less coughing and sore throat. A slower heart rate. Lower blood pressure. Clearer  skin. Better breathing. Fewer sick days. Summary Quitting smoking can be hard, but it is one of the best things that you can do for your health. Do not give up if you cannot quit the first time. Some people need to try many times to quit. When you decide to quit smoking, make a plan to help you succeed. Quit smoking right away, not slowly over a period of time. When you start quitting, get help and support to keep you smoke-free. This information is not intended to replace advice given to you by your health care provider. Make sure you discuss any questions you have with your health care provider. Document Revised: 05/08/2021 Document Reviewed: 05/08/2021 Elsevier Patient Education  2024 [employer].  Understanding Your Risk for Falls Millions of people have serious injuries from falls each year. It is important to understand your risk of falling. Talk with your health care provider about your risk and what you can do to lower it. If you do have a serious fall, make sure to tell your provider. Falling once raises your risk of falling again. How can falls affect me? Serious injuries from falls are common. These include: Broken bones, such as hip fractures. Head injuries, such as traumatic brain injuries (TBI) or concussions. A fear of falling can cause you to avoid activities and stay at home. This can make your muscles weaker and raise your risk for a fall. What can increase my risk? There are a number of risk factors that increase your risk for falling. The more risk factors you have, the higher your risk of falling. Serious injuries from a fall happen most often to people who are older than 59 years old. Teenagers and young adults ages 40-29 are also at higher risk. Common risk factors include: Weakness in the lower body. Being generally weak or confused due to long-term (chronic) illness. Dizziness or balance problems. Poor vision. Medicines that cause dizziness or drowsiness. These  may include: Medicines for your blood pressure, heart, anxiety, insomnia, or swelling (edema). Pain medicines. Muscle relaxants. Other risk factors include: Drinking alcohol. Having had a fall in the past. Having foot pain or  wearing improper footwear. Working at a dangerous job. Having any of the following in your home: Tripping hazards, such as floor clutter or loose rugs. Poor lighting. Pets. Having dementia or memory loss. What actions can I take to lower my risk of falling?     Physical activity Stay physically fit. Do strength and balance exercises. Consider taking a regular class to build strength and balance. Yoga and tai chi are good options. Vision Have your eyes checked every year and your prescription for glasses or contacts updated as needed. Shoes and walking aids Wear non-skid shoes. Wear shoes that have rubber soles and low heels. Do not wear high heels. Do not walk around the house in socks or slippers. Use a cane or walker as told by your provider. Home safety Attach secure railings on both sides of your stairs. Install grab bars for your bathtub, shower, and toilet. Use a non-skid mat in your bathtub or shower. Attach bath mats securely with double-sided, non-slip rug tape. Use good lighting in all rooms. Keep a flashlight near your bed. Make sure there is a clear path from your bed to the bathroom. Use night-lights. Do not use throw rugs. Make sure all carpeting is taped or tacked down securely. Remove all clutter from walkways and stairways, including extension cords. Repair uneven or broken steps and floors. Avoid walking on icy or slippery surfaces. Walk on the grass instead of on icy or slick sidewalks. Use ice melter to get rid of ice on walkways in the winter. Use a cordless phone. Questions to ask your health care provider Can you help me check my risk for a fall? Do any of my medicines make me more likely to fall? Should I take a vitamin D  supplement? What exercises can I do to improve my strength and balance? Should I make an appointment to have my vision checked? Do I need a bone density test to check for weak bones (osteoporosis)? Would it help to use a cane or a walker? Where to find more information Centers for Disease Control and Prevention, [employer]: TonerPromos.no Community-Based Fall Prevention Programs: TonerPromos.no [employer] on Aging: BaseRingTones.pl Contact a health care provider if: You fall at home. You are afraid of falling at home. You feel weak, drowsy, or dizzy. This information is not intended to replace advice given to you by your health care provider. Make sure you discuss any questions you have with your health care provider. Document Revised: 01/18/2022 Document Reviewed: 01/18/2022 Elsevier Patient Education  2024 [employer].

## 2023-06-22 NOTE — Progress Notes (Signed)
Because this visit was a virtual/telehealth visit,  certain criteria was not obtained, such a blood pressure, CBG if applicable, and timed get up and go. Any medications not marked as "taking" were not mentioned during the medication reconciliation part of the visit. Any vitals not documented were not able to be obtained due to this being a telehealth visit or patient was unable to self-report a recent blood pressure reading due to a lack of equipment at home via telehealth. Vitals that have been documented are verbally provided by the patient.  Interactive audio and video telecommunications were attempted between this provider and patient, however failed, due to patient having technical difficulties OR patient did not have access to video capability.  We continued and completed visit with audio only.  Subjective:   Victoria Deleon is a 58 y.o. female who presents for Medicare Annual (Subsequent) preventive examination.  Visit Complete: Virtual I connected with  Victoria Deleon on 06/22/23 by a audio enabled telemedicine application and verified that I am speaking with the correct person using two identifiers.  Patient Location: Home  Provider Location: Office/Clinic  I discussed the limitations of evaluation and management by telemedicine. The patient expressed understanding and agreed to proceed.  Vital Signs: Because this visit was a virtual/telehealth visit, some criteria may be missing or patient reported. Any vitals not documented were not able to be obtained and vitals that have been documented are patient reported.  Patient Medicare AWV questionnaire was completed by the patient on 06/22/2023; I have confirmed that all information answered by patient is correct and no changes since this date.  Cardiac Risk Factors include: advanced age (>31men, >25 women);dyslipidemia;hypertension;smoking/ tobacco exposure     Objective:    Today's Vitals   06/22/23 0801 06/22/23 0804  Weight:  143 lb (64.9 kg)   Height: 5\' 1"  (1.549 m)   PainSc:  5    Body mass index is 27.02 kg/m.     10/15/2020    9:11 AM 09/26/2019    2:07 PM 09/10/2019    1:20 PM 08/30/2019    9:15 AM 04/24/2018    9:17 AM 04/18/2018   10:12 AM  Advanced Directives  Does Patient Have a Medical Advance Directive? No No No No No No  Would patient like information on creating a medical advance directive? No - Patient declined No - Patient declined No - Patient declined  No - Patient declined No - Patient declined    Current Medications (verified) Outpatient Encounter Medications as of 06/22/2023  Medication Sig   atorvastatin (LIPITOR) 40 MG tablet Take 1 tablet (40 mg total) by mouth daily.   DULoxetine (CYMBALTA) 60 MG capsule Take 2 capsules (120 mg total) by mouth daily.   EPINEPHrine 0.3 mg/0.3 mL IJ SOAJ injection Inject 0.3 mg into the muscle as needed for anaphylaxis.   FIBER PO Take 1 capsule by mouth 2 (two) times daily.    levothyroxine (SYNTHROID) 137 MCG tablet Take 1 tablet (137 mcg total) by mouth daily before breakfast.   omeprazole (PRILOSEC) 20 MG capsule Take 20 mg by mouth daily.   traZODone (DESYREL) 100 MG tablet Take a full tablet nightly before bed.  You can take half a tablet as needed during the night.   No facility-administered encounter medications on file as of 06/22/2023.    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 Jena Gauss: Normal terminal ileum with negative biopsies, normal-appearing colon with random colon biopsies negative, internal hemorrhoids.  Next colonoscopy 10 years.   ESOPHAGOGASTRODUODENOSCOPY (EGD) WITH PROPOFOL N/A 04/24/2018   Dr. Jena Gauss: Severe ulcerative reflux esophagitis, small hiatal hernia, small bowel biopsies negative for celiac.   EYE SURGERY N/A    [facility]reesia 03/04/2020   JOINT REPLACEMENT N/A    [facility]ia 01/22/2020   MEDIAL PARTIAL KNEE REPLACEMENT Left    TOTAL KNEE REVISION Left 09/10/2019   Procedure: LEFT TOTAL KNEE REVISION;  Surgeon: Gean Birchwood, MD;  Location: [facility];  Service: Orthopedics;  Laterality: Left;   Family History  Adopted: Yes  Problem Relation Age of Onset   Colon cancer Neg Hx    Social History   Socioeconomic History   Marital status: Married    Spouse name: Peyton Najjar    Number of children: Not on file   Years of education: Not on file   Highest education level: Not on file  Occupational History    Comment: medical disability   Tobacco Use   Smoking status: Every Day    Current packs/day: 0.25    Average packs/day: 0.3 packs/day for 30.0 years (7.5 ttl pk-yrs)    Types: Cigarettes   Smokeless tobacco: Never  Vaping Use   Vaping status: Never Used  Substance and Sexual Activity   Alcohol use: Yes    Comment: rare-maybe 2 glasses of wine/year   Drug use: Not Currently    Types: Marijuana    Comment: smoked marijuana starting at age 19 in [place]. Stopped use in 2020 after moving to [place]   Sexual activity: Not Currently    Birth control/protection: Surgical    Comment: hyst  Other Topics Concern   Not on file  Social  History Narrative   Lives with husband Peyton Najjar          Enjoys: kayaking, outside events       Diet: eats all food groups outside meat    Caffeine: tea 2 cups daily    Water: 5-6 cups daily or more      Wears seat belt   Does not use phone while driving    [employer] features editor safe area   Social Drivers of Health   Financial Resource Strain: Medium Risk (06/22/2023)   Overall Financial Resource Strain (CARDIA)    Difficulty of Paying Living Expenses: Somewhat hard  Food Insecurity: Patient Declined (06/22/2023)   Hunger Vital Sign    Worried About Running Out of Food in the Last Year: Patient declined    Ran Out of Food in the Last Year:  Patient declined  Transportation Needs: No Transportation Needs (06/22/2023)   PRAPARE - Administrator, Civil Service (Medical): No    Lack of Transportation (Non-Medical): No  Physical Activity: Insufficiently Active (06/22/2023)   Exercise Vital Sign    Days of Exercise per Week: 3 days    Minutes of Exercise per Session: 30 min  Stress: Stress Concern Present (06/22/2023)   [employer] of Occupational Health - Occupational Stress Questionnaire    Feeling of Stress : To some extent  Social Connections: Unknown (06/22/2023)   Social Connection and Isolation Panel [NHANES]    Frequency of Communication with Friends and Family: Patient declined    Frequency of Social Gatherings with Friends and Family: Patient declined    Attends Religious Services: Patient declined    Database administrator or Organizations: Patient declined    Attends Engineer, structural: Patient declined    Marital Status: Married    Tobacco Counseling Ready to quit: No Counseling given: Yes   Clinical Intake:  Pre-visit preparation completed: Yes  Pain : 0-10 Pain Score: 5  Pain Type: Chronic pain Pain Location: Back Pain Orientation: Lower Pain Descriptors / Indicators: Constant, Aching, Throbbing Pain  Onset: More than a month ago Pain Frequency: Constant     BMI - recorded: 27.02 Nutritional Risks: None Diabetes: No  How often do you need to have someone help you when you read instructions, pamphlets, or other written materials from your doctor or pharmacy?: 1 - Never  Interpreter Needed?: No  Information entered by :: Maryjean Ka CMA   Activities of Daily Living    06/22/2023    8:14 AM  In your present state of health, do you have any difficulty performing the following activities:  Hearing? 0  Vision? 0  Difficulty concentrating or making decisions? 0  Walking or climbing stairs? 0  Dressing or bathing? 0  Doing errands, shopping? 0  Preparing Food and eating ? N  Using the Toilet? N  In the past six months, have you accidently leaked urine? N  Do you have problems with loss of bowel control? N  Managing your Medications? N  Managing your Finances? N  Housekeeping or managing your Housekeeping? N    Patient Care Team: Billie Lade, MD as PCP - General (Internal Medicine) Orbie Pyo, MD as PCP - Cardiology (Cardiology) Jena Gauss Gerrit Friends, MD as Consulting Physician (Gastroenterology)  Indicate any recent Medical Services you may have received from other than [facility] providers in the past year (date may be approximate).     Assessment:   This is a routine wellness examination for Victoria Deleon.  Hearing/Vision screen Hearing Screening - Comments:: Patient denies any hearing difficulties.   Vision Screening - Comments:: Wears rx glasses - up to date with routine eye exams  Patient sees Dr. Daisy Lazar w/ [facility] [place] office.  Has macular degeneration bilaterally    Goals Addressed   None    Depression Screen    06/22/2023    8:16 AM 04/18/2023    8:23 AM 04/26/2022    9:22 AM 03/24/2022   10:41 AM 03/02/2021    8:08 AM 02/13/2021   11:42 AM 11/10/2020    8:56 AM  PHQ 2/9 Scores  PHQ - 2 Score 0 0   0 1 0  PHQ- 9 Score 0 6          Information  is confidential and restricted. Go to Review Flowsheets to unlock data.  Fall Risk    06/22/2023    8:13 AM 04/18/2023    8:23 AM 03/02/2021    8:08 AM 02/13/2021   11:42 AM 11/10/2020    8:56 AM  Fall Risk   Falls in the past year? 1 0 0 0 0  Number falls in past yr: 1 0 0 0 0  Injury with Fall? 1 0 0 0 0  Risk for fall due to : History of fall(s);Impaired balance/gait;Orthopedic patient;Impaired mobility;Other (Comment) No Fall Risks No Fall Risks No Fall Risks No Fall Risks  Risk for fall due to: Comment hx of TKA LT and torn meniscus RT.      Follow up Education provided;Falls prevention discussed Falls evaluation completed Falls evaluation completed Falls evaluation completed Falls evaluation completed    MEDICARE RISK AT HOME: Medicare Risk at Home Any stairs in or around the home?: Yes If so, are there any without handrails?: No Home free of loose throw rugs in walkways, pet beds, electrical cords, etc?: Yes Adequate lighting in your home to reduce risk of falls?: Yes Life alert?: No Use of a cane, walker or w/c?: No Grab bars in the bathroom?: Yes Shower chair or bench in shower?: No Elevated toilet seat or a handicapped toilet?: No  TIMED UP AND GO:  Was the test performed?  No    Cognitive Function:        06/22/2023    8:11 AM  6CIT Screen  What Year? 0 points  What month? 0 points  What time? 0 points  Count back from 20 0 points  Months in reverse 0 points  Repeat phrase 0 points  Total Score 0 points    Immunizations Immunization History  Administered Date(s) Administered   Moderna Sars-Covid-2 Vaccination 11/15/2019, 01/04/2020   Tdap 08/27/2020    TDAP status: Up to date  Flu Vaccine status: Declined, Education has been provided regarding the importance of this vaccine but patient still declined. Advised may receive this vaccine at local pharmacy or Health Dept. Aware to provide a copy of the vaccination record if obtained from local pharmacy  or Health Dept. Verbalized acceptance and understanding.  Pneumococcal vaccine status: Declined,  Education has been provided regarding the importance of this vaccine but patient still declined. Advised may receive this vaccine at local pharmacy or Health Dept. Aware to provide a copy of the vaccination record if obtained from local pharmacy or Health Dept. Verbalized acceptance and understanding.   Covid-19 vaccine status: Declined, Education has been provided regarding the importance of this vaccine but patient still declined. Advised may receive this vaccine at local pharmacy or Health Dept.or vaccine clinic. Aware to provide a copy of the vaccination record if obtained from local pharmacy or Health Dept. Verbalized acceptance and understanding.  Qualifies for Shingles Vaccine? Yes   Shingrix Vaccine: Patient declined vaccine.  Due, Education has been provided regarding the importance of this vaccine. Advised may receive this vaccine at local pharmacy or Health Dept. Aware to provide a copy of the vaccination record if obtained from local pharmacy or Health Dept. Verbalized acceptance and understanding.   Screening Tests Health Maintenance  Topic Date Due   Pneumococcal Vaccine 52-15 Years old (1 of 2 - PCV) Never done   Zoster Vaccines- Shingrix (1 of 2) Never done   Cervical Cancer Screening (HPV/Pap Cotest)  Never done   Medicare Annual Wellness (AWV)  06/09/2022   INFLUENZA VACCINE  08/29/2023 (Originally 12/30/2022)   MAMMOGRAM  06/02/2025   Colonoscopy  04/24/2028   DTaP/Tdap/Td (2 - Td or Tdap) 08/28/2030   Hepatitis C Screening  Completed   HIV Screening  Completed   HPV VACCINES  Aged Out   COVID-19 Vaccine  Discontinued    Health Maintenance  Health Maintenance Due  Topic Date Due   Pneumococcal Vaccine 55-33 Years old (1 of 2 - PCV) Never done   Zoster Vaccines- Shingrix (1 of 2) Never done   Cervical Cancer Screening (HPV/Pap Cotest)  Never done   Medicare Annual  Wellness (AWV)  06/09/2022    Colorectal cancer screening: Type of screening: Colonoscopy. Completed 04/24/2018. Repeat every 10 years  Mammogram status: Completed 06/03/2023. Repeat every year  Bone Density Screening: Not age appropriate for this patient.    Lung Cancer Screening: (Low Dose CT Chest recommended if Age 18-80 years, 20 pack-year currently smoking OR have quit w/in 15years.) does qualify.   Lung Cancer Screening Referral: 06/22/2023  Additional Screening:  Hepatitis C Screening: does not qualify; Completed   Vision Screening: Recommended annual ophthalmology exams for early detection of glaucoma and other disorders of the eye. Is the patient up to date with their annual eye exam?  Yes  Who is the provider or what is the name of the office in which the patient attends annual eye exams? Patient sees Dr. Daisy Lazar w/ [facility] [place] office.  If pt is not established with a provider, would they like to be referred to a provider to establish care? No .   Dental Screening: Recommended annual dental exams for proper oral hygiene  Diabetic Foot Exam: na  Community Resource Referral / Chronic Care Management: CRR required this visit?  No   CCM required this visit?  No     Plan:     I have personally reviewed and noted the following in the patient's chart:   Medical and social history Use of alcohol, tobacco or illicit drugs  Current medications and supplements including opioid prescriptions. Patient is not currently taking opioid prescriptions. Functional ability and status Nutritional status Physical activity Advanced directives List of other physicians Hospitalizations, surgeries, and ER visits in previous 12 months Vitals Screenings to include cognitive, depression, and falls Referrals and appointments  In addition, I have reviewed and discussed with patient certain preventive protocols, quality metrics, and best practice recommendations. A written  personalized care plan for preventive services as well as general preventive health recommendations were provided to patient.     Jordan Hawks Marceline Napierala, CMA   06/22/2023   After Visit Summary: (MyChart) Due to this being a telephonic visit, the after visit summary with patients personalized plan was offered to patient via MyChart   Nurse Notes: see routing comment

## 2023-06-22 NOTE — Telephone Encounter (Addendum)
.  Lung Cancer Screening Narrative/Criteria Questionnaire (Cigarette Smokers Only- No Cigars/Pipes/vapes)   [place]   SDMV:06/29/2023 at 10:00 am with Marcelline Mates, RN   02-25-1966   LDCT: 07/04/2023 at 10:30 am with Dorene Grebe    58 y.o.   Phone: [phone]  Lung Screening Narrative (confirm age 75-77 yrs Medicare / 50-80 yrs Private pay insurance)   Insurance information:[employer]   Referring Provider:[place]   This screening involves an initial phone call with a team member from our program. It is called a shared decision making visit. The initial meeting is required by  insurance and Medicare to make sure you understand the program. This appointment takes about 15-20 minutes to complete. You will complete the screening scan at your scheduled date/time.  This scan takes about 5-10 minutes to complete. You can eat and drink normally before and after the scan.  Criteria questions for Lung Cancer Screening:   Are you a current or former smoker? Current Age began smoking: 57   If you are a former smoker, what year did you quit smoking?Quit 6 years (within 15 yrs)   To calculate your smoking history, I need an accurate estimate of how many packs of cigarettes you smoked per day and for how many years. (Not just the number of PPD you are now smoking)   Years smoking 29 x Packs per day 1 = Pack years 29   (at least 20 pack yrs)   (Make sure they understand that we need to know how much they have smoked in the past, not just the number of PPD they are smoking now)  Do you have a personal history of cancer?  No    Do you have a family history of cancer? No  Are you coughing up blood?  No  Have you had unexplained weight loss of 15 lbs or more in the last 6 months? 20 since last summer 2024    It looks like you meet all criteria.  When would be a good time for [place] to schedule you for this screening?   Additional information:

## 2023-06-27 ENCOUNTER — Telehealth (HOSPITAL_COMMUNITY): Payer: Medicare HMO | Admitting: Psychiatry

## 2023-06-29 ENCOUNTER — Ambulatory Visit (INDEPENDENT_AMBULATORY_CARE_PROVIDER_SITE_OTHER): Payer: Medicare HMO | Admitting: Acute Care

## 2023-06-29 DIAGNOSIS — F17210 Nicotine dependence, cigarettes, uncomplicated: Secondary | ICD-10-CM

## 2023-06-29 NOTE — Patient Instructions (Signed)

## 2023-06-29 NOTE — Progress Notes (Addendum)
 Provider Attestation I agree with the documentation of the Shared Decision Making visit,  smoking cessation counseling if appropriate, and verification or eligibility for lung cancer screening as documented by the RN Nurse Navigator.   Raejean Bullock, MSN, AGACNP-BC [facility] See Amion for personal pager PCCM on call pager [phone]     Virtual Visit via Video Note  I connected with Victoria Deleon on 06/29/23 at 10:00 AM EST by a video enabled telemedicine application and verified that I am speaking with the correct person using two identifiers.  Location: Patient: Victoria Deleon. [place] Provider: Alyse Bach, RN   I discussed the limitations of evaluation and management by telemedicine and the availability of in person appointments. The patient expressed understanding and agreed to proceed.   Shared Decision Making Visit Lung Cancer Screening Program [phone])   Eligibility: Age 58 y.o. Pack Years Smoking History Calculation 29 (# packs/per year x # years smoked) Recent History of coughing up blood  no Unexplained weight loss? no ( >Than 15 pounds within the last 6 months ) Prior History Lung / other cancer no (Diagnosis within the last 5 years already requiring surveillance chest CT Scans). Smoking Status Current Smoker Former Smokers: Years since quit: n/a  Quit Date: n/a  Visit Components: Discussion included one or more decision making aids. yes Discussion included risk/benefits of screening. yes Discussion included potential follow up diagnostic testing for abnormal scans. yes Discussion included meaning and risk of over diagnosis. yes Discussion included meaning and risk of False Positives. yes Discussion included meaning of total radiation exposure. yes  Counseling Included: Importance of adherence to annual lung cancer LDCT screening. yes Impact of comorbidities on ability to participate in the program. yes Ability and  willingness to under diagnostic treatment. yes  Smoking Cessation Counseling: Current Smokers:  Discussed importance of smoking cessation. yes Information about tobacco cessation classes and interventions provided to patient. yes Patient provided with "ticket" for LDCT Scan. no Symptomatic Patient. no  Counseling(Intermediate counseling: > three minutes) [id number] Diagnosis Code: Tobacco Use Z72.0 Asymptomatic Patient yes  Counseling (Intermediate counseling: > three minutes counseling) [id number] Former Smokers:  Discussed the importance of maintaining cigarette abstinence. yes Diagnosis Code: Personal History of Nicotine Dependence. G95.621 Information about tobacco cessation classes and interventions provided to patient. Yes Patient provided with "ticket" for LDCT Scan. no Written Order for Lung Cancer Screening with LDCT placed in Epic. Yes (CT Chest Lung Cancer Screening Low Dose W/O CM) [id number] Z12.2-Screening of respiratory organs Z87.891-Personal history of nicotine dependence   Alyse Bach, RN

## 2023-07-04 ENCOUNTER — Ambulatory Visit (HOSPITAL_COMMUNITY)
Admission: RE | Admit: 2023-07-04 | Discharge: 2023-07-04 | Disposition: A | Discharge status: Home / Self Care | Payer: Medicare HMO | Source: Ambulatory Visit | Attending: Acute Care | Admitting: Acute Care

## 2023-07-04 DIAGNOSIS — Z122 Encounter for screening for malignant neoplasm of respiratory organs: Secondary | ICD-10-CM | POA: Insufficient documentation

## 2023-07-04 DIAGNOSIS — F17210 Nicotine dependence, cigarettes, uncomplicated: Secondary | ICD-10-CM | POA: Diagnosis not present

## 2023-07-04 DIAGNOSIS — Z87891 Personal history of nicotine dependence: Secondary | ICD-10-CM | POA: Insufficient documentation

## 2023-07-12 ENCOUNTER — Other Ambulatory Visit: Payer: Self-pay

## 2023-07-12 DIAGNOSIS — Z122 Encounter for screening for malignant neoplasm of respiratory organs: Secondary | ICD-10-CM

## 2023-07-12 DIAGNOSIS — F17210 Nicotine dependence, cigarettes, uncomplicated: Secondary | ICD-10-CM

## 2023-07-12 DIAGNOSIS — Z87891 Personal history of nicotine dependence: Secondary | ICD-10-CM

## 2023-07-19 ENCOUNTER — Encounter: Payer: Self-pay | Admitting: Internal Medicine

## 2023-07-19 ENCOUNTER — Ambulatory Visit (INDEPENDENT_AMBULATORY_CARE_PROVIDER_SITE_OTHER): Payer: Medicare HMO | Admitting: Internal Medicine

## 2023-07-19 VITALS — BP 137/82 | HR 69 | Ht 61.0 in | Wt 145.2 lb

## 2023-07-19 DIAGNOSIS — K219 Gastro-esophageal reflux disease without esophagitis: Secondary | ICD-10-CM | POA: Diagnosis not present

## 2023-07-19 DIAGNOSIS — R7303 Prediabetes: Secondary | ICD-10-CM | POA: Diagnosis not present

## 2023-07-19 DIAGNOSIS — E063 Autoimmune thyroiditis: Secondary | ICD-10-CM

## 2023-07-19 DIAGNOSIS — R6881 Early satiety: Secondary | ICD-10-CM | POA: Diagnosis not present

## 2023-07-19 DIAGNOSIS — I10 Essential (primary) hypertension: Secondary | ICD-10-CM | POA: Diagnosis not present

## 2023-07-19 DIAGNOSIS — F17200 Nicotine dependence, unspecified, uncomplicated: Secondary | ICD-10-CM | POA: Diagnosis not present

## 2023-07-19 MED ORDER — LEVOTHYROXINE SODIUM 125 MCG PO TABS: 125.0000 ug | ORAL_TABLET | Freq: Every day | ORAL | 3 refills | Status: DC

## 2023-07-19 NOTE — Progress Notes (Signed)
 Established Patient Office Visit  Subjective   Patient ID: Lyndsy Gilberto, female    DOB: December 21, 1965  Age: 58 y.o. MRN: [id number]  Chief Complaint  Patient presents with   Hypertension    Three month follow up    Abdominal Pain    Abdominal pain, no appetite    Ms. Bergevin returns to care today for routine follow-up.  She was last evaluated by me in November 2024 as a new patient presenting to establish care.  No medication changes were made at that time and 72-month follow-up was arranged.  In the interim she has undergone lung cancer screening.  There have otherwise been no acute interval events.  Today she has multiple concerns to discuss.  She endorses frequent nausea and non-bloody diarrhea intermittently.  Bowel habits are irregular.  She additionally describes early satiety.  Symptoms have started since her last appointment.  She is interested in establishing care with gastroenterology.  She also endorses sinus congestion with clear nasal secretions.  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 Jena Gauss: Normal terminal ileum with negative  biopsies, normal-appearing colon with random colon biopsies negative, internal hemorrhoids.  Next colonoscopy 10 years.   ESOPHAGOGASTRODUODENOSCOPY (EGD) WITH PROPOFOL N/A 04/24/2018   Dr. Jena Gauss: Severe ulcerative reflux esophagitis, small hiatal hernia, small bowel biopsies negative for celiac.   EYE SURGERY N/A    Phreesia 03/04/2020   JOINT REPLACEMENT N/A    Phreesia 01/22/2020   MEDIAL PARTIAL KNEE REPLACEMENT Left    TOTAL KNEE REVISION Left 09/10/2019   Procedure: LEFT TOTAL KNEE REVISION;  Surgeon: Gean Birchwood, MD;  Location: [facility];  Service: Orthopedics;  Laterality: Left;   Social History   Tobacco Use   Smoking status: Every Day    Current packs/day: 1.00    Average packs/day: 1 pack/day for 35.2 years (35.2 ttl pk-yrs)    Types: Cigarettes    Start date: 28   Smokeless tobacco: Never  Vaping Use   Vaping status: Never Used  Substance Use Topics   Alcohol use: Yes    Comment: rare-maybe 2 glasses of wine/year   Drug use: Not Currently    Types: Marijuana    Comment: smoked marijuana starting at age 68 in [place]. Stopped use in 2020 after moving to [place]   Family History  Adopted: Yes  Problem Relation Age of Onset   Colon cancer Neg Hx    Allergies  Allergen Reactions   Codeine Anaphylaxis   Review of Systems  Constitutional:  Negative for chills and fever.  HENT:  Positive for congestion. Negative for sore throat.   Respiratory:  Negative for cough and shortness of breath.   Cardiovascular:  Negative for chest pain, palpitations and leg swelling.  Gastrointestinal:  Positive for abdominal pain, diarrhea, nausea and vomiting. Negative for blood in stool and constipation.       Early satiety  Genitourinary:  Negative for dysuria and hematuria.  Musculoskeletal:  Negative for myalgias.  Skin:  Negative for itching and rash.  Neurological:  Negative for dizziness and headaches.  Psychiatric/Behavioral:  Negative for depression and suicidal ideas.       Objective:     BP 137/82 (BP Location: Right Arm, Patient Position: Sitting, Cuff Size: Normal)   Pulse 69   Ht 5\' 1"  (1.549 m)   Wt 145 lb 3.2 oz (65.9 kg)   SpO2 94%   BMI 27.44 kg/m  BP Readings from Last 3 Encounters:  07/19/23 137/82  04/18/23 122/82  03/01/23 116/82   Physical Exam Vitals reviewed.  Constitutional:      General: She is not in acute distress.    Appearance: Normal appearance. She is not toxic-appearing.  HENT:     Head: Normocephalic and atraumatic.     Right Ear: External ear normal.     Left Ear: External ear normal.     Nose: Congestion present. No rhinorrhea.     Mouth/Throat:     Mouth: Mucous membranes are moist.     Pharynx: Oropharynx is clear. No oropharyngeal exudate or posterior oropharyngeal erythema.  Eyes:     General: No scleral icterus.    Extraocular Movements: Extraocular movements intact.     Conjunctiva/sclera: Conjunctivae normal.     Pupils: Pupils are equal, round, and reactive to light.  Cardiovascular:     Rate and Rhythm: Normal rate and regular rhythm.     Pulses: Normal pulses.     Heart sounds: Normal heart sounds. No murmur heard.    No friction rub. No gallop.  Pulmonary:     Effort: Pulmonary effort is normal.     Breath sounds: Normal breath sounds. No wheezing, rhonchi or rales.  Abdominal:     General: Abdomen is flat. Bowel sounds are normal. There is no distension.     Palpations: Abdomen is soft.     Tenderness: There is no abdominal tenderness.  Musculoskeletal:        General: No swelling. Normal range of motion.     Cervical back: Normal range of motion.     Right lower leg: No edema.     Left lower leg: No edema.  Lymphadenopathy:     Cervical: No cervical adenopathy.  Skin:    General: Skin is warm and dry.     Capillary Refill: Capillary refill takes less than 2 seconds.     Coloration: Skin is not jaundiced.  Neurological:     General: No focal deficit present.     Mental Status: She is  alert and oriented to person, place, and time.  Psychiatric:        Mood and Affect: Mood normal.        Behavior: Behavior normal.   Last CBC Lab Results  Component Value Date   WBC 6.7 04/18/2023   HGB 15.0 04/18/2023   HCT 44.7 04/18/2023   MCV 93 04/18/2023   MCH 31.3 04/18/2023   RDW 12.7 04/18/2023   PLT 337 04/18/2023   Last metabolic panel Lab Results  Component Value Date   GLUCOSE 112 (  H) 04/18/2023   NA 139 04/18/2023   K 4.4 04/18/2023   CL 102 04/18/2023   CO2 20 04/18/2023   BUN 9 04/18/2023   CREATININE 0.76 04/18/2023   EGFR 91 04/18/2023   CALCIUM 10.0 04/18/2023   PROT 6.9 04/18/2023   ALBUMIN 4.6 04/18/2023   LABGLOB 2.3 04/18/2023   AGRATIO 2.2 03/02/2021   BILITOT 0.5 04/18/2023   ALKPHOS 106 04/18/2023   AST 16 04/18/2023   ALT 11 04/18/2023   ANIONGAP 9 08/30/2019   Last lipids Lab Results  Component Value Date   CHOL 152 04/07/2022   HDL 55 04/07/2022   LDLCALC 76 04/07/2022   TRIG 118 04/07/2022   CHOLHDL 2.8 04/07/2022   Last hemoglobin A1c Lab Results  Component Value Date   HGBA1C 6.1 (H) 04/18/2023   Last thyroid functions Lab Results  Component Value Date   TSH 48.800 (H) 07/19/2023   Last vitamin D Lab Results  Component Value Date   VD25OH 49.8 04/18/2023   Last vitamin B12 and Folate Lab Results  Component Value Date   VITAMINB12 877 04/18/2023   FOLATE 14.7 04/18/2023   The 10-year ASCVD risk score (Arnett DK, et al., 2019) is: 10.1%    Assessment & Plan:   Problem List Items Addressed This Visit       Essential hypertension   Remains normotensive off antihypertensive medication.      GERD (gastroesophageal reflux disease)   Reflux symptoms remain well-controlled with omeprazole 20 mg daily.      Hypothyroidism - Primary   TSH 10.4 on labs from November 2024.  I recommended increasing levothyroxine to 137 mcg daily in light of this result.  Today she endorses multiple GI symptoms, including nausea  and intermittent nonbloody diarrhea.  Etiology is unclear.  Will update thyroid studies and recommend that she reduce levothyroxine back to 125 mcg daily in case this is contributing to her symptoms.      Tobacco use disorder (Chronic)   She continues to smoke cigarettes but has cut back to 2 per day.  She was congratulated on her progress and complete cessation was once again encouraged.      Prediabetes   A1c 6.1 on labs from November 2024.  She will continue to focus on dietary changes in an effort to lower her A1c.      Early satiety   Her acute concern today is multiple GI symptoms that have developed since her last appointment.  She endorses early satiety, irregular bowel habits, nausea with vomiting, and intermittent nonbloody diarrhea.  I recommended increasing levothyroxine to 137 mcg daily in light of lab results from her previous appointment.  While I do not think this explains her symptoms entirely, I have asked her to reduce levothyroxine back to 125 mcg daily in case this is contributing.  She is currently prescribed omeprazole 20 mg daily, which adequately controls reflux symptoms.  There have been no additional medication changes.  Based on her current symptoms, a referral to gastroenterology has been placed.       Return in about 3 months (around 10/16/2023).    Billie Lade, MD

## 2023-07-19 NOTE — Patient Instructions (Signed)
It was a pleasure to see you today.  Thank you for giving [place] the opportunity to be involved in your care.  Below is a brief recap of your visit and next steps.  We will plan to see you again in 3 months.  Summary Reduce levothyroxine back to 125 mcg daily Check thyroid studies Treatment options for sinus congestion reviewed Follow up in 3 months

## 2023-07-20 ENCOUNTER — Encounter: Payer: Self-pay | Admitting: Internal Medicine

## 2023-07-20 LAB — TSH+FREE T4
Free T4: 0.54 ng/dL — ABNORMAL LOW (ref 0.82–1.77)
TSH: 48.8 u[IU]/mL — ABNORMAL HIGH (ref 0.450–4.500)

## 2023-07-21 ENCOUNTER — Ambulatory Visit: Payer: Medicare HMO | Admitting: Internal Medicine

## 2023-08-15 ENCOUNTER — Other Ambulatory Visit: Payer: Self-pay

## 2023-08-15 ENCOUNTER — Encounter: Payer: Self-pay | Admitting: Internal Medicine

## 2023-08-15 ENCOUNTER — Ambulatory Visit: Payer: Medicare HMO | Admitting: Internal Medicine

## 2023-08-15 ENCOUNTER — Ambulatory Visit: Admitting: Internal Medicine

## 2023-08-15 VITALS — BP 126/81 | HR 81 | Ht 61.0 in | Wt 146.8 lb

## 2023-08-15 VITALS — BP 137/84 | HR 60 | Temp 98.0°F | Ht 61.0 in | Wt 146.6 lb

## 2023-08-15 DIAGNOSIS — F1290 Cannabis use, unspecified, uncomplicated: Secondary | ICD-10-CM | POA: Diagnosis not present

## 2023-08-15 DIAGNOSIS — K2210 Ulcer of esophagus without bleeding: Secondary | ICD-10-CM | POA: Diagnosis not present

## 2023-08-15 DIAGNOSIS — Z9049 Acquired absence of other specified parts of digestive tract: Secondary | ICD-10-CM | POA: Diagnosis not present

## 2023-08-15 DIAGNOSIS — R5383 Other fatigue: Secondary | ICD-10-CM | POA: Diagnosis not present

## 2023-08-15 DIAGNOSIS — K5909 Other constipation: Secondary | ICD-10-CM | POA: Diagnosis not present

## 2023-08-15 DIAGNOSIS — F17210 Nicotine dependence, cigarettes, uncomplicated: Secondary | ICD-10-CM

## 2023-08-15 DIAGNOSIS — J329 Chronic sinusitis, unspecified: Secondary | ICD-10-CM | POA: Diagnosis not present

## 2023-08-15 DIAGNOSIS — R634 Abnormal weight loss: Secondary | ICD-10-CM

## 2023-08-15 DIAGNOSIS — K2100 Gastro-esophageal reflux disease with esophagitis, without bleeding: Secondary | ICD-10-CM

## 2023-08-15 DIAGNOSIS — E063 Autoimmune thyroiditis: Secondary | ICD-10-CM | POA: Diagnosis not present

## 2023-08-15 DIAGNOSIS — R6881 Early satiety: Secondary | ICD-10-CM | POA: Insufficient documentation

## 2023-08-15 DIAGNOSIS — D539 Nutritional anemia, unspecified: Secondary | ICD-10-CM | POA: Diagnosis not present

## 2023-08-15 MED ORDER — PANTOPRAZOLE SODIUM 40 MG PO TBEC: 40.0000 mg | DELAYED_RELEASE_TABLET | Freq: Every day | ORAL | 11 refills | Status: DC

## 2023-08-15 MED ORDER — LINACLOTIDE 145 MCG PO CAPS: 145.0000 ug | ORAL_CAPSULE | Freq: Every day | ORAL | 11 refills | Status: AC

## 2023-08-15 NOTE — Assessment & Plan Note (Signed)
 A1c 6.1 on labs from November 2024.  She will continue to focus on dietary changes in an effort to lower her A1c.

## 2023-08-15 NOTE — Assessment & Plan Note (Signed)
 TSH 10.4 on labs from November 2024.  I recommended increasing levothyroxine to 137 mcg daily in light of this result.  Today she endorses multiple GI symptoms, including nausea and intermittent nonbloody diarrhea.  Etiology is unclear.  Will update thyroid studies and recommend that she reduce levothyroxine back to 125 mcg daily in case this is contributing to her symptoms.

## 2023-08-15 NOTE — Assessment & Plan Note (Signed)
 Remains normotensive off antihypertensive medication.

## 2023-08-15 NOTE — Progress Notes (Signed)
 Acute Office Visit  Subjective:     Patient ID: Victoria Deleon, female    DOB: Feb 11, 1966, 58 y.o.   MRN: [id number]  Chief Complaint  Patient presents with   Care Management   URI    Pt states sinus issues since December Coughing the past few weeks and just got over the flu, OTC does not work states she's tried humidifier it don't help either.    Victoria Deleon presents for an acute visit today endorsing a 73-month history of intermittent sinus congestion and fatigue.  She has developed a dry cough within the last few weeks and states that she had the flu last month.  Symptoms have not improved with over-the-counter cough and cold medications.  She has tried using a humidifier that does not seem to help either.  Review of Systems  Constitutional:  Positive for malaise/fatigue.  HENT:  Positive for congestion.       Objective:    BP 126/81 (BP Location: Right Arm, Patient Position: Sitting, Cuff Size: Large)   Pulse 81   Ht 5\' 1"  (1.549 m)   Wt 146 lb 12.8 oz (66.6 kg)   SpO2 92%   BMI 27.74 kg/m  BP Readings from Last 3 Encounters:  08/31/23 120/70  08/15/23 126/81  08/15/23 137/84   Physical Exam Vitals reviewed.  Constitutional:      General: She is not in acute distress.    Appearance: Normal appearance. She is not toxic-appearing.  HENT:     Head: Normocephalic and atraumatic.     Right Ear: External ear normal.     Left Ear: External ear normal.     Nose: Congestion present. No rhinorrhea.     Mouth/Throat:     Mouth: Mucous membranes are moist.     Pharynx: Oropharynx is clear. No oropharyngeal exudate or posterior oropharyngeal erythema.  Eyes:     General: No scleral icterus.    Extraocular Movements: Extraocular movements intact.     Conjunctiva/sclera: Conjunctivae normal.     Pupils: Pupils are equal, round, and reactive to light.  Cardiovascular:     Rate and Rhythm: Normal rate and regular rhythm.     Pulses: Normal pulses.     Heart sounds: Normal  heart sounds. No murmur heard.    No friction rub. No gallop.  Pulmonary:     Effort: Pulmonary effort is normal.     Breath sounds: Normal breath sounds. No wheezing, rhonchi or rales.  Abdominal:     General: Abdomen is flat. Bowel sounds are normal. There is no distension.     Palpations: Abdomen is soft.     Tenderness: There is no abdominal tenderness.  Musculoskeletal:        General: No swelling. Normal range of motion.     Cervical back: Normal range of motion.     Right lower leg: No edema.     Left lower leg: No edema.  Lymphadenopathy:     Cervical: No cervical adenopathy.  Skin:    General: Skin is warm and dry.     Capillary Refill: Capillary refill takes less than 2 seconds.     Coloration: Skin is not jaundiced.  Neurological:     General: No focal deficit present.     Mental Status: She is alert and oriented to person, place, and time.  Psychiatric:        Mood and Affect: Mood normal.        Behavior: Behavior normal.  Assessment & Plan:   Problem List Items Addressed This Visit       Recurrent sinusitis   Presenting today for an acute visit endorsing a 75-month history of recurrent sinus congestion.  As noted above, she is recovering from the flu last month and has recently developed a dry cough.  Low concern for viral URI or bacterial etiology. - Treatment options reviewed.  Recommend continued supportive care measures for now.  We reviewed using a nasal saline rinse daily followed by fluticasone nasal spray.  She was also encouraged to start using a daily antihistamine.  She will return to care if symptoms worsen or fail to improve.      Other fatigue   She endorses worsening fatigue in recent weeks.  Unclear etiology.  Has a known history of hypothyroidism.  Will update basic labs to rule out contributing metabolic etiologies.      Return in about 3 months (around 11/15/2023).  Tobi Fortes, MD

## 2023-08-15 NOTE — Patient Instructions (Addendum)
 Nice to see you again today  Stop over-the-counter Prilosec  Begin pantoprazole 40 mg daily best taken 30 minutes before Breakfast dispense 30 with 11 refills  Stop Linzess 290; begin Linzess 145 take 1 gelcap daily.  Take every day.  Dispense 30 with 11 refills  CRP today  Stop smoking marijuana altogether  As discussed, with your weight loss and pain with eating may need to consider evaluating blood flow to your intestines   Your follow-up appointment here in 6 weeks

## 2023-08-15 NOTE — Progress Notes (Signed)
 Primary Care Physician:  Billie Lade, MD Primary Gastroenterologist:  Dr. Jena Gauss  Pre-Procedure History & Physical: HPI:  Victoria Deleon is a 58 y.o. female here after a long absence to further evaluate postprandial abdominal pain and intermittent nausea and vomiting.  Long-term smoker.  Smokes marijuana regularly.  Said to be diagnosed with Crohn's disease back in [place] but recent ileocolonoscopy demonstrated an entirely normal-appearing TI.  On no meds for that entity.  Severe reflux esophagitis found on prior EGD.  Recent Chem-12 CBC look good  She has not had any melena or rectal bleeding she has lost 30 pounds 2 years.  Very particular about what she eats.  She is a vegetarian.  Still she is constipated take a single Linzess 290 then she has diarrhea.  She does not take another Linzess she will have a bowel movement for a week.  Moved to [place] take care of her 5 young grandchildren.  She is stressed out all the time.  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 Jena Gauss: Normal terminal ileum with negative biopsies,  normal-appearing colon with random colon biopsies negative, internal hemorrhoids.  Next colonoscopy 10 years.   ESOPHAGOGASTRODUODENOSCOPY (EGD) WITH PROPOFOL N/A 04/24/2018   Dr. Jena Gauss: Severe ulcerative reflux esophagitis, small hiatal hernia, small bowel biopsies negative for celiac.   EYE SURGERY N/A    Phreesia 03/04/2020   JOINT REPLACEMENT N/A    Phreesia 01/22/2020   MEDIAL PARTIAL KNEE REPLACEMENT Left    TOTAL KNEE REVISION Left 09/10/2019   Procedure: LEFT TOTAL KNEE REVISION;  Surgeon: Gean Birchwood, MD;  Location: [facility];  Service: Orthopedics;  Laterality: Left;    Prior to Admission medications   Medication Sig Start Date End Date Taking? Authorizing Provider  atorvastatin (LIPITOR) 40 MG tablet Take 1 tablet (40 mg total) by mouth daily. 03/25/23 08/15/23 Yes Hilty, Lisette Abu, MD  EPINEPHrine 0.3 mg/0.3 mL IJ SOAJ injection Inject 0.3 mg into the muscle as needed for anaphylaxis. 08/27/20  Yes Anabel Halon, MD  FIBER PO Take 1 capsule by mouth 2 (two) times daily.    Yes [provider]  levothyroxine (SYNTHROID) 125 MCG tablet Take 1 tablet (125 mcg total) by mouth daily. 07/19/23  Yes Billie Lade, MD  omeprazole (PRILOSEC) 20 MG capsule Take 20 mg by mouth daily. 03/17/22  Yes [provider]  traZODone (DESYREL) 100 MG tablet Take a full tablet nightly before bed.  You can take half a tablet as needed during the night. 03/07/23  Yes Elsie Lincoln, MD    Allergies as of 08/15/2023 - Review Complete 08/15/2023  Allergen Reaction Noted   Codeine Anaphylaxis 06/02/2017    Family History  Adopted: Yes  Problem Relation Age of Onset   Colon cancer Neg Hx     Social History   Socioeconomic History   Marital status: Married    Spouse name: Peyton Najjar    Number of children: Not on file   Years of education: Not on file   Highest education level: Not on file  Occupational History    Comment: medical disability   Tobacco Use   Smoking status:  Every Day    Current packs/day: 1.00    Average packs/day: 1 pack/day for 35.2 years (35.2 ttl pk-yrs)    Types: Cigarettes    Start date: 3   Smokeless tobacco: Never  Vaping Use   Vaping status: Never Used  Substance and Sexual Activity   Alcohol use: Yes    Comment: rare-maybe 2 glasses of wine/year   Drug use: Not Currently    Types: Marijuana    Comment: smoked marijuana starting at age 65 in [place]. Stopped use in 2020 after moving to [place]   Sexual activity: Not Currently    Birth control/protection: Surgical    Comment: hyst  Other Topics Concern   Not on file  Social History Narrative   Lives with husband Peyton Najjar          Enjoys: kayaking, outside events       Diet: eats all food groups outside meat    Caffeine: tea 2 cups daily    Water: 5-6 cups daily or more      Wears seat belt   Does not use phone while driving    [employer] features editor safe area   Social Drivers of Health   Financial Resource Strain: Medium Risk (06/22/2023)   Overall Financial Resource Strain (CARDIA)    Difficulty of Paying Living Expenses: Somewhat hard  Food Insecurity: Patient Declined (06/22/2023)   Hunger Vital Sign    Worried About Running Out of Food in the Last Year: Patient declined    Ran Out of Food in the Last Year: Patient declined  Transportation Needs: No Transportation Needs (06/22/2023)   PRAPARE - Administrator, Civil Service (Medical): No    Lack of Transportation (Non-Medical): No  Physical Activity: Insufficiently Active (06/22/2023)   Exercise Vital Sign    Days of Exercise per Week: 3 days    Minutes of Exercise per Session: 30 min  Stress: Stress Concern Present (06/22/2023)   [employer] of Occupational Health - Occupational Stress Questionnaire    Feeling of Stress : To some extent  Social Connections: Unknown (06/22/2023)   Social Connection and Isolation Panel [NHANES]    Frequency of Communication with Friends and  Family: Patient declined    Frequency of Social Gatherings with Friends and Family: Patient declined    Attends Religious Services: Patient declined    Database administrator or Organizations: Patient declined    Attends Banker Meetings: Patient declined    Marital Status: Married  Catering manager Violence: Not At Risk (06/22/2023)   Humiliation, Afraid, Rape, and Kick questionnaire    Fear of Current or Ex-Partner: No    Emotionally Abused: No    Physically Abused: No    Sexually Abused: No    Review of Systems: See HPI, otherwise negative ROS  Physical Exam: BP 137/84 (BP Location: Right Arm, Patient Position: Sitting, Cuff Size: Normal)   Pulse 60   Temp 98 F (36.7 C) (Oral)   Ht 5\' 1"  (1.549 m)   Wt 146 lb 9.6 oz (66.5 kg)   SpO2 96%   BMI 27.70 kg/m  General:   Alert,  Well-developed, well-nourished, pleasant and cooperative in NAD Lungs:  Clear throughout to auscultation.   No wheezes, crackles, or rhonchi. No acute distress. Heart:  Regular rate and rhythm; no murmurs, clicks, rubs,  or gallops. Abdomen: Nondistended positive bowel sounds soft and nontender without appreciable mass organomegaly.  Impression/Plan: Pleasant 58 year old lady with complicated GERD/ulcerative reflux esophagitis seen on prior EGD on OTC PPI dosing on an as-needed basis.  Postprandial abdominal pain chronic constipation.  Takes Linzess incorrectly.  290 daily may well be too much for this nice lady.  Weight loss.  Smoker.  Ongoing cannabis use.  Gallbladder is out.  Reported history of Crohn's disease but no objective evidence with her evaluation here previously.  I doubt the diagnosis.  Poorly controlled GERD and constipation may be the main issue here.  Cannabis may be complicating the clinical picture.  She is under quite a bit of long-term situational stress.  With weight loss, however, need to keep in mind the possibility of mesenteric  ischemia  Recommendations:   Stop over-the-counter Prilosec  Begin pantoprazole 40 mg daily best taken 30 minutes before Breakfast dispense 30 with 11 refills  Stop Linzess 290; begin Linzess 145 take 1 gelcap daily.  Take every day.  Dispense 30 with 11 refills  CRP today  Stop smoking marijuana altogether  Consider evaluation of mesenteric vasculature depending on clinical progress.  follow-up appointment here in 6 weeks   Notice: This dictation was prepared with Dragon dictation along with smaller phrase technology. Any transcriptional errors that result from this process are unintentional and may not be corrected upon review.

## 2023-08-15 NOTE — Assessment & Plan Note (Signed)
 She continues to smoke cigarettes but has cut back to 2 per day.  She was congratulated on her progress and complete cessation was once again encouraged.

## 2023-08-15 NOTE — Assessment & Plan Note (Signed)
 Her acute concern today is multiple GI symptoms that have developed since her last appointment.  She endorses early satiety, irregular bowel habits, nausea with vomiting, and intermittent nonbloody diarrhea.  I recommended increasing levothyroxine to 137 mcg daily in light of lab results from her previous appointment.  While I do not think this explains her symptoms entirely, I have asked her to reduce levothyroxine back to 125 mcg daily in case this is contributing.  She is currently prescribed omeprazole 20 mg daily, which adequately controls reflux symptoms.  There have been no additional medication changes.  Based on her current symptoms, a referral to gastroenterology has been placed.

## 2023-08-15 NOTE — Assessment & Plan Note (Signed)
 Reflux symptoms remain well-controlled with omeprazole 20 mg daily.

## 2023-08-15 NOTE — Patient Instructions (Signed)
 It was a pleasure to see you today.  Thank you for giving [place] the opportunity to be involved in your care.  Below is a brief recap of your visit and next steps.  We will plan to see you again in 3 months.  Summary I recommend using a nasal saline rinse followed by fluticasone nasal spray. Continue use of a daily antihistamine Will check thyroid studies and update additional labs to check for other reasons to explain your fatigue. Follow up in 3 months

## 2023-08-16 ENCOUNTER — Other Ambulatory Visit: Payer: Self-pay | Admitting: Internal Medicine

## 2023-08-16 ENCOUNTER — Encounter: Payer: Self-pay | Admitting: Internal Medicine

## 2023-08-16 DIAGNOSIS — E063 Autoimmune thyroiditis: Secondary | ICD-10-CM

## 2023-08-16 LAB — CBC WITH DIFFERENTIAL/PLATELET
Basophils Absolute: 0.1 10*3/uL (ref 0.0–0.2)
Basos: 1 %
EOS (ABSOLUTE): 0.2 10*3/uL (ref 0.0–0.4)
Eos: 3 %
Hematocrit: 43.3 % (ref 34.0–46.6)
Hemoglobin: 14.9 g/dL (ref 11.1–15.9)
Immature Grans (Abs): 0 10*3/uL (ref 0.0–0.1)
Immature Granulocytes: 0 %
Lymphocytes Absolute: 1.5 10*3/uL (ref 0.7–3.1)
Lymphs: 27 %
MCH: 31.6 pg (ref 26.6–33.0)
MCHC: 34.4 g/dL (ref 31.5–35.7)
MCV: 92 fL (ref 79–97)
Monocytes Absolute: 0.5 10*3/uL (ref 0.1–0.9)
Monocytes: 10 %
Neutrophils Absolute: 3.1 10*3/uL (ref 1.4–7.0)
Neutrophils: 59 %
Platelets: 320 10*3/uL (ref 150–450)
RBC: 4.71 x10E6/uL (ref 3.77–5.28)
RDW: 13.6 % (ref 11.7–15.4)
WBC: 5.3 10*3/uL (ref 3.4–10.8)

## 2023-08-16 LAB — CMP14+EGFR
ALT: 10 IU/L (ref 0–32)
AST: 13 IU/L (ref 0–40)
Albumin: 4.4 g/dL (ref 3.8–4.9)
Alkaline Phosphatase: 76 IU/L (ref 44–121)
BUN/Creatinine Ratio: 10 (ref 9–23)
BUN: 8 mg/dL (ref 6–24)
Bilirubin Total: 0.3 mg/dL (ref 0.0–1.2)
CO2: 23 mmol/L (ref 20–29)
Calcium: 9.4 mg/dL (ref 8.7–10.2)
Chloride: 101 mmol/L (ref 96–106)
Creatinine, Ser: 0.78 mg/dL (ref 0.57–1.00)
Globulin, Total: 2.1 g/dL (ref 1.5–4.5)
Glucose: 134 mg/dL — ABNORMAL HIGH (ref 70–99)
Potassium: 4.4 mmol/L (ref 3.5–5.2)
Sodium: 140 mmol/L (ref 134–144)
Total Protein: 6.5 g/dL (ref 6.0–8.5)
eGFR: 88 mL/min/{1.73_m2} (ref >59)

## 2023-08-16 LAB — IRON,TIBC AND FERRITIN PANEL
Ferritin: 82 ng/mL (ref 15–150)
Iron Saturation: 28 % (ref 15–55)
Iron: 89 ug/dL (ref 27–159)
Total Iron Binding Capacity: 314 ug/dL (ref 250–450)
UIBC: 225 ug/dL (ref 131–425)

## 2023-08-16 LAB — TSH+FREE T4
Free T4: 1.11 ng/dL (ref 0.82–1.77)
TSH: 11.9 u[IU]/mL — ABNORMAL HIGH (ref 0.450–4.500)

## 2023-08-16 MED ORDER — LEVOTHYROXINE SODIUM 137 MCG PO TABS
137.0000 ug | ORAL_TABLET | Freq: Every day | ORAL | 3 refills | Status: DC
Start: 2023-08-16 — End: 2023-11-09

## 2023-08-28 NOTE — Progress Notes (Unsigned)
 Cardiology Office Note:  .   Date:  08/31/2023  ID:  Victoria Deleon, DOB 01/19/1966, MRN [id number] PCP: Billie Lade, MD  [facility] Providers Cardiologist:  Orbie Pyo, MD    Patient Profile: .      PMH Elevated LP(a) Coronary artery disease Coronary CTA 09/2021 CAC score 11 (81st percentile) Nonobstructive CAD with calcified plaque in prox LAD (0-24%) stenosis Dyslipidemia Echocardiogram 10/08/2021 Normal LVEF 55-60, no rwma, normal RV, mild calcification of aortic valve with no stenosis Normal ABIs on vascular testing 10/14/2021 GAD PTSD Adopted - does not know family history of heart disease   Referred to advanced lipid disorder clinic and seen by Dr. Rennis Golden 03/01/2023 for elevated LP(a).  She had seen Dr. Lynnette Caffey for chest discomfort and underwent coronary CT angiography May 2023 which demonstrated no obstructive CAD with minimal plaque in proximal LAD, calcium score of 11, 81st percentile.  She had significantly elevated lipid profile with LDL of 251.  She was started on atorvastatin 80 mg daily with improvement and total cholesterol 152, triglycerides 118, HDL 55, and LDL 76.  However subsequently she had been working with a provider in [place] who stopped all of her medications in order to "wash out her system."  The plan was to reestablish some medications as needed based on repeat testing.  She reported feeling significantly better since coming off some of the medications.  LP(a) was significantly elevated at 368.7 nmol/L.  The upcoming clinical trial for primary prevention of LP(a) lowering with a specific inhibitor was discussed and she voiced interest. She was scheduled to undergo repeat testing about 1 week later with provider in [place].  She called later to request Dr. Rennis Golden to order labs.  NMR completed 03/15/2023 revealed LDL particle number 2599, LDL-C 237, HDL-C 54, triglycerides 145, total cholesterol 318, small LDL-P 794. She was advised to resume  atorvastatin 40 mg daily and get repeat lipid panel in 3 months. Plan to consider adding Repatha if LDL not at goal.        History of Present Illness: .   Victoria Deleon is a very pleasant 58 y.o. female who is here today for follow-up of dyslipidemia. She reports she is feeling well but is concerned about the effect her elevated cholesterol could have on her heart. She reports compliance with atorvastatin despite participating in a medication washout trial. She follows a pescatarian diet and an active lifestyle, which includes swimming three days a week, home exercises, and gardening. She helps care for 5 young grandchildren as well, is on her feet for 12 plus hours a day. Reports fluctuations in her thyroid levels, requiring changes in medication like a "roller coaster."  No concerning cardiac symptoms such as chest pain, shortness of breath, or palpitations, but does note experiencing stress and anxiety.   Diet (pescatarian) Veggie shake Salad or tuna or salmon White fish and vegetables Fruits or veggies for snacks Tea Very limited sugar   Activity Swims at [employer] or at home 3 days per week  45 minutes home exercises for strength training and stretching due to back pain Raising 5 grandchildren, gardening, house and yard work   Discussed the use of AI scribe software for clinical note transcription with the patient, who gave verbal consent to proceed.   ROS: See HPI       Studies Reviewed: Marland Kitchen   EKG Interpretation Date/Time:  Wednesday August 31 2023 15:53:48 EDT Ventricular Rate:  63 PR Interval:  130 QRS  Duration:  92 QT Interval:  400 QTC Calculation: 409 R Axis:   18  Text Interpretation: Normal sinus rhythm Normal ECG When compared with ECG of 30-Aug-2019 09:46, No significant change was found Confirmed by Eligha Bridegroom [phone]) on 08/31/2023 4:04:34 PM     Lipoprotein (a)  Date/Time Value Ref Range Status  04/07/2022 10:27 AM 368.7 (H) <75.0 nmol/L Final    Comment:     **Results verified by repeat testing** Note:  Values greater than or equal to 75.0 nmol/L may        indicate an independent risk factor for CHD,        but must be evaluated with caution when applied        to non-Caucasian populations due to the        influence of genetic factors on Lp(a) across        ethnicities.      Risk Assessment/Calculations:             Physical Exam:   VS:  BP 120/70 (BP Location: Right Arm, Patient Position: Sitting, Cuff Size: Small)   Pulse 66   Ht 5\' 1"  (1.549 m)   Wt 143 lb 3.2 oz (65 kg)   BMI 27.06 kg/m    Wt Readings from Last 3 Encounters:  08/31/23 143 lb 3.2 oz (65 kg)  08/15/23 146 lb 12.8 oz (66.6 kg)  08/15/23 146 lb 9.6 oz (66.5 kg)    GEN: Well nourished, well developed in no acute distress NECK: No JVD; No carotid bruits CARDIAC: RRR, no murmurs, rubs, gallops RESPIRATORY:  Clear to auscultation without rales, wheezing or rhonchi  ABDOMEN: Soft, non-tender, non-distended EXTREMITIES:  No edema; No deformity     ASSESSMENT AND PLAN: .     Dyslipidemia LDL goal < 70/Possible FH: History of significantly elevated LDL which improved to 80 on atorvastatin previously. LDL spiked again at 237 on most recent lipid panel 03/15/2023 at which time she had stopped atorvastatin but not due to intolerance. She was advised to resume atorvastatin. We need repeat lipid panel to evaluate its effectiveness.  Possible familial hyperlipidemia, she does not know family history because she is adopted.  She also has elevated LP(a).  We will get repeat lipid panel in the next few days when she can return fasting. She already eats a healthy diet and remains very physically active. We discussed potentially adding PCSK9 inhibitor or Leqvio if LDL remains above goal.  Liver function normal on labs completed 08/15/2023.  CAD: Coronary CT 10/07/2021 with minimal nonobstructive CAD (0 to 24%) and CAC score of 11 (81st percentile). EKG today reveals NSR with no  concerning ST Abnormality. She denies chest pain, dyspnea, or other symptoms concerning for angina.  No indication for further ischemic evaluation at this time.   Elevated LP(a): 368.7 on 04/07/2022.  Thankfully, no significant coronary artery disease on CT 09/2021.  Consider adding PCSK9i or siRNA if LDL remains elevated as noted above.       Disposition:6 months with Dr. Rennis Golden or me  Signed, Eligha Bridegroom, NP-C

## 2023-08-29 ENCOUNTER — Telehealth (HOSPITAL_COMMUNITY): Admitting: Psychiatry

## 2023-08-29 ENCOUNTER — Encounter (HOSPITAL_COMMUNITY): Payer: Self-pay | Admitting: Psychiatry

## 2023-08-29 DIAGNOSIS — F4310 Post-traumatic stress disorder, unspecified: Secondary | ICD-10-CM | POA: Diagnosis not present

## 2023-08-29 DIAGNOSIS — G4709 Other insomnia: Secondary | ICD-10-CM | POA: Diagnosis not present

## 2023-08-29 DIAGNOSIS — F4000 Agoraphobia, unspecified: Secondary | ICD-10-CM | POA: Diagnosis not present

## 2023-08-29 DIAGNOSIS — F331 Major depressive disorder, recurrent, moderate: Secondary | ICD-10-CM | POA: Diagnosis not present

## 2023-08-29 DIAGNOSIS — F411 Generalized anxiety disorder: Secondary | ICD-10-CM | POA: Diagnosis not present

## 2023-08-29 DIAGNOSIS — F410 Panic disorder [episodic paroxysmal anxiety] without agoraphobia: Secondary | ICD-10-CM

## 2023-08-29 DIAGNOSIS — F17200 Nicotine dependence, unspecified, uncomplicated: Secondary | ICD-10-CM | POA: Diagnosis not present

## 2023-08-29 MED ORDER — TRAZODONE HCL 100 MG PO TABS: ORAL_TABLET | ORAL | 0 refills | Status: DC

## 2023-08-29 MED ORDER — PROPRANOLOL HCL 10 MG PO TABS: 10.0000 mg | ORAL_TABLET | Freq: Two times a day (BID) | ORAL | 2 refills | Status: DC | PRN

## 2023-08-29 MED ORDER — DULOXETINE HCL 60 MG PO CPEP: 120.0000 mg | ORAL_CAPSULE | Freq: Every day | ORAL | 2 refills | Status: DC

## 2023-08-29 NOTE — Patient Instructions (Addendum)
 We added propranolol 10 mg twice daily as needed for anxiety/panic today.  This should help bring down the stress sensation to more manageable levels and if you need to take it twice daily scheduled that is okay.  If ulcers are present trying to cut back on smoking is something that can help as well.
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
## 2023-08-31 ENCOUNTER — Encounter (HOSPITAL_BASED_OUTPATIENT_CLINIC_OR_DEPARTMENT_OTHER): Payer: Self-pay | Admitting: Nurse Practitioner

## 2023-08-31 ENCOUNTER — Ambulatory Visit (HOSPITAL_BASED_OUTPATIENT_CLINIC_OR_DEPARTMENT_OTHER): Payer: Medicare HMO | Admitting: Nurse Practitioner

## 2023-08-31 VITALS — BP 120/70 | HR 66 | Ht 61.0 in | Wt 143.2 lb

## 2023-08-31 DIAGNOSIS — I700 Atherosclerosis of aorta: Secondary | ICD-10-CM

## 2023-08-31 DIAGNOSIS — E785 Hyperlipidemia, unspecified: Secondary | ICD-10-CM

## 2023-08-31 DIAGNOSIS — I10 Essential (primary) hypertension: Secondary | ICD-10-CM | POA: Diagnosis not present

## 2023-08-31 DIAGNOSIS — E7841 Elevated Lipoprotein(a): Secondary | ICD-10-CM | POA: Diagnosis not present

## 2023-08-31 DIAGNOSIS — I2510 Atherosclerotic heart disease of native coronary artery without angina pectoris: Secondary | ICD-10-CM

## 2023-08-31 NOTE — Patient Instructions (Signed)
 Medication Instructions:   Your physician recommends that you continue on your current medications as directed. Please refer to the Current Medication list given to you today.   *If you need a refill on your cardiac medications before your next appointment, please call your pharmacy*  Lab Work:  Your physician recommends that you return for a FASTING NMR, fasting after midnight at your earliest convenience. Pt given paperwork.    If you have labs (blood work) drawn today and your tests are completely normal, you will receive your results only by: MyChart Message (if you have MyChart) OR A paper copy in the mail If you have any lab test that is abnormal or we need to change your treatment, we will call you to review the results.  Testing/Procedures:  None ordered.  Follow-Up: At [facility], you and your health needs are our priority.  As part of our continuing mission to provide you with exceptional heart care, our providers are all part of one team.  This team includes your primary Cardiologist (physician) and Advanced Practice Providers or APPs (Physician Assistants and Nurse Practitioners) who all work together to provide you with the care you need, when you need it.  Your next appointment:   6 month(s)  Provider:   K. Italy Hilty, MD or Eligha Bridegroom, NP    We recommend signing up for the patient portal called "MyChart".  Sign up information is provided on this After Visit Summary.  MyChart is used to connect with patients for Virtual Visits (Telemedicine).  Patients are able to view lab/test results, encounter notes, upcoming appointments, etc.  Non-urgent messages can be sent to your provider as well.   To learn more about what you can do with MyChart, go to [email or web address].   Other Instructions  Your physician wants you to follow-up in: 6 months.  You will receive a reminder letter in the mail two months in advance. If you don't receive a letter, please  call our office to schedule the follow-up appointment.

## 2023-09-06 DIAGNOSIS — I10 Essential (primary) hypertension: Secondary | ICD-10-CM | POA: Diagnosis not present

## 2023-09-06 DIAGNOSIS — E785 Hyperlipidemia, unspecified: Secondary | ICD-10-CM | POA: Diagnosis not present

## 2023-09-06 DIAGNOSIS — E7841 Elevated Lipoprotein(a): Secondary | ICD-10-CM | POA: Diagnosis not present

## 2023-09-06 DIAGNOSIS — I700 Atherosclerosis of aorta: Secondary | ICD-10-CM | POA: Diagnosis not present

## 2023-09-06 DIAGNOSIS — I2510 Atherosclerotic heart disease of native coronary artery without angina pectoris: Secondary | ICD-10-CM | POA: Diagnosis not present

## 2023-09-07 LAB — NMR, LIPOPROFILE
Cholesterol, Total: 247 mg/dL — ABNORMAL HIGH (ref 100–199)
HDL Particle Number: 33.4 umol/L (ref >=30.5)
HDL-C: 59 mg/dL (ref >39)
LDL Particle Number: 1528 nmol/L — ABNORMAL HIGH (ref <1000)
LDL Size: 22.1 nm (ref >20.5)
LDL-C (NIH Calc): 165 mg/dL — ABNORMAL HIGH (ref 0–99)
LP-IR Score: 37 (ref <=45)
Small LDL Particle Number: 185 nmol/L (ref <=527)
Triglycerides: 128 mg/dL (ref 0–149)

## 2023-09-08 ENCOUNTER — Other Ambulatory Visit (HOSPITAL_COMMUNITY): Payer: Self-pay

## 2023-09-08 ENCOUNTER — Telehealth: Payer: Self-pay | Admitting: Nurse Practitioner

## 2023-09-08 ENCOUNTER — Encounter (HOSPITAL_BASED_OUTPATIENT_CLINIC_OR_DEPARTMENT_OTHER): Payer: Self-pay

## 2023-09-08 ENCOUNTER — Telehealth: Payer: Self-pay | Admitting: Pharmacy Technician

## 2023-09-08 DIAGNOSIS — E785 Hyperlipidemia, unspecified: Secondary | ICD-10-CM

## 2023-09-08 DIAGNOSIS — Z5181 Encounter for therapeutic drug level monitoring: Secondary | ICD-10-CM

## 2023-09-08 MED ORDER — REPATHA SURECLICK 140 MG/ML ~~LOC~~ SOAJ: 140.0000 mg | SUBCUTANEOUS | 3 refills | Status: DC

## 2023-09-08 NOTE — Telephone Encounter (Signed)
 Pt returning call to a nurse for lab results

## 2023-09-08 NOTE — Telephone Encounter (Signed)
 Pharmacy Patient Advocate Encounter  Received notification from [facility] that Prior Authorization for Repatha has been APPROVED from 06/01/23 to 05/30/24  Test claim: 2ml for 28 days   $497.00- one month (deductible) PA #/Case ID/Reference #: [id number]

## 2023-09-08 NOTE — Telephone Encounter (Signed)
 Pharmacy Patient Advocate Encounter   Received notification from CoverMyMeds that prior authorization for Repatha is required/requested.   Insurance verification completed.   The patient is insured through [employer] .   Per test claim: PA required; PA submitted to above mentioned insurance via CoverMyMeds Key/confirmation #/EOC [id number] Status is pending

## 2023-09-08 NOTE — Telephone Encounter (Signed)
 Left message for patient to call back

## 2023-09-08 NOTE — Addendum Note (Signed)
 Addended by: Regis Bill B on: 09/08/2023 02:05 PM   Modules accepted: Orders

## 2023-09-08 NOTE — Telephone Encounter (Signed)
 Called and spoke to pt. Discussed lab results and MyChart message of Repatha and instructions. Pt will look at info and call back with any further questions. She verbalized understanding.

## 2023-09-09 ENCOUNTER — Telehealth: Payer: Self-pay | Admitting: Pharmacy Technician

## 2023-09-09 ENCOUNTER — Other Ambulatory Visit (HOSPITAL_COMMUNITY): Payer: Self-pay

## 2023-09-09 NOTE — Telephone Encounter (Signed)
 PA team messaged pt of healthwell grant approval. See encounter 4/11.

## 2023-09-09 NOTE — Telephone Encounter (Signed)
 Patient Advocate Encounter   The patient was approved for a [employer] grant that will help cover the cost of Repatha Total amount awarded, 2500.00.  Effective: 08/10/23 - 08/08/24   ZOX:[id number] WUJ:[id number] WGNFA:[id number] QI:[id number]  [employer]well ID: [id number]   Pharmacy provided with approval and processing information. Patient informed via mychart

## 2023-09-23 DIAGNOSIS — R5383 Other fatigue: Secondary | ICD-10-CM | POA: Insufficient documentation

## 2023-09-23 DIAGNOSIS — J329 Chronic sinusitis, unspecified: Secondary | ICD-10-CM | POA: Insufficient documentation

## 2023-09-23 NOTE — Assessment & Plan Note (Signed)
 She endorses worsening fatigue in recent weeks.  Unclear etiology.  Has a known history of hypothyroidism.  Will update basic labs to rule out contributing metabolic etiologies.

## 2023-09-23 NOTE — Assessment & Plan Note (Signed)
 Presenting today for an acute visit endorsing a 89-month history of recurrent sinus congestion.  As noted above, she is recovering from the flu last month and has recently developed a dry cough.  Low concern for viral URI or bacterial etiology. - Treatment options reviewed.  Recommend continued supportive care measures for now.  We reviewed using a nasal saline rinse daily followed by fluticasone nasal spray.  She was also encouraged to start using a daily antihistamine.  She will return to care if symptoms worsen or fail to improve.

## 2023-09-26 ENCOUNTER — Encounter: Payer: Self-pay | Admitting: Internal Medicine

## 2023-09-26 ENCOUNTER — Other Ambulatory Visit: Payer: Self-pay | Admitting: *Deleted

## 2023-09-26 ENCOUNTER — Encounter: Payer: Self-pay | Admitting: *Deleted

## 2023-09-26 ENCOUNTER — Ambulatory Visit (INDEPENDENT_AMBULATORY_CARE_PROVIDER_SITE_OTHER): Admitting: Internal Medicine

## 2023-09-26 ENCOUNTER — Telehealth: Payer: Self-pay | Admitting: *Deleted

## 2023-09-26 VITALS — BP 123/79 | HR 58 | Temp 97.8°F | Ht 61.0 in | Wt 146.2 lb

## 2023-09-26 DIAGNOSIS — R140 Abdominal distension (gaseous): Secondary | ICD-10-CM

## 2023-09-26 DIAGNOSIS — K2100 Gastro-esophageal reflux disease with esophagitis, without bleeding: Secondary | ICD-10-CM

## 2023-09-26 DIAGNOSIS — K5909 Other constipation: Secondary | ICD-10-CM | POA: Diagnosis not present

## 2023-09-26 NOTE — Telephone Encounter (Signed)
 [facility] PA: Pending: In clinical review Tracking [phone]

## 2023-09-26 NOTE — Progress Notes (Unsigned)
 Primary Care Physician:  Tobi Fortes, MD Primary Gastroenterologist:  Dr. Riley Cheadle  Pre-Procedure History & Physical: HPI:  Victoria Deleon is a 58 y.o. female here for further evaluation of bloating constipation reflux  Bloated when she eats.  Has not lost any weight.  Ileocolonoscopy -2019.  Previously Linzess  290 daily too much 145 was working but not as well.  Reflux better on Protonix  40 mg daily.  Continues to have quite a bit of stress in her life trying to manage for her grandchildren in and out of court with custody issues recently.  Does have bloating and describes discomfort with eating.  She is a vegetarian.  She has lost 30 pounds in the past year but that is largely due to dietary change.  I cannot find CRP results in the chart.  Important labs reveal a recent normal CBC and iron studies.  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 Riley Cheadle: Normal terminal ileum with negative biopsies, normal-appearing colon with random colon biopsies negative, internal hemorrhoids.  Next colonoscopy 10 years.   ESOPHAGOGASTRODUODENOSCOPY (EGD)  WITH PROPOFOL  N/A 04/24/2018   Dr. Riley Cheadle: Severe ulcerative reflux esophagitis, small hiatal hernia, small bowel biopsies negative for celiac.   EYE SURGERY N/A    [employer]reesia 03/04/2020   JOINT REPLACEMENT N/A    [employer]ia 01/22/2020   MEDIAL PARTIAL KNEE REPLACEMENT Left    TOTAL KNEE REVISION Left 09/10/2019   Procedure: LEFT TOTAL KNEE REVISION;  Surgeon: Wendolyn Hamburger, MD;  Location: [facility];  Service: Orthopedics;  Laterality: Left;    Prior to Admission medications   Medication Sig Start Date End Date Taking? Authorizing Provider  atorvastatin  (LIPITOR) 40 MG tablet Take 1 tablet (40 mg total) by mouth daily. 03/25/23 09/26/23 Yes Hilty, Aviva Lemmings, MD  DULoxetine  (CYMBALTA ) 60 MG capsule Take 2 capsules (120 mg total) by mouth daily. 08/29/23 11/27/23 Yes Madie Schilling, MD  EPINEPHrine  0.3 mg/0.3 mL IJ SOAJ injection Inject 0.3 mg into the muscle as needed for anaphylaxis. 08/27/20  Yes Meldon Sport, MD  Evolocumab  (REPATHA  SURECLICK) 140 MG/ML SOAJ Inject 140 mg into the skin every 14 (fourteen) days. 09/08/23  Yes Swinyer, Leilani Punter, NP  FIBER PO Take 1 capsule by mouth 2 (two) times daily.    Yes [provider]  levothyroxine  (SYNTHROID ) 137 MCG tablet Take 1 tablet (137 mcg total) by mouth daily before breakfast. 08/16/23  Yes Tobi Fortes, MD  linaclotide  (LINZESS ) 145 MCG CAPS capsule Take 1 capsule (145 mcg total) by mouth daily  before breakfast. 08/15/23  Yes Jaylin Benzel, Windsor Hatcher, MD  pantoprazole  (PROTONIX ) 40 MG tablet Take 1 tablet (40 mg total) by mouth daily. 08/15/23  Yes Ceola Para, Windsor Hatcher, MD  propranolol  (INDERAL ) 10 MG tablet Take 1 tablet (10 mg total) by mouth 2 (two) times daily as needed (anxiety). 08/29/23  Yes Madie Schilling, MD  traZODone  (DESYREL ) 100 MG tablet Take a full tablet nightly before bed.  You can take half a tablet as needed during the night. 08/29/23  Yes Madie Schilling, MD    Allergies as of 09/26/2023 - Review Complete 09/26/2023  Allergen  Reaction Noted   Codeine Anaphylaxis 06/02/2017    Family History  Adopted: Yes  Problem Relation Age of Onset   Colon cancer Neg Hx     Social History   Socioeconomic History   Marital status: Married    Spouse name: Hewitt Lou    Number of children: Not on file   Years of education: Not on file   Highest education level: Not on file  Occupational History    Comment: medical disability   Tobacco Use   Smoking status: Every Day    Current packs/day: 1.00    Average packs/day: 1 pack/day for 35.3 years (35.3 ttl pk-yrs)    Types: Cigarettes    Start date: 58   Smokeless tobacco: Never  Vaping Use   Vaping status: Never Used  Substance and Sexual Activity   Alcohol use: Yes    Comment: rare-maybe 2 glasses of wine/year   Drug use: Not Currently    Types: Marijuana    Comment: smoked marijuana starting at age 46 in [place] . Stopped use in 2020 after moving to [place]   Sexual activity: Not Currently    Birth control/protection: Surgical    Comment: hyst  Other Topics Concern   Not on file  Social History Narrative   Lives with husband Hewitt Lou          Enjoys: kayaking, outside events       Diet: eats all food groups outside meat    Caffeine: tea 2 cups daily    Water : 5-6 cups daily or more      Wears seat belt   Does not use phone while driving    [employer] features editor safe area   Social Drivers of Health   Financial Resource Strain: Medium Risk (06/22/2023)   Overall Financial Resource Strain (CARDIA)    Difficulty of Paying Living Expenses: Somewhat hard  Food Insecurity: Patient Declined (06/22/2023)   Hunger Vital Sign    Worried About Running Out of Food in the Last Year: Patient declined    Ran Out of Food in the Last Year: Patient declined  Transportation Needs: No Transportation Needs (06/22/2023)   PRAPARE - Administrator, Civil Service (Medical): No    Lack of Transportation (Non-Medical): No  Physical Activity:  Insufficiently Active (06/22/2023)   Exercise Vital Sign    Days of Exercise per Week: 3 days    Minutes of Exercise per Session: 30 min  Stress: Stress Concern Present (06/22/2023)   [employer] of Occupational Health - Occupational Stress Questionnaire    Feeling of Stress : To some extent  Social Connections: Unknown (06/22/2023)   Social Connection and Isolation Panel [NHANES]    Frequency of Communication with Friends and Family: Patient declined    Frequency of Social Gatherings with Friends and Family: Patient declined  Attends Religious Services: Patient declined    Active Member of Clubs or Organizations: Patient declined    Attends Banker Meetings: Patient declined    Marital Status: Married  Catering manager Violence: Not At Risk (06/22/2023)   Humiliation, Afraid, Rape, and Kick questionnaire    Fear of Current or Ex-Partner: No    Emotionally Abused: No    Physically Abused: No    Sexually Abused: No    Review of Systems: See HPI, otherwise negative ROS  Physical Exam: BP 123/79 (BP Location: Right Arm, Patient Position: Sitting, Cuff Size: Normal)   Pulse (!) 58   Temp 97.8 F (36.6 C) (Oral)   Ht 5\' 1"  (1.549 m)   Wt 146 lb 3.2 oz (66.3 kg)   SpO2 99%   BMI 27.62 kg/m  General:   Alert,  Well-developed, well-nourished, pleasant and cooperative in NAD Abdomen: Non-distended, normal bowel sounds.  Soft and nontender without appreciable mass or hepatosplenomegaly.   Impression/Plan: 58 year old lady with GERD chronic bloating constipation.  Linzess  290 every day overshot her endpoint with diarrhea.  145 was working till recently.  States she has 3 days without a bowel movement has taken a laxative.  Fairly well-controlled on Protonix  40 mg daily.  She has not lost any weight although she does have discomfort after eating.  Possibly mesenteric ischemia less likely is in the differential as a contributing factor.  I cannot find a CRP result in  the chart.   We need to customize your Linzess  dosing.  Continue Linzess  145.  Alternate 1 gelcap with 2 gelcaps every other day  CTA abdomen to evaluate blood vessels going to your intestines  Continue Protonix  40 mg daily.  CRP blood work previously ordered.  Office visit here in 8 weeks.     Notice: This dictation was prepared with Dragon dictation along with smaller phrase technology. Any transcriptional errors that result from this process are unintentional and may not be corrected upon review.

## 2023-09-26 NOTE — Patient Instructions (Addendum)
 It was good to see you again today.  We need to customize your Linzess  dosing.  Continue Linzess  145.  Alternate 1 gelcap with 2 gelcaps every other day  CTA abdomen to evaluate blood vessels going to your intestines  Continue Protonix  40 mg daily.  CRP blood work previously ordered.  Office visit here in 8 weeks.

## 2023-09-26 NOTE — Telephone Encounter (Signed)
 [employer] PA:  Approved Authorization #[id number]  Tracking [id number] Dates of service 09/26/2023 - 11/25/2023

## 2023-09-27 ENCOUNTER — Encounter (HOSPITAL_COMMUNITY): Payer: Self-pay | Admitting: Psychiatry

## 2023-09-27 ENCOUNTER — Telehealth (HOSPITAL_COMMUNITY): Admitting: Psychiatry

## 2023-09-27 DIAGNOSIS — F17210 Nicotine dependence, cigarettes, uncomplicated: Secondary | ICD-10-CM

## 2023-09-27 DIAGNOSIS — F4310 Post-traumatic stress disorder, unspecified: Secondary | ICD-10-CM

## 2023-09-27 DIAGNOSIS — F17200 Nicotine dependence, unspecified, uncomplicated: Secondary | ICD-10-CM

## 2023-09-27 DIAGNOSIS — F331 Major depressive disorder, recurrent, moderate: Secondary | ICD-10-CM | POA: Diagnosis not present

## 2023-09-27 DIAGNOSIS — F410 Panic disorder [episodic paroxysmal anxiety] without agoraphobia: Secondary | ICD-10-CM | POA: Diagnosis not present

## 2023-09-27 DIAGNOSIS — F4000 Agoraphobia, unspecified: Secondary | ICD-10-CM

## 2023-09-27 DIAGNOSIS — G4709 Other insomnia: Secondary | ICD-10-CM

## 2023-09-27 DIAGNOSIS — F411 Generalized anxiety disorder: Secondary | ICD-10-CM | POA: Diagnosis not present

## 2023-09-27 LAB — C-REACTIVE PROTEIN: CRP: <1 mg/L (ref 0–10)

## 2023-09-27 MED ORDER — DULOXETINE HCL 60 MG PO CPEP: 120.0000 mg | ORAL_CAPSULE | Freq: Every day | ORAL | 5 refills | Status: DC

## 2023-09-27 MED ORDER — PROPRANOLOL HCL 10 MG PO TABS
10.0000 mg | ORAL_TABLET | Freq: Two times a day (BID) | ORAL | 5 refills | Status: AC | PRN
Start: 2023-09-27 — End: ?

## 2023-09-27 MED ORDER — TRAZODONE HCL 100 MG PO TABS: ORAL_TABLET | ORAL | 3 refills | Status: AC
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
## 2023-09-27 NOTE — Patient Instructions (Signed)
 We did not make any medication changes today.

## 2023-10-05 ENCOUNTER — Encounter: Payer: Self-pay | Admitting: Internal Medicine

## 2023-10-10 DIAGNOSIS — H5203 Hypermetropia, bilateral: Secondary | ICD-10-CM | POA: Diagnosis not present

## 2023-10-10 DIAGNOSIS — H52223 Regular astigmatism, bilateral: Secondary | ICD-10-CM | POA: Diagnosis not present

## 2023-10-10 DIAGNOSIS — H524 Presbyopia: Secondary | ICD-10-CM | POA: Diagnosis not present

## 2023-10-31 ENCOUNTER — Encounter: Payer: Self-pay | Admitting: Acute Care

## 2023-10-31 DIAGNOSIS — E785 Hyperlipidemia, unspecified: Secondary | ICD-10-CM | POA: Diagnosis not present

## 2023-10-31 DIAGNOSIS — Z5181 Encounter for therapeutic drug level monitoring: Secondary | ICD-10-CM | POA: Diagnosis not present

## 2023-11-01 LAB — NMR, LIPOPROFILE
Cholesterol, Total: 155 mg/dL (ref 100–199)
HDL Particle Number: 40.8 umol/L (ref >=30.5)
HDL-C: 80 mg/dL (ref >39)
LDL Particle Number: 360 nmol/L (ref <1000)
LDL Size: 21.7 nm (ref >20.5)
LDL-C (NIH Calc): 62 mg/dL (ref 0–99)
LP-IR Score: <25 (ref <=45)
Small LDL Particle Number: <90 nmol/L (ref <=527)
Triglycerides: 68 mg/dL (ref 0–149)

## 2023-11-02 ENCOUNTER — Ambulatory Visit (HOSPITAL_BASED_OUTPATIENT_CLINIC_OR_DEPARTMENT_OTHER): Payer: Self-pay | Admitting: Nurse Practitioner

## 2023-11-03 ENCOUNTER — Other Ambulatory Visit: Payer: Self-pay | Admitting: Medical Genetics

## 2023-11-03 ENCOUNTER — Ambulatory Visit: Admitting: Internal Medicine

## 2023-11-03 ENCOUNTER — Ambulatory Visit (HOSPITAL_COMMUNITY)
Admission: RE | Admit: 2023-11-03 | Discharge: 2023-11-03 | Disposition: A | Discharge status: Home / Self Care | Source: Ambulatory Visit | Attending: Internal Medicine | Admitting: Internal Medicine

## 2023-11-03 DIAGNOSIS — N281 Cyst of kidney, acquired: Secondary | ICD-10-CM | POA: Diagnosis not present

## 2023-11-03 DIAGNOSIS — K2100 Gastro-esophageal reflux disease with esophagitis, without bleeding: Secondary | ICD-10-CM | POA: Insufficient documentation

## 2023-11-03 DIAGNOSIS — Z90710 Acquired absence of both cervix and uterus: Secondary | ICD-10-CM | POA: Diagnosis not present

## 2023-11-03 MED ORDER — IOHEXOL 350 MG/ML SOLN
100.0000 mL | Freq: Once | INTRAVENOUS | Status: AC | PRN
  Administered 2023-11-03: 100 mL via INTRAVENOUS

## 2023-11-03 MED ORDER — IOHEXOL 300 MG/ML  SOLN
100.0000 mL | Freq: Once | INTRAMUSCULAR | Status: DC | PRN
Start: 2023-11-03 — End: 2023-11-03

## 2023-11-04 ENCOUNTER — Ambulatory Visit: Payer: Self-pay | Admitting: Internal Medicine

## 2023-11-08 ENCOUNTER — Encounter: Payer: Self-pay | Admitting: Internal Medicine

## 2023-11-08 ENCOUNTER — Ambulatory Visit (INDEPENDENT_AMBULATORY_CARE_PROVIDER_SITE_OTHER): Admitting: Internal Medicine

## 2023-11-08 VITALS — BP 113/73 | HR 64 | Ht 61.0 in | Wt 148.8 lb

## 2023-11-08 DIAGNOSIS — G4709 Other insomnia: Secondary | ICD-10-CM

## 2023-11-08 DIAGNOSIS — F410 Panic disorder [episodic paroxysmal anxiety] without agoraphobia: Secondary | ICD-10-CM

## 2023-11-08 DIAGNOSIS — F411 Generalized anxiety disorder: Secondary | ICD-10-CM

## 2023-11-08 DIAGNOSIS — K219 Gastro-esophageal reflux disease without esophagitis: Secondary | ICD-10-CM

## 2023-11-08 DIAGNOSIS — E063 Autoimmune thyroiditis: Secondary | ICD-10-CM

## 2023-11-08 NOTE — Assessment & Plan Note (Signed)
 TSH 11.9 on labs from March.  She is currently prescribed levothyroxine  137 mcg daily.  She continues to endorse fatigue.  Symptoms are essentially unchanged from previous appointments.  Repeat thyroid  studies ordered today.

## 2023-11-08 NOTE — Assessment & Plan Note (Signed)
 Trazodone  remains effective for management of insomnia

## 2023-11-08 NOTE — Assessment & Plan Note (Signed)
 Symptoms have improved with switching to pantoprazole .

## 2023-11-08 NOTE — Patient Instructions (Signed)
 It was a pleasure to see you today.  Thank you for giving us  the opportunity to be involved in your care.  Below is a brief recap of your visit and next steps.  We will plan to see you again in 3 months.  Summary No medications changes today Repeat thyroid  studies ordered Follow up in 3 months

## 2023-11-08 NOTE — Assessment & Plan Note (Signed)
 Followed by psychiatry.  Last seen in April for follow-up.  She is currently prescribed Cymbalta  120 mg daily.  Symptoms are stable currently.

## 2023-11-08 NOTE — Progress Notes (Signed)
 Established Patient Office Visit  Subjective   Patient ID: Victoria Deleon, female    DOB: Sep 15, 1965  Age: 58 y.o. MRN: [id number]  Chief Complaint  Patient presents with   Care Management    Follow up   Fall    Patient fell Thursday of last week, pain in the right thigh, bruised and feels like a pinched nerve   [name] returns to care today for routine follow-up.  She was last evaluated by me on 3/17 for an acute visit endorsing a 80-month history of recurrent sinus congestion.  Previously seen by me on 2/18 for routine follow-up repeat labs were ordered and a referral was placed to gastroenterology in the setting of early satiety, irregular bowel habits, nausea with vomiting, and intermittent diarrhea.  In the interim, she has been evaluated by gastroenterology, cardiology, and psychiatry.  Today she reports feeling fairly well.  She continues to endorse fatigue.  Symptoms are essentially unchanged from her previous appointments.  She additionally endorses right thigh and buttock pain after falling in her garden 5 days ago.  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 Riley Cheadle: Normal terminal ileum with negative biopsies, normal-appearing colon with random colon biopsies negative, internal hemorrhoids.  Next colonoscopy 10 years.   ESOPHAGOGASTRODUODENOSCOPY (EGD) WITH PROPOFOL  N/A 04/24/2018   Dr. Riley Cheadle: Severe ulcerative reflux esophagitis, small hiatal hernia, small bowel biopsies negative for celiac.   EYE SURGERY N/A    Phreesia 03/04/2020   JOINT REPLACEMENT N/A    Phreesia 01/22/2020   MEDIAL PARTIAL KNEE REPLACEMENT Left    TOTAL KNEE REVISION Left 09/10/2019   Procedure: LEFT TOTAL KNEE REVISION;  Surgeon: Wendolyn Hamburger, MD;  Location: [facility];  Service: Orthopedics;  Laterality: Left;   Social History   Tobacco Use   Smoking status: Every Day    Current packs/day: 1.00    Average packs/day: 1 pack/day for 35.4 years (35.4 ttl pk-yrs)    Types: Cigarettes    Start date: 65   Smokeless tobacco: Never  Vaping Use   Vaping status: Never Used  Substance Use Topics   Alcohol use: Yes    Comment: rare-maybe 2 glasses of wine/year   Drug use: Not Currently    Types: Marijuana    Comment: smoked marijuana starting at age 88 in [place] . Stopped use in 2020 after moving to [place]   Family History  Adopted: Yes  Problem Relation Age of Onset   Colon cancer Neg Hx    Allergies  Allergen Reactions   Codeine Anaphylaxis  Review of Systems  Constitutional:  Positive for malaise/fatigue.  Musculoskeletal:        Right thigh/buttock pain x 5 days  All other systems reviewed and are negative.     Objective:     BP 113/73   Pulse 64   Ht 5\' 1"  (1.549 m)   Wt 148 lb 12.8 oz (67.5 kg)   SpO2 95%   BMI 28.12 kg/m  BP Readings from Last 3 Encounters:  11/08/23 113/73  09/26/23 123/79  08/31/23 120/70   Physical Exam Vitals reviewed.  Constitutional:      General: She is not in acute distress.    Appearance: Normal appearance. She is not toxic-appearing.  HENT:     Head: Normocephalic and atraumatic.     Right  Ear: External ear normal.     Left Ear: External ear normal.     Nose: Nose normal. No congestion or rhinorrhea.     Mouth/Throat:     Mouth: Mucous membranes are moist.     Pharynx: Oropharynx is clear. No oropharyngeal exudate or posterior oropharyngeal erythema.  Eyes:     General: No scleral icterus.    Extraocular Movements: Extraocular movements intact.     Conjunctiva/sclera: Conjunctivae normal.     Pupils: Pupils are equal, round, and reactive to light.  Cardiovascular:     Rate and Rhythm: Normal rate and regular rhythm.     Pulses: Normal pulses.     Heart sounds: Normal heart sounds. No murmur heard.    No friction rub. No gallop.  Pulmonary:     Effort: Pulmonary effort is normal.     Breath sounds: Normal breath sounds. No wheezing, rhonchi or rales.  Abdominal:     General: Abdomen is flat. Bowel sounds are normal. There is no distension.     Palpations: Abdomen is soft.     Tenderness: There is no abdominal tenderness.  Musculoskeletal:        General: No swelling. Normal range of motion.     Cervical back: Normal range of motion.     Right lower leg: No edema.     Left lower leg: No edema.     Comments: Bruising noted on right lateral thigh  Lymphadenopathy:     Cervical: No cervical adenopathy.  Skin:    General: Skin is warm and dry.     Capillary Refill: Capillary refill takes less than 2 seconds.     Coloration: Skin is not jaundiced.  Neurological:     General: No focal deficit present.     Mental Status: She is alert and oriented to person, place, and time.  Psychiatric:        Mood and Affect: Mood normal.        Behavior: Behavior normal.   Last CBC Lab Results  Component Value Date   WBC 5.3 08/15/2023   HGB 14.9 08/15/2023   HCT 43.3 08/15/2023   MCV 92 08/15/2023   MCH 31.6 08/15/2023   RDW 13.6 08/15/2023   PLT 320 08/15/2023   Last metabolic panel Lab Results  Component Value Date   GLUCOSE 134 (H) 08/15/2023   NA 140 08/15/2023    K 4.4 08/15/2023   CL 101 08/15/2023   CO2 23 08/15/2023   BUN 8 08/15/2023   CREATININE 0.78 08/15/2023   EGFR 88 08/15/2023   CALCIUM  9.4 08/15/2023   PROT 6.5 08/15/2023   ALBUMIN 4.4 08/15/2023   LABGLOB 2.1 08/15/2023   AGRATIO 2.2 03/02/2021  BILITOT 0.3 08/15/2023   ALKPHOS 76 08/15/2023   AST 13 08/15/2023   ALT 10 08/15/2023   ANIONGAP 9 08/30/2019   Last lipids Lab Results  Component Value Date   CHOL 152 04/07/2022   HDL 55 04/07/2022   LDLCALC 76 04/07/2022   TRIG 118 04/07/2022   CHOLHDL 2.8 04/07/2022   Last hemoglobin A1c Lab Results  Component Value Date   HGBA1C 6.1 (H) 04/18/2023   Last thyroid  functions Lab Results  Component Value Date   TSH 11.900 (H) 08/15/2023   Last vitamin D  Lab Results  Component Value Date   VD25OH 49.8 04/18/2023   Last vitamin B12 and Folate Lab Results  Component Value Date   VITAMINB12 877 04/18/2023   FOLATE 14.7 04/18/2023   The 10-year ASCVD risk score (Arnett DK, et al., 2019) is: 5%    Assessment & Plan:   Problem List Items Addressed This Visit       GERD (gastroesophageal reflux disease)   Symptoms have improved with switching to pantoprazole .      Hypothyroidism - Primary   TSH 11.9 on labs from March.  She is currently prescribed levothyroxine  137 mcg daily.  She continues to endorse fatigue.  Symptoms are essentially unchanged from previous appointments.  Repeat thyroid  studies ordered today.      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  Last seen in April for follow-up.  She is currently prescribed Cymbalta  120 mg daily.  Symptoms are stable currently.      Other insomnia   Trazodone  remains effective for management of insomnia       Return in about 3 months (around 02/08/2024).    Tobi Fortes, MD

## 2023-11-09 ENCOUNTER — Ambulatory Visit: Payer: Self-pay | Admitting: Internal Medicine

## 2023-11-09 DIAGNOSIS — E063 Autoimmune thyroiditis: Secondary | ICD-10-CM

## 2023-11-09 LAB — TSH+FREE T4
Free T4: 0.73 ng/dL — ABNORMAL LOW (ref 0.82–1.77)
TSH: 35.2 u[IU]/mL — ABNORMAL HIGH (ref 0.450–4.500)

## 2023-11-09 MED ORDER — LEVOTHYROXINE SODIUM 150 MCG PO TABS: 150.0000 ug | ORAL_TABLET | Freq: Every day | ORAL | 3 refills | Status: DC

## 2023-11-24 ENCOUNTER — Other Ambulatory Visit

## 2023-11-24 DIAGNOSIS — Z006 Encounter for examination for normal comparison and control in clinical research program: Secondary | ICD-10-CM

## 2023-12-06 DIAGNOSIS — M545 Low back pain, unspecified: Secondary | ICD-10-CM | POA: Diagnosis not present

## 2023-12-13 LAB — GENECONNECT MOLECULAR SCREEN: Genetic Analysis Overall Interpretation: NEGATIVE

## 2023-12-20 DIAGNOSIS — M545 Low back pain, unspecified: Secondary | ICD-10-CM | POA: Diagnosis not present

## 2024-01-24 DIAGNOSIS — M5440 Lumbago with sciatica, unspecified side: Secondary | ICD-10-CM | POA: Diagnosis not present

## 2024-01-24 DIAGNOSIS — Z6829 Body mass index (BMI) 29.0-29.9, adult: Secondary | ICD-10-CM | POA: Diagnosis not present

## 2024-01-27 ENCOUNTER — Other Ambulatory Visit (HOSPITAL_COMMUNITY): Payer: Self-pay | Admitting: Neurosurgery

## 2024-01-27 DIAGNOSIS — M5440 Lumbago with sciatica, unspecified side: Secondary | ICD-10-CM

## 2024-02-02 ENCOUNTER — Telehealth: Payer: Self-pay

## 2024-02-02 NOTE — Telephone Encounter (Signed)
 Copied from CRM #[id number]. Topic: Clinical - Medication Question >> Feb 02, 2024 10:19 AM Vena HERO wrote: Reason for CRM: Pt states she called to her pharmacy to find out if her script for the anxiety pill had been received but they told her they have not received anything yet. Pt MRI is on Monday morning and she would like to get this resolved as soon as possible because she has a fear of enclosed spaces.  [facility] - [place], [place] - [address] [place] [address] Phone: [phone] Fax: [phone] Hours: Not open 24 hours

## 2024-02-03 ENCOUNTER — Other Ambulatory Visit: Payer: Self-pay

## 2024-02-03 DIAGNOSIS — F410 Panic disorder [episodic paroxysmal anxiety] without agoraphobia: Secondary | ICD-10-CM

## 2024-02-03 MED ORDER — DIAZEPAM 5 MG PO TABS: 5.0000 mg | ORAL_TABLET | Freq: Two times a day (BID) | ORAL | 0 refills | Status: DC | PRN

## 2024-02-03 NOTE — Telephone Encounter (Signed)
 Per patient she does not take on regular basis, just for closed in spaces due to MRI. Reach back out to patient today at [phone] if any medication can be called into her pharmacy. MRI scheduled for Monday. 9/8.

## 2024-02-06 ENCOUNTER — Ambulatory Visit (HOSPITAL_COMMUNITY)
Admission: RE | Admit: 2024-02-06 | Discharge: 2024-02-06 | Disposition: A | Discharge status: Home / Self Care | Source: Ambulatory Visit | Attending: Neurosurgery | Admitting: Neurosurgery

## 2024-02-06 DIAGNOSIS — M5440 Lumbago with sciatica, unspecified side: Secondary | ICD-10-CM | POA: Insufficient documentation

## 2024-02-06 DIAGNOSIS — M545 Low back pain, unspecified: Secondary | ICD-10-CM | POA: Diagnosis not present

## 2024-02-06 NOTE — Telephone Encounter (Signed)
 Pt advised with verbal understanding

## 2024-02-08 ENCOUNTER — Ambulatory Visit

## 2024-02-08 VITALS — BP 118/77 | HR 58 | Ht 61.0 in | Wt 155.1 lb

## 2024-02-08 DIAGNOSIS — E063 Autoimmune thyroiditis: Secondary | ICD-10-CM | POA: Diagnosis not present

## 2024-02-08 DIAGNOSIS — K219 Gastro-esophageal reflux disease without esophagitis: Secondary | ICD-10-CM

## 2024-02-08 DIAGNOSIS — Z1329 Encounter for screening for other suspected endocrine disorder: Secondary | ICD-10-CM | POA: Diagnosis not present

## 2024-02-08 MED ORDER — PANTOPRAZOLE SODIUM 40 MG PO TBEC: 40.0000 mg | DELAYED_RELEASE_TABLET | Freq: Two times a day (BID) | ORAL | 11 refills | Status: AC

## 2024-02-08 NOTE — Assessment & Plan Note (Signed)
 Recheck labs today.  If still outside of normal range, consider switching to Synthroid  branded medication given long-standing inability to get thyroid  levels regulated.  She reports taking this first thing in the morning on an empty stomach, without any other meds or supplements.  Waits a full hour before eating.

## 2024-02-08 NOTE — Assessment & Plan Note (Signed)
 Increase Protonix  to twice daily dosing based on reports of continued acid reflux symptoms.  Okay to continue with Pepcid as needed.

## 2024-02-08 NOTE — Progress Notes (Signed)
 Established Patient Office Visit  Subjective   Patient ID: Victoria Deleon, female    DOB: 1965-10-26  Age: 58 y.o. MRN: [id number]  Chief Complaint  Patient presents with   Medical Management of Chronic Issues    3 Month follow up   HPI  Patient Active Problem List   Diagnosis Date Noted   Recurrent sinusitis 09/23/2023   Other fatigue 09/23/2023   Other insomnia 08/29/2023   Early satiety 08/15/2023   Prediabetes 04/18/2023   Agoraphobia 03/24/2022   Major depressive disorder, recurrent episode, moderate (HCC) 03/24/2022   Tobacco use disorder 03/24/2022   History of 2 suicide attempts 03/24/2022   Lumbar spondylosis 09/29/2020   Low back pain 02/11/2020   History of recurrent UTIs 02/11/2020   Lichen sclerosus 02/11/2020   Vaginal atrophy 02/11/2020   Generalized anxiety disorder with panic attacks 01/25/2020   PTSD (post-traumatic stress disorder) 01/25/2020   Essential hypertension 01/25/2020   Screening mammogram, encounter for 01/25/2020   Routine cervical smear 01/25/2020   Vitamin D  deficiency 01/25/2020   Hyperlipidemia 01/25/2020   Hypothyroidism 01/25/2020   GERD (gastroesophageal reflux disease) 07/27/2018   Constipation 07/27/2018   Crohn's disease (HCC) 02/13/2018   ROS    Objective:     BP 118/77   Pulse (!) 58   Ht 5' 1 (1.549 m)   Wt 155 lb 1.9 oz (70.4 kg)   SpO2 93%   BMI 29.31 kg/m  BP Readings from Last 3 Encounters:  02/08/24 118/77  11/08/23 113/73  09/26/23 123/79   Wt Readings from Last 3 Encounters:  02/08/24 155 lb 1.9 oz (70.4 kg)  11/08/23 148 lb 12.8 oz (67.5 kg)  09/26/23 146 lb 3.2 oz (66.3 kg)      Physical Exam Vitals and nursing note reviewed.  Constitutional:      Appearance: Normal appearance.  HENT:     Head: Normocephalic.  Eyes:     Extraocular Movements: Extraocular movements intact.     Pupils: Pupils are equal, round, and reactive to light.  Cardiovascular:     Rate and Rhythm: Normal rate and  regular rhythm.  Pulmonary:     Effort: Pulmonary effort is normal.     Breath sounds: Normal breath sounds.  Musculoskeletal:     Cervical back: Normal range of motion and neck supple.  Neurological:     Mental Status: She is alert and oriented to person, place, and time.  Psychiatric:        Mood and Affect: Mood normal.        Thought Content: Thought content normal.     No results found for any visits on 02/08/24.  Last CBC Lab Results  Component Value Date   WBC 5.3 08/15/2023   HGB 14.9 08/15/2023   HCT 43.3 08/15/2023   MCV 92 08/15/2023   MCH 31.6 08/15/2023   RDW 13.6 08/15/2023   PLT 320 08/15/2023   Last metabolic panel Lab Results  Component Value Date   GLUCOSE 134 (H) 08/15/2023   NA 140 08/15/2023   K 4.4 08/15/2023   CL 101 08/15/2023   CO2 23 08/15/2023   BUN 8 08/15/2023   CREATININE 0.78 08/15/2023   EGFR 88 08/15/2023   CALCIUM  9.4 08/15/2023   PROT 6.5 08/15/2023   ALBUMIN 4.4 08/15/2023   LABGLOB 2.1 08/15/2023   AGRATIO 2.2 03/02/2021   BILITOT 0.3 08/15/2023   ALKPHOS 76 08/15/2023   AST 13 08/15/2023   ALT 10 08/15/2023   ANIONGAP  9 08/30/2019   Last lipids Lab Results  Component Value Date   CHOL 152 04/07/2022   HDL 55 04/07/2022   LDLCALC 76 04/07/2022   TRIG 118 04/07/2022   CHOLHDL 2.8 04/07/2022   Last hemoglobin A1c Lab Results  Component Value Date   HGBA1C 6.1 (H) 04/18/2023   Last thyroid  functions Lab Results  Component Value Date   TSH 35.200 (H) 11/08/2023   THYROIDAB 255 (H) 05/21/2020   Last vitamin D  Lab Results  Component Value Date   VD25OH 49.8 04/18/2023     The 10-year ASCVD risk score (Arnett DK, et al., 2019) is: 5.4%    Assessment & Plan:   Problem List Items Addressed This Visit       Digestive   GERD (gastroesophageal reflux disease)   Increase Protonix  to twice daily dosing based on reports of continued acid reflux symptoms.  Okay to continue with Pepcid as needed.         Relevant Medications   pantoprazole  (PROTONIX ) 40 MG tablet     Endocrine   Hypothyroidism - Primary   Recheck labs today.  If still outside of normal range, consider switching to Synthroid  branded medication given long-standing inability to get thyroid  levels regulated.  She reports taking this first thing in the morning on an empty stomach, without any other meds or supplements.  Waits a full hour before eating.        Relevant Orders   TSH + free T4    No follow-ups on file.    Leita Longs, FNP

## 2024-02-09 LAB — TSH+FREE T4
Free T4: 0.89 ng/dL (ref 0.82–1.77)
TSH: 29.5 u[IU]/mL — ABNORMAL HIGH (ref 0.450–4.500)

## 2024-02-10 ENCOUNTER — Encounter: Payer: Self-pay | Admitting: Internal Medicine

## 2024-02-22 ENCOUNTER — Telehealth: Payer: Self-pay

## 2024-02-22 ENCOUNTER — Ambulatory Visit: Payer: Self-pay

## 2024-02-22 ENCOUNTER — Other Ambulatory Visit: Payer: Self-pay

## 2024-02-22 DIAGNOSIS — E063 Autoimmune thyroiditis: Secondary | ICD-10-CM

## 2024-02-22 MED ORDER — LEVOTHYROXINE SODIUM 175 MCG PO TABS: 175.0000 ug | ORAL_TABLET | Freq: Every day | ORAL | 1 refills | Status: DC

## 2024-02-22 NOTE — Telephone Encounter (Signed)
 Copied from CRM #[id number]. Topic: Clinical - Lab/Test Results >> Feb 22, 2024  9:52 AM Victoria Deleon wrote: Reason for CRM: Pt called for lab results, please advise.

## 2024-03-01 DIAGNOSIS — Z6829 Body mass index (BMI) 29.0-29.9, adult: Secondary | ICD-10-CM | POA: Diagnosis not present

## 2024-03-01 DIAGNOSIS — M5440 Lumbago with sciatica, unspecified side: Secondary | ICD-10-CM | POA: Diagnosis not present

## 2024-03-07 ENCOUNTER — Encounter: Payer: Self-pay | Admitting: Internal Medicine

## 2024-03-07 ENCOUNTER — Ambulatory Visit (INDEPENDENT_AMBULATORY_CARE_PROVIDER_SITE_OTHER): Payer: Self-pay | Admitting: Internal Medicine

## 2024-03-07 VITALS — BP 116/77 | HR 60 | Ht 61.0 in | Wt 155.8 lb

## 2024-03-07 DIAGNOSIS — J0101 Acute recurrent maxillary sinusitis: Secondary | ICD-10-CM | POA: Diagnosis not present

## 2024-03-07 MED ORDER — AMOXICILLIN-POT CLAVULANATE 875-125 MG PO TABS: 1.0000 | ORAL_TABLET | Freq: Two times a day (BID) | ORAL | 0 refills | Status: DC

## 2024-03-07 NOTE — Patient Instructions (Addendum)
 Please start taking Augmentin as prescribed.  Please use nasal saline spray as needed for nasal congestion.  Please use sinus inhaler and/or vaporizer as needed for nasal congestion.

## 2024-03-07 NOTE — Progress Notes (Unsigned)
 Acute Office Visit  Subjective:    Patient ID: Victoria Deleon, female    DOB: March 25, 1966, 58 y.o.   MRN: [id number]  Chief Complaint  Patient presents with   Ear Pain    Left ear pain, sinus infections sx , feeling nauseous.     HPI Patient is in today for complaint of nasal congestion, sinus pressure related to left-sided facial pain and left ear pain for the last 1 week.  She also reports dry cough and feeling nauseous due to postnasal drip.  She has tried using nasal saline spray with mild relief.  Denies any fever or chills currently.  Denies dyspnea or wheezing currently.  She has history of recurrent acute sinusitis.  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 Shaaron: Normal terminal ileum with negative biopsies, normal-appearing colon with random colon biopsies negative, internal hemorrhoids.  Next colonoscopy 10 years.   ESOPHAGOGASTRODUODENOSCOPY (EGD) WITH PROPOFOL  N/A 04/24/2018   Dr. Shaaron: Severe ulcerative reflux esophagitis, small hiatal hernia, small bowel biopsies negative for celiac.   EYE SURGERY N/A    Phreesia 03/04/2020   JOINT  REPLACEMENT N/A    Phreesia 01/22/2020   MEDIAL PARTIAL KNEE REPLACEMENT Left    TOTAL KNEE REVISION Left 09/10/2019   Procedure: LEFT TOTAL KNEE REVISION;  Surgeon: Liam Lerner, MD;  Location: [facility];  Service: Orthopedics;  Laterality: Left;    Family History  Adopted: Yes  Problem Relation Age of Onset   Colon cancer Neg Hx     Social History   Socioeconomic History   Marital status: Married    Spouse name: Ubaldo    Number of children: Not on file   Years of education: Not on file   Highest education level: Not on file  Occupational History    Comment: medical disability   Tobacco Use   Smoking status: Every Day    Current packs/day: 1.00    Average packs/day: 1 pack/day for 35.8 years (35.8 ttl pk-yrs)    Types: Cigarettes    Start date: 8   Smokeless tobacco: Never  Vaping Use   Vaping status: Never Used  Substance and Sexual Activity   Alcohol use: Yes    Comment: rare-maybe 2 glasses of wine/year   Drug use: Not Currently    Types: Marijuana    Comment: smoked marijuana starting at age 25 in [place] . Stopped use in 2020 after moving to [place]   Sexual activity: Not Currently    Birth control/protection: Surgical    Comment: hyst  Other Topics Concern  Not on file  Social History Narrative   Lives with husband Ubaldo          Enjoys: kayaking, outside events       Diet: eats all food groups outside meat    Caffeine: tea 2 cups daily    Water : 5-6 cups daily or more      Wears seat belt   Does not use phone while driving    [employer] features editor safe area   Social Drivers of Health   Financial Resource Strain: Medium Risk (06/22/2023)   Overall Financial Resource Strain (CARDIA)    Difficulty of Paying Living Expenses: Somewhat hard  Food Insecurity: Patient Declined (06/22/2023)   Hunger Vital Sign    Worried About Running Out of Food in the Last Year: Patient declined    Ran Out of Food in the Last Year: Patient declined   Transportation Needs: No Transportation Needs (06/22/2023)   PRAPARE - Administrator, Civil Service (Medical): No    Lack of Transportation (Non-Medical): No  Physical Activity: Insufficiently Active (06/22/2023)   Exercise Vital Sign    Days of Exercise per Week: 3 days    Minutes of Exercise per Session: 30 min  Stress: Stress Concern Present (06/22/2023)   [employer] of Occupational Health - Occupational Stress Questionnaire    Feeling of Stress : To some extent  Social Connections: Unknown (06/22/2023)   Social Connection and Isolation Panel    Frequency of Communication with Friends and Family: Patient declined    Frequency of Social Gatherings with Friends and Family: Patient declined    Attends Religious Services: Patient declined    Database administrator or Organizations: Patient declined    Attends Banker Meetings: Patient declined    Marital Status: Married  Catering manager Violence: Not At Risk (06/22/2023)   Humiliation, Afraid, Rape, and Kick questionnaire    Fear of Current or Ex-Partner: No    Emotionally Abused: No    Physically Abused: No    Sexually Abused: No    Outpatient Medications Prior to Visit  Medication Sig Dispense Refill   atorvastatin  (LIPITOR) 40 MG tablet Take 1 tablet (40 mg total) by mouth daily. 90 tablet 3   diazepam  (VALIUM ) 5 MG tablet Take 1 tablet (5 mg total) by mouth every 12 (twelve) hours as needed for anxiety (take 30 minutes prior to procedure). 2 tablet 0   DULoxetine  (CYMBALTA ) 60 MG capsule Take 2 capsules (120 mg total) by mouth daily. 60 capsule 5   EPINEPHrine  0.3 mg/0.3 mL IJ SOAJ injection Inject 0.3 mg into the muscle as needed for anaphylaxis. 1 each 2   Evolocumab  (REPATHA  SURECLICK) 140 MG/ML SOAJ Inject 140 mg into the skin every 14 (fourteen) days. 6 mL 3   FIBER PO Take 1 capsule by mouth 2 (two) times daily.      levothyroxine  (SYNTHROID ) 175 MCG tablet Take 1 tablet (175 mcg total) by  mouth daily. 90 tablet 1   linaclotide  (LINZESS ) 145 MCG CAPS capsule Take 1 capsule (145 mcg total) by mouth daily before breakfast. 30 capsule 11   pantoprazole  (PROTONIX ) 40 MG tablet Take 1 tablet (40 mg total) by mouth 2 (two) times daily before a meal. 60 tablet 11   propranolol  (INDERAL ) 10 MG tablet Take 1 tablet (10 mg total) by mouth 2 (two) times daily as needed (anxiety). 60 tablet 5   traZODone  (DESYREL ) 100 MG  tablet Take a full tablet nightly before bed.  You can take half a tablet as needed during the night. 135 tablet 3   No facility-administered medications prior to visit.    Allergies  Allergen Reactions   Codeine Anaphylaxis    Review of Systems  Constitutional:  Negative for chills and fever.  HENT:  Positive for congestion, postnasal drip, sinus pressure and sinus pain.   Eyes:  Negative for pain and discharge.  Respiratory:  Positive for cough. Negative for shortness of breath.   Cardiovascular:  Negative for chest pain and palpitations.  Gastrointestinal:  Positive for nausea. Negative for abdominal pain, diarrhea and vomiting.  Endocrine: Negative for polydipsia and polyuria.  Genitourinary:  Negative for dysuria and hematuria.  Musculoskeletal:  Negative for neck pain and neck stiffness.  Skin:  Negative for rash.  Neurological:  Negative for dizziness and weakness.  Psychiatric/Behavioral:  Negative for agitation and behavioral problems.        Objective:    Physical Exam Vitals reviewed.  Constitutional:      General: She is not in acute distress.    Appearance: She is not diaphoretic.  HENT:     Head: Normocephalic and atraumatic.     Right Ear: Ear canal normal. There is no impacted cerumen.     Left Ear: Ear canal normal. There is no impacted cerumen.     Nose: Congestion present.     Right Sinus: No maxillary sinus tenderness.     Left Sinus: Maxillary sinus tenderness present.     Mouth/Throat:     Mouth: Mucous membranes are moist.   Eyes:     General: No scleral icterus.    Extraocular Movements: Extraocular movements intact.  Cardiovascular:     Rate and Rhythm: Normal rate and regular rhythm.     Heart sounds: Normal heart sounds. No murmur heard. Pulmonary:     Breath sounds: Normal breath sounds. No wheezing or rales.  Musculoskeletal:     Cervical back: Neck supple. No tenderness.     Right lower leg: No edema.     Left lower leg: No edema.  Skin:    General: Skin is warm.     Findings: No rash.  Neurological:     General: No focal deficit present.     Mental Status: She is alert and oriented to person, place, and time.  Psychiatric:        Mood and Affect: Mood normal.        Behavior: Behavior normal.     BP 116/77   Pulse 60   Ht 5' 1 (1.549 m)   Wt 155 lb 12.8 oz (70.7 kg)   SpO2 98%   BMI 29.44 kg/m  Wt Readings from Last 3 Encounters:  03/07/24 155 lb 12.8 oz (70.7 kg)  02/08/24 155 lb 1.9 oz (70.4 kg)  11/08/23 148 lb 12.8 oz (67.5 kg)        Assessment & Plan:   Problem List Items Addressed This Visit       Respiratory   Acute recurrent maxillary sinusitis - Primary   Nasal congestion, postnasal drip and left-sided ear pain likely due to acute maxillary sinusitis Started empiric Augmentin Continue Ocean Spray for nasal congestion Advised to use sinus inhaler and/or vaporizer for nasal congestion Mucinex or Robitussin as needed for cough      Relevant Medications   amoxicillin-clavulanate (AUGMENTIN) 875-125 MG tablet     Meds ordered this encounter  Medications   amoxicillin-clavulanate (  AUGMENTIN) 875-125 MG tablet    Sig: Take 1 tablet by mouth 2 (two) times daily.    Dispense:  14 tablet    Refill:  0     Romelo Sciandra MARLA Blanch, MD

## 2024-03-08 NOTE — Assessment & Plan Note (Signed)
 Nasal congestion, postnasal drip and left-sided ear pain likely due to acute maxillary sinusitis Started empiric Augmentin Continue Ocean Spray for nasal congestion Advised to use sinus inhaler and/or vaporizer for nasal congestion Mucinex or Robitussin as needed for cough

## 2024-03-20 ENCOUNTER — Other Ambulatory Visit: Payer: Self-pay

## 2024-03-20 NOTE — Telephone Encounter (Signed)
 The increased dose of levothyroxine  was sent to [facility] on 02/22/2024

## 2024-03-24 ENCOUNTER — Other Ambulatory Visit (HOSPITAL_BASED_OUTPATIENT_CLINIC_OR_DEPARTMENT_OTHER): Payer: Self-pay | Admitting: Nurse Practitioner

## 2024-03-24 DIAGNOSIS — I700 Atherosclerosis of aorta: Secondary | ICD-10-CM

## 2024-03-24 DIAGNOSIS — E785 Hyperlipidemia, unspecified: Secondary | ICD-10-CM

## 2024-03-24 DIAGNOSIS — E7841 Elevated Lipoprotein(a): Secondary | ICD-10-CM

## 2024-03-24 DIAGNOSIS — I2510 Atherosclerotic heart disease of native coronary artery without angina pectoris: Secondary | ICD-10-CM

## 2024-03-28 ENCOUNTER — Encounter: Payer: Self-pay | Admitting: Internal Medicine

## 2024-03-28 DIAGNOSIS — E785 Hyperlipidemia, unspecified: Secondary | ICD-10-CM

## 2024-03-28 DIAGNOSIS — E7841 Elevated Lipoprotein(a): Secondary | ICD-10-CM

## 2024-03-28 DIAGNOSIS — I700 Atherosclerosis of aorta: Secondary | ICD-10-CM

## 2024-03-28 DIAGNOSIS — Z5181 Encounter for therapeutic drug level monitoring: Secondary | ICD-10-CM

## 2024-03-28 DIAGNOSIS — I2510 Atherosclerotic heart disease of native coronary artery without angina pectoris: Secondary | ICD-10-CM

## 2024-03-28 NOTE — Telephone Encounter (Signed)
 Called  patient . Left message--   you are do to see Dr  Mona or Rosaline Bane NP to  (f/u  cholesterol; lipid clinic)     Schedule an appt with michelle NP for 04/18/24 for the lipid clinic at [facility].   Order placed for NMR ,lipopfile. Please do lab at least 1 week prior to appt on 04/18/24.   If you are able to keep appt pleas call to reschedule.  Message sent via mychart.

## 2024-04-02 DIAGNOSIS — M5416 Radiculopathy, lumbar region: Secondary | ICD-10-CM | POA: Diagnosis not present

## 2024-04-02 DIAGNOSIS — M5441 Lumbago with sciatica, right side: Secondary | ICD-10-CM | POA: Diagnosis not present

## 2024-04-02 DIAGNOSIS — M48061 Spinal stenosis, lumbar region without neurogenic claudication: Secondary | ICD-10-CM | POA: Diagnosis not present

## 2024-04-12 ENCOUNTER — Other Ambulatory Visit (HOSPITAL_BASED_OUTPATIENT_CLINIC_OR_DEPARTMENT_OTHER): Payer: Self-pay

## 2024-04-12 DIAGNOSIS — Z5181 Encounter for therapeutic drug level monitoring: Secondary | ICD-10-CM

## 2024-04-12 DIAGNOSIS — I2510 Atherosclerotic heart disease of native coronary artery without angina pectoris: Secondary | ICD-10-CM

## 2024-04-12 DIAGNOSIS — E7841 Elevated Lipoprotein(a): Secondary | ICD-10-CM

## 2024-04-12 DIAGNOSIS — E785 Hyperlipidemia, unspecified: Secondary | ICD-10-CM

## 2024-04-12 DIAGNOSIS — I700 Atherosclerosis of aorta: Secondary | ICD-10-CM

## 2024-04-13 LAB — NMR, LIPOPROFILE
Cholesterol, Total: 155 mg/dL (ref 100–199)
HDL Particle Number: 34.6 umol/L (ref >=30.5)
HDL-C: 55 mg/dL (ref >39)
LDL Particle Number: 802 nmol/L (ref <1000)
LDL Size: 21.3 nm (ref >20.5)
LDL-C (NIH Calc): 83 mg/dL (ref 0–99)
LP-IR Score: <25 (ref <=45)
Small LDL Particle Number: 392 nmol/L (ref <=527)
Triglycerides: 93 mg/dL (ref 0–149)

## 2024-04-15 ENCOUNTER — Other Ambulatory Visit: Payer: Self-pay | Admitting: Internal Medicine

## 2024-04-16 ENCOUNTER — Ambulatory Visit (HOSPITAL_BASED_OUTPATIENT_CLINIC_OR_DEPARTMENT_OTHER): Payer: Self-pay | Admitting: Nurse Practitioner

## 2024-04-18 ENCOUNTER — Encounter (HOSPITAL_BASED_OUTPATIENT_CLINIC_OR_DEPARTMENT_OTHER): Admitting: Nurse Practitioner

## 2024-05-01 NOTE — Progress Notes (Unsigned)
 Cardiology Office Note:  .   Date:  05/01/2024  ID:  Victoria Deleon, DOB 05-04-1966, MRN [id number] PCP: Bevely Doffing, FNP  [facility] Providers Cardiologist:  Lurena MARLA Red, MD    Patient Profile: .      PMH Elevated LP(a) Coronary artery disease Coronary CTA 09/2021 CAC score 11 (81st percentile) Nonobstructive CAD with calcified plaque in prox LAD (0-24%) stenosis Dyslipidemia Echocardiogram 10/08/2021 Normal LVEF 55-60, no rwma, normal RV, mild calcification of aortic valve with no stenosis Normal ABIs on vascular testing 10/14/2021 GAD PTSD Adopted - does not know family history of heart disease   Referred to Advanced lipid disorder clinic and seen by Dr. Mona 03/01/2023 for elevated LP(a).  She had seen Dr. Red for chest discomfort and underwent coronary CT angiography May 2023 which demonstrated no obstructive CAD with minimal plaque in proximal LAD, calcium  score of 11, 81st percentile.  She had significantly elevated lipid profile with LDL of 251.  She was started on atorvastatin  80 mg daily with improvement and total cholesterol 152, triglycerides 118, HDL 55, and LDL 76.  However subsequently she had been working with a provider in [place] who stopped all of her medications in order to wash out her system.  The plan was to reestablish some medications as needed based on repeat testing.  She reported feeling significantly better since coming off some of the medications.  LP(a) was significantly elevated at 368.7 nmol/L.  The upcoming clinical trial for primary prevention of LP(a) lowering with a specific inhibitor was discussed and she voiced interest. She was scheduled to undergo repeat testing about 1 week later with provider in [place].  She called later to request Dr. Mona to order labs.  NMR completed 03/15/2023 revealed LDL particle number 2599, LDL-C 237, HDL-C 54, triglycerides 145, total cholesterol 318, small LDL-P 794. She was advised to resume  atorvastatin  40 mg daily and get repeat lipid panel in 3 months. Plan to consider adding Repatha  if LDL not at goal.   Seen in clinic by me on 08/31/23 reporting concern about the effect her elevated cholesterol could have on her heart. Reported compliance with atorvastatin  despite participating in a medication washout trial. Follows a pescatarian diet and an active lifestyle, which includes swimming three days a week, home exercises, and gardening. She helps care for 5 young grandchildren as well, is on her feet for 12 plus hours a day. Reports fluctuations in her thyroid  levels, requiring changes in medication like a roller coaster.  No concerning cardiac symptoms such as chest pain, shortness of breath, or palpitations, but does note experiencing stress and anxiety.   Diet (pescatarian) Veggie shake Salad or tuna or salmon White fish and vegetables Fruits or veggies for snacks Tea Very limited sugar   Activity Swims at [employer] or at home 3 days per week  45 minutes home exercises for strength training and stretching due to back pain Raising 5 grandchildren, gardening, house and yard work   Lipid panel completed 04/12/24 revealed LDL particle #802, LDL-C 83, HDL-C 55, triglycerides 93, total cholesterol 155, and small LDL-P 392.        History of Present Illness: .   Victoria Deleon is a very pleasant 58 y.o. female who is here today for follow-up of dyslipidemia.     Recovering from back surgery, stretches and walking   Discussed the use of AI scribe software for clinical note transcription with the patient, who gave verbal consent to proceed.  ROS: See HPI       Studies Reviewed: SABRA         Lipoprotein (a)  Date/Time Value Ref Range Status  04/07/2022 10:27 AM 368.7 (H) <75.0 nmol/L Final    Comment:    **Results verified by repeat testing** Note:  Values greater than or equal to 75.0 nmol/L may        indicate an independent risk factor for CHD,        but must be evaluated  with caution when applied        to non-Caucasian populations due to the        influence of genetic factors on Lp(a) across        ethnicities.      Risk Assessment/Calculations:     No BP recorded.  {Refresh Note OR Click here to enter BP  :1}***       Physical Exam:   VS:  There were no vitals taken for this visit.   Wt Readings from Last 3 Encounters:  03/07/24 155 lb 12.8 oz (70.7 kg)  02/08/24 155 lb 1.9 oz (70.4 kg)  11/08/23 148 lb 12.8 oz (67.5 kg)    GEN: Well nourished, well developed in no acute distress NECK: No JVD; No carotid bruits CARDIAC: RRR, no murmurs, rubs, gallops RESPIRATORY:  Clear to auscultation without rales, wheezing or rhonchi  ABDOMEN: Soft, non-tender, non-distended EXTREMITIES:  No edema; No deformity     ASSESSMENT AND PLAN: .     Dyslipidemia LDL goal < 70/Possible FH: History of significantly elevated LDL which improved to 80 on atorvastatin  previously. LDL spiked again at 237 on most recent lipid panel 03/15/2023 at which time she had stopped atorvastatin  but not due to intolerance. She was advised to resume atorvastatin . We need repeat lipid panel to evaluate its effectiveness.  Possible familial hyperlipidemia, she does not know family history because she is adopted.  She also has elevated LP(a).  We will get repeat lipid panel in the next few days when she can return fasting. She already eats a healthy diet and remains very physically active. We discussed potentially adding PCSK9 inhibitor or Leqvio if LDL remains above goal.  Liver function normal on labs completed 08/15/2023.  CAD: Coronary CT 10/07/2021 with minimal nonobstructive CAD (0 to 24%) and CAC score of 11 (81st percentile). EKG today reveals NSR with no concerning ST Abnormality. She denies chest pain, dyspnea, or other symptoms concerning for angina.  No indication for further ischemic evaluation at this time.   Elevated LP(a): 368.7 on 04/07/2022.  Thankfully, no significant coronary  artery disease on CT 09/2021.  Consider adding PCSK9i or siRNA if LDL remains elevated as noted above.       Disposition: ***  Signed, Rosaline Bane, NP-C

## 2024-05-09 ENCOUNTER — Ambulatory Visit (INDEPENDENT_AMBULATORY_CARE_PROVIDER_SITE_OTHER): Admitting: Nurse Practitioner

## 2024-05-09 ENCOUNTER — Encounter (HOSPITAL_BASED_OUTPATIENT_CLINIC_OR_DEPARTMENT_OTHER): Payer: Self-pay | Admitting: Nurse Practitioner

## 2024-05-09 VITALS — BP 132/74 | HR 60 | Ht 61.0 in | Wt 158.0 lb

## 2024-05-09 DIAGNOSIS — Z7189 Other specified counseling: Secondary | ICD-10-CM | POA: Diagnosis not present

## 2024-05-09 DIAGNOSIS — E7841 Elevated Lipoprotein(a): Secondary | ICD-10-CM

## 2024-05-09 DIAGNOSIS — E7849 Other hyperlipidemia: Secondary | ICD-10-CM | POA: Diagnosis not present

## 2024-05-09 DIAGNOSIS — E785 Hyperlipidemia, unspecified: Secondary | ICD-10-CM

## 2024-05-09 DIAGNOSIS — I2510 Atherosclerotic heart disease of native coronary artery without angina pectoris: Secondary | ICD-10-CM | POA: Diagnosis not present

## 2024-05-09 NOTE — Patient Instructions (Signed)
 Medication Instructions:   Your physician recommends that you continue on your current medications as directed. Please refer to the Current Medication list given to you today.   *If you need a refill on your cardiac medications before your next appointment, please call your pharmacy*  Lab Work:  None ordered.  If you have labs (blood work) drawn today and your tests are completely normal, you will receive your results only by: MyChart Message (if you have MyChart) OR A paper copy in the mail If you have any lab test that is abnormal or we need to change your treatment, we will call you to review the results.  Testing/Procedures:  None ordered.  Follow-Up: At [facility], you and your health needs are our priority.  As part of our continuing mission to provide you with exceptional heart care, our providers are all part of one team.  This team includes your primary Cardiologist (physician) and Advanced Practice Providers or APPs (Physician Assistants and Nurse Practitioners) who all work together to provide you with the care you need, when you need it.  Your next appointment:   1 year(s)  Provider:   Slater Duncan, NP    We recommend signing up for the patient portal called "MyChart".  Sign up information is provided on this After Visit Summary.  MyChart is used to connect with patients for Virtual Visits (Telemedicine).  Patients are able to view lab/test results, encounter notes, upcoming appointments, etc.  Non-urgent messages can be sent to your provider as well.   To learn more about what you can do with MyChart, go to [email or web address].   Other Instructions  Your physician wants you to follow-up in: 1 year.  You will receive a reminder letter in the mail two months in advance. If you don't receive a letter, please call our office to schedule the follow-up appointment.

## 2024-05-11 ENCOUNTER — Encounter (HOSPITAL_BASED_OUTPATIENT_CLINIC_OR_DEPARTMENT_OTHER): Payer: Self-pay | Admitting: Nurse Practitioner

## 2024-05-22 ENCOUNTER — Ambulatory Visit (HOSPITAL_COMMUNITY)

## 2024-06-13 ENCOUNTER — Ambulatory Visit

## 2024-06-13 VITALS — BP 121/76 | HR 63 | Resp 16 | Ht 61.0 in | Wt 160.0 lb

## 2024-06-13 DIAGNOSIS — R110 Nausea: Secondary | ICD-10-CM

## 2024-06-13 DIAGNOSIS — E559 Vitamin D deficiency, unspecified: Secondary | ICD-10-CM | POA: Diagnosis not present

## 2024-06-13 DIAGNOSIS — Z789 Other specified health status: Secondary | ICD-10-CM

## 2024-06-13 DIAGNOSIS — Z1231 Encounter for screening mammogram for malignant neoplasm of breast: Secondary | ICD-10-CM | POA: Diagnosis not present

## 2024-06-13 DIAGNOSIS — E063 Autoimmune thyroiditis: Secondary | ICD-10-CM | POA: Diagnosis not present

## 2024-06-13 DIAGNOSIS — R7303 Prediabetes: Secondary | ICD-10-CM

## 2024-06-13 DIAGNOSIS — I10 Essential (primary) hypertension: Secondary | ICD-10-CM

## 2024-06-13 NOTE — Progress Notes (Signed)
 "  Established Patient Office Visit  Subjective   Patient ID: Victoria Deleon, female    DOB: July 22, 1965  Age: 59 y.o. MRN: [id number]  Chief Complaint  Patient presents with   Thyroid  Problem    Started having nausea before Christmas, happens a few days a week. Episodes cause nausea, vomiting, no appetite, headache, shakiness. Is thinking its due to her thyroid      HPI Discussed the use of AI scribe software for clinical note transcription with the patient, who gave verbal consent to proceed.  History of Present Illness    Victoria Deleon is a 59 year old female who presents with nausea, vomiting, and night sweats.  Gastrointestinal symptoms - Intermittent nausea and vomiting ongoing since before Christmas - Nausea often accompanied by headaches and night sweats - Symptoms are cyclical, with some days better than others - Temporary relief of nausea with crackers or small amounts of food, but symptoms persist - Regular constipation managed with Linzess   Night sweats and headaches - Night sweats described as 'dripping sweat', occurring with nausea - Headaches frequently accompany nausea and night sweats  Sinus congestion and sensitivity - Sinus congestion and sensitivity, particularly in the mornings - Uses a humidifier for symptomatic relief  Medication and recent changes - History of thyroid  issues; recent adjustment of thyroid  medication without improvement in symptoms - Stopped Cymbalta  (duloxetine ) after back surgery, suspecting it may contribute to symptoms - No new medications started - History of back surgery right before Thanksgiving  Dietary habits - Consistent diet: morning smoothie with fruit, mid-afternoon salads - Lifelong vegetarian - Incorporates high-protein foods such as avocado, lentils, and beans  Psychological and musculoskeletal symptoms - History of anxiety and arthritis - Manages anxiety and pain with lifestyle choices and occasional cannabis  gummies, especially in social situations - Does not believe cannabis use is related to nausea  Impact on daily life - Frustrated by inconsistency of symptoms and their impact on daily activities     Patient Active Problem List   Diagnosis Date Noted   Vegetarian diet 06/15/2024   Nausea 06/15/2024   Acute recurrent maxillary sinusitis 03/07/2024   Recurrent sinusitis 09/23/2023   Other fatigue 09/23/2023   Other insomnia 08/29/2023   Early satiety 08/15/2023   Prediabetes 04/18/2023   Agoraphobia 03/24/2022   Major depressive disorder, recurrent episode, moderate (HCC) 03/24/2022   Tobacco use disorder 03/24/2022   History of 2 suicide attempts 03/24/2022   Lumbar spondylosis 09/29/2020   Low back pain 02/11/2020   History of recurrent UTIs 02/11/2020   Lichen sclerosus 02/11/2020   Vaginal atrophy 02/11/2020   Generalized anxiety disorder with panic attacks 01/25/2020   PTSD (post-traumatic stress disorder) 01/25/2020   Essential hypertension 01/25/2020   Screening mammogram, encounter for 01/25/2020   Routine cervical smear 01/25/2020   Vitamin D  deficiency 01/25/2020   Hyperlipidemia 01/25/2020   Hypothyroidism 01/25/2020   GERD (gastroesophageal reflux disease) 07/27/2018   Constipation 07/27/2018   Crohn's disease (HCC) 02/13/2018    ROS    Objective:     BP 121/76   Pulse 63   Resp 16   Ht 5' 1 (1.549 m)   Wt 160 lb (72.6 kg)   SpO2 91%   BMI 30.23 kg/m  BP Readings from Last 3 Encounters:  06/13/24 121/76  05/09/24 132/74  03/07/24 116/77   Wt Readings from Last 3 Encounters:  06/13/24 160 lb (72.6 kg)  05/09/24 158 lb (71.7 kg)  03/07/24 155 lb 12.8 oz (70.7  kg)     Physical Exam Vitals and nursing note reviewed.  Constitutional:      Appearance: Normal appearance. She is obese.  HENT:     Head: Normocephalic.  Eyes:     Extraocular Movements: Extraocular movements intact.     Pupils: Pupils are equal, round, and reactive to light.   Cardiovascular:     Rate and Rhythm: Normal rate and regular rhythm.  Pulmonary:     Effort: Pulmonary effort is normal.     Breath sounds: Normal breath sounds.  Musculoskeletal:     Cervical back: Normal range of motion and neck supple.  Neurological:     Mental Status: She is alert and oriented to person, place, and time.  Psychiatric:        Mood and Affect: Mood normal.        Thought Content: Thought content normal.       The 10-year ASCVD risk score (Arnett DK, et al., 2019) is: 6.2%    Assessment & Plan:   Problem List Items Addressed This Visit       Cardiovascular and Mediastinum   Essential hypertension - Primary   Remains normotensive off antihypertensive medication.      Relevant Orders   CMP14+EGFR (Completed)     Endocrine   Hypothyroidism   Consistently high TSH levels. Symptoms may relate to thyroid  dysfunction. - Ordered blood work for thyroid  levels.      Relevant Orders   TSH+T4F+T3Free (Completed)     Other   Vitamin D  deficiency   Update vitamin D  level.       Relevant Orders   Vitamin D  (25 hydroxy) (Completed)   Prediabetes   Potential blood sugar fluctuations contributing to symptoms. No home glucose monitoring. - Ordered blood work for blood sugar levels. - Checked A1c for diabetes assessment.      Relevant Orders   CMP14+EGFR (Completed)   HgB A1c (Completed)   Vegetarian diet   - Advised dietary modifications to include more protein and regular meals. - check B12 level      Relevant Orders   B12 and Folate Panel (Completed)   Nausea   Symptoms are cyclical with no new medications or dietary changes.  - Ordered blood work for thyroid  levels and blood sugar. - Checked A1c for diabetes assessment. - Advised dietary modifications to include more protein and regular meals.      Other Visit Diagnoses       Encounter for screening mammogram for malignant neoplasm of breast       Relevant Orders   MM 3D SCREENING  MAMMOGRAM BILATERAL BREAST       Return in about 6 months (around 12/11/2024) for chronic follow-up with PCP, as needed.    Leita Longs, FNP  "

## 2024-06-14 LAB — TSH+T4F+T3FREE
Free T4: 0.67 ng/dL — ABNORMAL LOW (ref 0.82–1.77)
T3, Free: 2.1 pg/mL (ref 2.0–4.4)
TSH: 30.2 u[IU]/mL — ABNORMAL HIGH (ref 0.450–4.500)

## 2024-06-14 LAB — B12 AND FOLATE PANEL
Folate: 15.2 ng/mL
Vitamin B-12: 648 pg/mL (ref 232–1245)

## 2024-06-14 LAB — CMP14+EGFR
ALT: 13 IU/L (ref 0–32)
AST: 17 IU/L (ref 0–40)
Albumin: 4.6 g/dL (ref 3.8–4.9)
Alkaline Phosphatase: 66 IU/L (ref 49–135)
BUN/Creatinine Ratio: 10 (ref 9–23)
BUN: 9 mg/dL (ref 6–24)
Bilirubin Total: 0.5 mg/dL (ref 0.0–1.2)
CO2: 22 mmol/L (ref 20–29)
Calcium: 9.4 mg/dL (ref 8.7–10.2)
Chloride: 100 mmol/L (ref 96–106)
Creatinine, Ser: 0.88 mg/dL (ref 0.57–1.00)
Globulin, Total: 2 g/dL (ref 1.5–4.5)
Glucose: 100 mg/dL — ABNORMAL HIGH (ref 70–99)
Potassium: 4.4 mmol/L (ref 3.5–5.2)
Sodium: 139 mmol/L (ref 134–144)
Total Protein: 6.6 g/dL (ref 6.0–8.5)
eGFR: 76 mL/min/1.73

## 2024-06-14 LAB — HEMOGLOBIN A1C
Est. average glucose Bld gHb Est-mCnc: 126 mg/dL
Hgb A1c MFr Bld: 6 % — ABNORMAL HIGH (ref 4.8–5.6)

## 2024-06-14 LAB — VITAMIN D 25 HYDROXY (VIT D DEFICIENCY, FRACTURES): Vit D, 25-Hydroxy: 34.6 ng/mL (ref 30.0–100.0)

## 2024-06-15 ENCOUNTER — Other Ambulatory Visit: Payer: Self-pay

## 2024-06-15 ENCOUNTER — Ambulatory Visit: Payer: Self-pay

## 2024-06-15 DIAGNOSIS — Z789 Other specified health status: Secondary | ICD-10-CM | POA: Insufficient documentation

## 2024-06-15 DIAGNOSIS — R110 Nausea: Secondary | ICD-10-CM | POA: Insufficient documentation

## 2024-06-15 DIAGNOSIS — E063 Autoimmune thyroiditis: Secondary | ICD-10-CM

## 2024-06-15 MED ORDER — SYNTHROID 175 MCG PO TABS: 175.0000 ug | ORAL_TABLET | Freq: Every day | ORAL | 3 refills | Status: DC

## 2024-06-15 NOTE — Assessment & Plan Note (Signed)
Update vitamin D level. 

## 2024-06-15 NOTE — Assessment & Plan Note (Signed)
 Remains normotensive off antihypertensive medication.

## 2024-06-15 NOTE — Assessment & Plan Note (Signed)
 Consistently high TSH levels. Symptoms may relate to thyroid  dysfunction. - Ordered blood work for thyroid  levels.

## 2024-06-15 NOTE — Assessment & Plan Note (Signed)
 Potential blood sugar fluctuations contributing to symptoms. No home glucose monitoring. - Ordered blood work for blood sugar levels. - Checked A1c for diabetes assessment.

## 2024-06-15 NOTE — Assessment & Plan Note (Signed)
-   Advised dietary modifications to include more protein and regular meals. - check B12 level

## 2024-06-15 NOTE — Assessment & Plan Note (Signed)
 Symptoms are cyclical with no new medications or dietary changes.  - Ordered blood work for thyroid  levels and blood sugar. - Checked A1c for diabetes assessment. - Advised dietary modifications to include more protein and regular meals.

## 2024-06-20 ENCOUNTER — Other Ambulatory Visit: Payer: Self-pay

## 2024-06-20 DIAGNOSIS — E063 Autoimmune thyroiditis: Secondary | ICD-10-CM

## 2024-06-20 MED ORDER — SYNTHROID 175 MCG PO TABS: 175.0000 ug | ORAL_TABLET | Freq: Every day | ORAL | 3 refills | Status: DC

## 2024-06-25 ENCOUNTER — Ambulatory Visit: Payer: Medicare HMO

## 2024-06-25 ENCOUNTER — Ambulatory Visit (HOSPITAL_COMMUNITY)

## 2024-06-28 ENCOUNTER — Ambulatory Visit (HOSPITAL_COMMUNITY)
Admission: RE | Admit: 2024-06-28 | Discharge: 2024-06-28 | Disposition: A | Discharge status: Home / Self Care | Source: Ambulatory Visit

## 2024-06-28 ENCOUNTER — Encounter (HOSPITAL_COMMUNITY): Payer: Self-pay

## 2024-06-28 DIAGNOSIS — Z1231 Encounter for screening mammogram for malignant neoplasm of breast: Secondary | ICD-10-CM | POA: Insufficient documentation

## 2024-07-04 ENCOUNTER — Other Ambulatory Visit: Payer: Self-pay

## 2024-07-04 MED ORDER — LEVOTHYROXINE SODIUM 175 MCG PO TABS: 175.0000 ug | ORAL_TABLET | Freq: Every day | ORAL | 3 refills | Status: AC

## 2024-07-12 ENCOUNTER — Ambulatory Visit (HOSPITAL_COMMUNITY)

## 2024-08-10 ENCOUNTER — Ambulatory Visit

## 2024-12-12 ENCOUNTER — Ambulatory Visit: Payer: Self-pay
